# Patient Record
Sex: Female | Born: 1950
Health system: Southern US, Community
[De-identification: ages and names within clinical notes are randomized; demographics above are authoritative.]

## PROBLEM LIST (undated history)

## (undated) ENCOUNTER — Emergency Department (HOSPITAL_COMMUNITY): Admission: EM | Payer: Medicare Other | Source: Home / Self Care

## (undated) DIAGNOSIS — I739 Peripheral vascular disease, unspecified: Secondary | ICD-10-CM

## (undated) DIAGNOSIS — T7840XA Allergy, unspecified, initial encounter: Secondary | ICD-10-CM

## (undated) DIAGNOSIS — N189 Chronic kidney disease, unspecified: Secondary | ICD-10-CM

## (undated) DIAGNOSIS — H913 Deaf nonspeaking, not elsewhere classified: Secondary | ICD-10-CM

## (undated) DIAGNOSIS — E785 Hyperlipidemia, unspecified: Secondary | ICD-10-CM

## (undated) DIAGNOSIS — I639 Cerebral infarction, unspecified: Principal | ICD-10-CM

## (undated) DIAGNOSIS — I10 Essential (primary) hypertension: Secondary | ICD-10-CM

## (undated) HISTORY — PX: ABDOMINAL HYSTERECTOMY: SHX81

## (undated) HISTORY — DX: Hyperlipidemia, unspecified: E78.5

## (undated) HISTORY — DX: Chronic kidney disease, unspecified: N18.9

## (undated) HISTORY — DX: Allergy, unspecified, initial encounter: T78.40XA

## (undated) HISTORY — DX: Cerebral infarction, unspecified: I63.9

## (undated) HISTORY — DX: Peripheral vascular disease, unspecified: I73.9

## (undated) HISTORY — PX: CHOLECYSTECTOMY: SHX55

## (undated) HISTORY — DX: Essential (primary) hypertension: I10

## (undated) HISTORY — DX: Deaf nonspeaking, not elsewhere classified: H91.3

---

## 1998-01-17 ENCOUNTER — Encounter: Admission: RE | Admit: 1998-01-17 | Discharge: 1998-04-17 | Payer: Self-pay | Admitting: Internal Medicine

## 1998-06-01 ENCOUNTER — Emergency Department (HOSPITAL_COMMUNITY): Admission: EM | Admit: 1998-06-01 | Discharge: 1998-06-01 | Payer: Self-pay

## 1998-07-05 ENCOUNTER — Emergency Department (HOSPITAL_COMMUNITY): Admission: EM | Admit: 1998-07-05 | Discharge: 1998-07-05 | Payer: Self-pay | Admitting: Internal Medicine

## 1998-08-11 ENCOUNTER — Encounter: Admission: RE | Admit: 1998-08-11 | Discharge: 1998-11-09 | Payer: Self-pay | Admitting: Internal Medicine

## 1998-08-25 ENCOUNTER — Emergency Department (HOSPITAL_COMMUNITY): Admission: EM | Admit: 1998-08-25 | Discharge: 1998-08-25 | Payer: Self-pay | Admitting: Emergency Medicine

## 1998-09-21 ENCOUNTER — Emergency Department (HOSPITAL_COMMUNITY): Admission: EM | Admit: 1998-09-21 | Discharge: 1998-09-21 | Payer: Self-pay | Admitting: Emergency Medicine

## 1998-12-22 ENCOUNTER — Emergency Department (HOSPITAL_COMMUNITY): Admission: EM | Admit: 1998-12-22 | Discharge: 1998-12-22 | Payer: Self-pay | Admitting: Emergency Medicine

## 1999-01-03 ENCOUNTER — Emergency Department (HOSPITAL_COMMUNITY): Admission: EM | Admit: 1999-01-03 | Discharge: 1999-01-03 | Payer: Self-pay | Admitting: Emergency Medicine

## 2000-07-23 ENCOUNTER — Encounter: Admission: RE | Admit: 2000-07-23 | Discharge: 2000-10-21 | Payer: Self-pay

## 2002-02-20 ENCOUNTER — Emergency Department (HOSPITAL_COMMUNITY): Admission: EM | Admit: 2002-02-20 | Discharge: 2002-02-20 | Payer: Self-pay | Admitting: Emergency Medicine

## 2002-02-25 ENCOUNTER — Encounter: Admission: RE | Admit: 2002-02-25 | Discharge: 2002-02-25 | Payer: Self-pay | Admitting: Internal Medicine

## 2002-02-27 ENCOUNTER — Encounter: Admission: RE | Admit: 2002-02-27 | Discharge: 2002-05-28 | Payer: Self-pay | Admitting: *Deleted

## 2002-04-03 ENCOUNTER — Encounter: Admission: RE | Admit: 2002-04-03 | Discharge: 2002-04-03 | Payer: Self-pay | Admitting: Internal Medicine

## 2002-04-06 ENCOUNTER — Ambulatory Visit (HOSPITAL_COMMUNITY): Admission: RE | Admit: 2002-04-06 | Discharge: 2002-04-06 | Payer: Self-pay | Admitting: Internal Medicine

## 2002-04-06 ENCOUNTER — Encounter: Admission: RE | Admit: 2002-04-06 | Discharge: 2002-04-06 | Payer: Self-pay | Admitting: Internal Medicine

## 2002-04-06 ENCOUNTER — Encounter: Payer: Self-pay | Admitting: Internal Medicine

## 2002-04-20 ENCOUNTER — Encounter: Admission: RE | Admit: 2002-04-20 | Discharge: 2002-04-20 | Payer: Self-pay | Admitting: Internal Medicine

## 2002-05-04 ENCOUNTER — Ambulatory Visit (HOSPITAL_COMMUNITY): Admission: RE | Admit: 2002-05-04 | Discharge: 2002-05-04 | Payer: Self-pay | Admitting: Internal Medicine

## 2002-07-07 ENCOUNTER — Encounter: Admission: RE | Admit: 2002-07-07 | Discharge: 2002-07-07 | Payer: Self-pay | Admitting: Internal Medicine

## 2002-07-08 ENCOUNTER — Encounter: Admission: RE | Admit: 2002-07-08 | Discharge: 2002-07-08 | Payer: Self-pay | Admitting: Internal Medicine

## 2002-07-15 ENCOUNTER — Encounter: Payer: Self-pay | Admitting: Internal Medicine

## 2002-07-15 ENCOUNTER — Ambulatory Visit (HOSPITAL_COMMUNITY): Admission: RE | Admit: 2002-07-15 | Discharge: 2002-07-15 | Payer: Self-pay | Admitting: Internal Medicine

## 2002-08-21 ENCOUNTER — Encounter: Admission: RE | Admit: 2002-08-21 | Discharge: 2002-08-21 | Payer: Self-pay | Admitting: Internal Medicine

## 2002-10-15 HISTORY — PX: ROTATOR CUFF REPAIR: SHX139

## 2002-12-07 ENCOUNTER — Encounter: Admission: RE | Admit: 2002-12-07 | Discharge: 2002-12-07 | Payer: Self-pay | Admitting: Internal Medicine

## 2003-02-27 ENCOUNTER — Encounter: Payer: Self-pay | Admitting: Emergency Medicine

## 2003-02-27 ENCOUNTER — Inpatient Hospital Stay (HOSPITAL_COMMUNITY): Admission: AD | Admit: 2003-02-27 | Discharge: 2003-03-02 | Payer: Self-pay | Admitting: Orthopedic Surgery

## 2003-02-27 ENCOUNTER — Encounter: Payer: Self-pay | Admitting: Orthopedic Surgery

## 2003-03-04 ENCOUNTER — Encounter: Payer: Self-pay | Admitting: Emergency Medicine

## 2003-03-04 ENCOUNTER — Emergency Department (HOSPITAL_COMMUNITY): Admission: EM | Admit: 2003-03-04 | Discharge: 2003-03-04 | Payer: Self-pay | Admitting: Emergency Medicine

## 2003-07-22 ENCOUNTER — Ambulatory Visit (HOSPITAL_COMMUNITY): Admission: RE | Admit: 2003-07-22 | Discharge: 2003-07-22 | Payer: Self-pay | Admitting: Internal Medicine

## 2003-07-22 ENCOUNTER — Encounter: Payer: Self-pay | Admitting: Internal Medicine

## 2003-08-03 ENCOUNTER — Ambulatory Visit (HOSPITAL_BASED_OUTPATIENT_CLINIC_OR_DEPARTMENT_OTHER): Admission: RE | Admit: 2003-08-03 | Discharge: 2003-08-03 | Payer: Self-pay | Admitting: Orthopedic Surgery

## 2003-08-03 ENCOUNTER — Ambulatory Visit (HOSPITAL_COMMUNITY): Admission: RE | Admit: 2003-08-03 | Discharge: 2003-08-03 | Payer: Self-pay | Admitting: Orthopedic Surgery

## 2004-07-24 ENCOUNTER — Ambulatory Visit (HOSPITAL_COMMUNITY): Admission: RE | Admit: 2004-07-24 | Discharge: 2004-07-24 | Payer: Self-pay | Admitting: Internal Medicine

## 2004-10-02 ENCOUNTER — Ambulatory Visit: Payer: Self-pay | Admitting: Family Medicine

## 2004-10-12 ENCOUNTER — Ambulatory Visit: Payer: Self-pay | Admitting: Family Medicine

## 2005-01-10 ENCOUNTER — Ambulatory Visit: Payer: Self-pay | Admitting: Family Medicine

## 2005-05-25 ENCOUNTER — Ambulatory Visit: Payer: Self-pay | Admitting: Family Medicine

## 2005-09-19 ENCOUNTER — Ambulatory Visit: Payer: Self-pay | Admitting: Family Medicine

## 2005-09-20 ENCOUNTER — Ambulatory Visit (HOSPITAL_COMMUNITY): Admission: RE | Admit: 2005-09-20 | Discharge: 2005-09-20 | Payer: Self-pay | Admitting: Family Medicine

## 2005-11-21 ENCOUNTER — Ambulatory Visit: Payer: Self-pay | Admitting: Family Medicine

## 2005-11-28 ENCOUNTER — Ambulatory Visit: Payer: Self-pay | Admitting: Family Medicine

## 2006-01-17 ENCOUNTER — Ambulatory Visit: Payer: Self-pay | Admitting: Family Medicine

## 2006-07-10 ENCOUNTER — Ambulatory Visit: Payer: Self-pay | Admitting: Family Medicine

## 2006-07-23 ENCOUNTER — Ambulatory Visit: Payer: Self-pay | Admitting: Family Medicine

## 2006-09-03 ENCOUNTER — Ambulatory Visit: Payer: Self-pay | Admitting: Family Medicine

## 2006-09-24 ENCOUNTER — Ambulatory Visit (HOSPITAL_COMMUNITY): Admission: RE | Admit: 2006-09-24 | Discharge: 2006-09-24 | Payer: Self-pay | Admitting: Family Medicine

## 2007-04-10 ENCOUNTER — Emergency Department (HOSPITAL_COMMUNITY): Admission: EM | Admit: 2007-04-10 | Discharge: 2007-04-10 | Payer: Self-pay | Admitting: Emergency Medicine

## 2007-05-08 ENCOUNTER — Ambulatory Visit: Payer: Self-pay

## 2007-05-08 ENCOUNTER — Ambulatory Visit: Payer: Self-pay | Admitting: Family Medicine

## 2007-05-08 DIAGNOSIS — E785 Hyperlipidemia, unspecified: Secondary | ICD-10-CM

## 2007-05-08 DIAGNOSIS — G43009 Migraine without aura, not intractable, without status migrainosus: Secondary | ICD-10-CM | POA: Insufficient documentation

## 2007-05-08 DIAGNOSIS — M79609 Pain in unspecified limb: Secondary | ICD-10-CM

## 2007-05-08 DIAGNOSIS — E119 Type 2 diabetes mellitus without complications: Secondary | ICD-10-CM

## 2007-05-08 DIAGNOSIS — Z8679 Personal history of other diseases of the circulatory system: Secondary | ICD-10-CM | POA: Insufficient documentation

## 2007-05-08 DIAGNOSIS — M1990 Unspecified osteoarthritis, unspecified site: Secondary | ICD-10-CM | POA: Insufficient documentation

## 2007-05-08 DIAGNOSIS — I10 Essential (primary) hypertension: Secondary | ICD-10-CM

## 2007-07-02 ENCOUNTER — Emergency Department (HOSPITAL_COMMUNITY): Admission: EM | Admit: 2007-07-02 | Discharge: 2007-07-03 | Payer: Self-pay | Admitting: Emergency Medicine

## 2007-10-02 ENCOUNTER — Ambulatory Visit (HOSPITAL_COMMUNITY): Admission: RE | Admit: 2007-10-02 | Discharge: 2007-10-02 | Payer: Self-pay | Admitting: Family Medicine

## 2007-10-02 ENCOUNTER — Encounter: Payer: Self-pay | Admitting: Family Medicine

## 2007-10-27 ENCOUNTER — Ambulatory Visit: Payer: Self-pay | Admitting: Family Medicine

## 2007-10-31 ENCOUNTER — Ambulatory Visit: Payer: Self-pay | Admitting: Family Medicine

## 2007-11-05 LAB — CONVERTED CEMR LAB
Bilirubin, Direct: 0.1 mg/dL (ref 0.0–0.3)
Creatinine,U: 104.4 mg/dL
Eosinophils Absolute: 0.1 10*3/uL (ref 0.0–0.6)
Eosinophils Relative: 1.8 % (ref 0.0–5.0)
GFR calc Af Amer: 74 mL/min
GFR calc non Af Amer: 61 mL/min
Glucose, Bld: 373 mg/dL — ABNORMAL HIGH (ref 70–99)
HCT: 38.3 % (ref 36.0–46.0)
HDL: 39.2 mg/dL (ref >39.0)
Hgb A1c MFr Bld: 13.7 % — ABNORMAL HIGH (ref 4.6–6.0)
Lymphocytes Relative: 18.4 % (ref 12.0–46.0)
MCHC: 34 g/dL (ref 30.0–36.0)
MCV: 76.5 fL — ABNORMAL LOW (ref 78.0–100.0)
Microalb Creat Ratio: 131.2 mg/g — ABNORMAL HIGH (ref 0.0–30.0)
Microalb, Ur: 13.7 mg/dL — ABNORMAL HIGH (ref 0.0–1.9)
Neutro Abs: 5.7 10*3/uL (ref 1.4–7.7)
Neutrophils Relative %: 74.2 % (ref 43.0–77.0)
Potassium: 4.8 meq/L (ref 3.5–5.1)
Sodium: 138 meq/L (ref 135–145)
WBC: 7.6 10*3/uL (ref 4.5–10.5)

## 2008-02-12 ENCOUNTER — Ambulatory Visit: Payer: Self-pay | Admitting: Family Medicine

## 2008-02-12 DIAGNOSIS — J209 Acute bronchitis, unspecified: Secondary | ICD-10-CM | POA: Insufficient documentation

## 2008-02-16 ENCOUNTER — Encounter: Payer: Self-pay | Admitting: Family Medicine

## 2008-02-16 ENCOUNTER — Telehealth (INDEPENDENT_AMBULATORY_CARE_PROVIDER_SITE_OTHER): Payer: Self-pay | Admitting: *Deleted

## 2008-03-22 ENCOUNTER — Emergency Department (HOSPITAL_COMMUNITY): Admission: EM | Admit: 2008-03-22 | Discharge: 2008-03-22 | Payer: Self-pay | Admitting: Emergency Medicine

## 2008-03-26 ENCOUNTER — Ambulatory Visit: Payer: Self-pay | Admitting: Family Medicine

## 2008-03-26 DIAGNOSIS — N3000 Acute cystitis without hematuria: Secondary | ICD-10-CM | POA: Insufficient documentation

## 2008-03-26 DIAGNOSIS — K219 Gastro-esophageal reflux disease without esophagitis: Secondary | ICD-10-CM | POA: Insufficient documentation

## 2008-04-19 ENCOUNTER — Telehealth: Payer: Self-pay | Admitting: Family Medicine

## 2008-05-24 ENCOUNTER — Telehealth: Payer: Self-pay | Admitting: Family Medicine

## 2008-06-18 ENCOUNTER — Encounter: Payer: Self-pay | Admitting: Family Medicine

## 2008-07-29 ENCOUNTER — Emergency Department (HOSPITAL_COMMUNITY): Admission: EM | Admit: 2008-07-29 | Discharge: 2008-07-29 | Payer: Self-pay | Admitting: Family Medicine

## 2008-07-31 ENCOUNTER — Inpatient Hospital Stay (HOSPITAL_COMMUNITY): Admission: EM | Admit: 2008-07-31 | Discharge: 2008-08-03 | Payer: Self-pay | Admitting: Emergency Medicine

## 2008-07-31 ENCOUNTER — Ambulatory Visit: Payer: Self-pay | Admitting: Internal Medicine

## 2008-08-05 ENCOUNTER — Telehealth: Payer: Self-pay | Admitting: Family Medicine

## 2008-08-06 ENCOUNTER — Encounter: Payer: Self-pay | Admitting: Family Medicine

## 2008-08-10 ENCOUNTER — Ambulatory Visit: Payer: Self-pay | Admitting: Family Medicine

## 2008-08-27 ENCOUNTER — Ambulatory Visit: Payer: Self-pay | Admitting: Family Medicine

## 2008-09-22 ENCOUNTER — Encounter: Payer: Self-pay | Admitting: Family Medicine

## 2008-09-24 ENCOUNTER — Ambulatory Visit: Payer: Self-pay | Admitting: Family Medicine

## 2008-10-04 ENCOUNTER — Ambulatory Visit (HOSPITAL_COMMUNITY): Admission: RE | Admit: 2008-10-04 | Discharge: 2008-10-04 | Payer: Self-pay | Admitting: Family Medicine

## 2008-10-29 ENCOUNTER — Ambulatory Visit: Payer: Self-pay | Admitting: Family Medicine

## 2008-11-10 LAB — CONVERTED CEMR LAB
ALT: 14 units/L (ref 0–35)
AST: 24 units/L (ref 0–37)
Albumin: 3.2 g/dL — ABNORMAL LOW (ref 3.5–5.2)
Alkaline Phosphatase: 65 units/L (ref 39–117)
BUN: 8 mg/dL (ref 6–23)
Eosinophils Relative: 1.7 % (ref 0.0–5.0)
GFR calc Af Amer: 95 mL/min
Glucose, Bld: 204 mg/dL — ABNORMAL HIGH (ref 70–99)
HCT: 36.7 % (ref 36.0–46.0)
Hemoglobin: 12.2 g/dL (ref 12.0–15.0)
Monocytes Absolute: 0.6 10*3/uL (ref 0.1–1.0)
Monocytes Relative: 6.9 % (ref 3.0–12.0)
Platelets: 307 10*3/uL (ref 150–400)
Potassium: 4.3 meq/L (ref 3.5–5.1)
Total CHOL/HDL Ratio: 4.2
Total Protein: 7.1 g/dL (ref 6.0–8.3)
Triglycerides: 103 mg/dL (ref 0–149)
WBC: 8.2 10*3/uL (ref 4.5–10.5)

## 2008-11-22 ENCOUNTER — Encounter: Payer: Self-pay | Admitting: Family Medicine

## 2008-12-15 ENCOUNTER — Telehealth: Payer: Self-pay | Admitting: Family Medicine

## 2008-12-17 ENCOUNTER — Encounter: Payer: Self-pay | Admitting: Family Medicine

## 2008-12-21 ENCOUNTER — Encounter: Admission: RE | Admit: 2008-12-21 | Discharge: 2008-12-21 | Payer: Self-pay | Admitting: Endocrinology

## 2009-01-07 ENCOUNTER — Telehealth: Payer: Self-pay | Admitting: Family Medicine

## 2009-02-08 ENCOUNTER — Emergency Department (HOSPITAL_COMMUNITY): Admission: EM | Admit: 2009-02-08 | Discharge: 2009-02-08 | Payer: Self-pay | Admitting: Emergency Medicine

## 2009-02-09 ENCOUNTER — Ambulatory Visit: Payer: Self-pay | Admitting: Family Medicine

## 2009-02-09 LAB — CONVERTED CEMR LAB
Blood in Urine, dipstick: NEGATIVE
Nitrite: NEGATIVE
Urobilinogen, UA: 0.2

## 2009-02-11 LAB — CONVERTED CEMR LAB
ALT: 14 units/L (ref 0–35)
AST: 26 units/L (ref 0–37)
Alkaline Phosphatase: 84 units/L (ref 39–117)
Calcium: 9 mg/dL (ref 8.4–10.5)
Creatinine,U: 160.8 mg/dL
Eosinophils Relative: 1 % (ref 0.0–5.0)
GFR calc non Af Amer: 78.51 mL/min (ref >60)
HCT: 38.4 % (ref 36.0–46.0)
HDL: 48.4 mg/dL (ref >39.00)
Hemoglobin: 13 g/dL (ref 12.0–15.0)
Lymphocytes Relative: 19.6 % (ref 12.0–46.0)
Lymphs Abs: 2 10*3/uL (ref 0.7–4.0)
Microalb Creat Ratio: 257.5 mg/g — ABNORMAL HIGH (ref 0.0–30.0)
Monocytes Relative: 5.8 % (ref 3.0–12.0)
Neutro Abs: 7.5 10*3/uL (ref 1.4–7.7)
Platelets: 342 10*3/uL (ref 150.0–400.0)
Potassium: 3.9 meq/L (ref 3.5–5.1)
Sodium: 145 meq/L (ref 135–145)
TSH: 1.53 microintl units/mL (ref 0.35–5.50)
Total Bilirubin: 1 mg/dL (ref 0.3–1.2)
VLDL: 17.4 mg/dL (ref 0.0–40.0)
WBC: 10.3 10*3/uL (ref 4.5–10.5)

## 2009-02-18 ENCOUNTER — Ambulatory Visit: Payer: Self-pay | Admitting: Family Medicine

## 2009-02-18 DIAGNOSIS — H9190 Unspecified hearing loss, unspecified ear: Secondary | ICD-10-CM | POA: Insufficient documentation

## 2009-02-18 DIAGNOSIS — J45909 Unspecified asthma, uncomplicated: Secondary | ICD-10-CM | POA: Insufficient documentation

## 2009-03-15 ENCOUNTER — Ambulatory Visit: Payer: Self-pay | Admitting: Internal Medicine

## 2009-03-30 ENCOUNTER — Ambulatory Visit: Payer: Self-pay | Admitting: Internal Medicine

## 2009-03-30 HISTORY — PX: COLONOSCOPY: SHX174

## 2009-03-30 LAB — HM COLONOSCOPY

## 2009-04-29 ENCOUNTER — Ambulatory Visit: Payer: Self-pay | Admitting: Family Medicine

## 2009-04-29 DIAGNOSIS — R110 Nausea: Secondary | ICD-10-CM

## 2009-04-29 DIAGNOSIS — A088 Other specified intestinal infections: Secondary | ICD-10-CM

## 2009-04-29 LAB — CONVERTED CEMR LAB
Bilirubin Urine: NEGATIVE
Glucose, Urine, Semiquant: NEGATIVE
Ketones, urine, test strip: NEGATIVE
Specific Gravity, Urine: 1.01
Urobilinogen, UA: 0.2
pH: 6.5

## 2009-06-27 ENCOUNTER — Encounter: Payer: Self-pay | Admitting: Family Medicine

## 2009-08-10 ENCOUNTER — Emergency Department (HOSPITAL_COMMUNITY): Admission: EM | Admit: 2009-08-10 | Discharge: 2009-08-10 | Payer: Self-pay | Admitting: Emergency Medicine

## 2009-09-27 ENCOUNTER — Ambulatory Visit: Payer: Self-pay | Admitting: Family Medicine

## 2009-09-28 ENCOUNTER — Ambulatory Visit: Payer: Self-pay

## 2009-09-28 ENCOUNTER — Encounter: Payer: Self-pay | Admitting: Family Medicine

## 2009-09-30 LAB — CONVERTED CEMR LAB
Basophils Absolute: 0 10*3/uL (ref 0.0–0.1)
Calcium: 9.2 mg/dL (ref 8.4–10.5)
Eosinophils Relative: 1.5 % (ref 0.0–5.0)
GFR calc non Af Amer: 60.55 mL/min (ref >60)
Glucose, Bld: 50 mg/dL — ABNORMAL LOW (ref 70–99)
HCT: 37.2 % (ref 36.0–46.0)
Hemoglobin: 12.3 g/dL (ref 12.0–15.0)
Lymphocytes Relative: 16.6 % (ref 12.0–46.0)
Lymphs Abs: 2.3 10*3/uL (ref 0.7–4.0)
Monocytes Relative: 4.1 % (ref 3.0–12.0)
Neutro Abs: 10.7 10*3/uL — ABNORMAL HIGH (ref 1.4–7.7)
Potassium: 4.1 meq/L (ref 3.5–5.1)
RBC: 4.99 M/uL (ref 3.87–5.11)
RDW: 15.5 % — ABNORMAL HIGH (ref 11.5–14.6)
Sodium: 144 meq/L (ref 135–145)
WBC: 13.8 10*3/uL — ABNORMAL HIGH (ref 4.5–10.5)

## 2009-10-05 ENCOUNTER — Ambulatory Visit (HOSPITAL_COMMUNITY): Admission: RE | Admit: 2009-10-05 | Discharge: 2009-10-05 | Payer: Self-pay | Admitting: Family Medicine

## 2009-12-13 ENCOUNTER — Encounter: Payer: Self-pay | Admitting: Family Medicine

## 2009-12-28 ENCOUNTER — Telehealth: Payer: Self-pay | Admitting: Family Medicine

## 2010-01-04 ENCOUNTER — Ambulatory Visit: Payer: Self-pay | Admitting: Family Medicine

## 2010-01-26 ENCOUNTER — Encounter: Payer: Self-pay | Admitting: Speech Pathology

## 2010-02-07 ENCOUNTER — Ambulatory Visit: Payer: Self-pay | Admitting: Family Medicine

## 2010-02-07 DIAGNOSIS — M25569 Pain in unspecified knee: Secondary | ICD-10-CM | POA: Insufficient documentation

## 2010-02-09 ENCOUNTER — Telehealth: Payer: Self-pay | Admitting: Family Medicine

## 2010-02-09 ENCOUNTER — Encounter: Admission: RE | Admit: 2010-02-09 | Discharge: 2010-02-09 | Payer: Self-pay | Admitting: Family Medicine

## 2010-03-09 ENCOUNTER — Ambulatory Visit: Payer: Self-pay | Admitting: Sports Medicine

## 2010-03-09 DIAGNOSIS — R269 Unspecified abnormalities of gait and mobility: Secondary | ICD-10-CM

## 2010-03-10 ENCOUNTER — Encounter: Payer: Self-pay | Admitting: Family Medicine

## 2010-03-29 ENCOUNTER — Encounter: Admission: RE | Admit: 2010-03-29 | Discharge: 2010-04-24 | Payer: Self-pay | Admitting: Family Medicine

## 2010-04-14 ENCOUNTER — Ambulatory Visit: Payer: Self-pay | Admitting: Family Medicine

## 2010-04-26 ENCOUNTER — Encounter: Payer: Self-pay | Admitting: Sports Medicine

## 2010-04-28 ENCOUNTER — Ambulatory Visit (HOSPITAL_COMMUNITY): Admission: RE | Admit: 2010-04-28 | Discharge: 2010-04-28 | Payer: Self-pay | Admitting: Family Medicine

## 2010-05-03 ENCOUNTER — Encounter: Payer: Self-pay | Admitting: Sports Medicine

## 2010-05-08 ENCOUNTER — Ambulatory Visit: Payer: Self-pay | Admitting: Family Medicine

## 2010-06-06 ENCOUNTER — Encounter: Payer: Self-pay | Admitting: Family Medicine

## 2010-06-26 ENCOUNTER — Inpatient Hospital Stay (HOSPITAL_COMMUNITY)
Admission: EM | Admit: 2010-06-26 | Discharge: 2010-06-29 | Payer: Self-pay | Source: Home / Self Care | Admitting: Emergency Medicine

## 2010-07-12 ENCOUNTER — Encounter: Payer: Self-pay | Admitting: Family Medicine

## 2010-07-12 ENCOUNTER — Ambulatory Visit: Payer: Self-pay | Admitting: Family Medicine

## 2010-07-13 ENCOUNTER — Telehealth: Payer: Self-pay | Admitting: Family Medicine

## 2010-07-19 ENCOUNTER — Telehealth: Payer: Self-pay | Admitting: Family Medicine

## 2010-07-24 ENCOUNTER — Telehealth: Payer: Self-pay | Admitting: Family Medicine

## 2010-07-24 ENCOUNTER — Ambulatory Visit: Payer: Self-pay | Admitting: Family Medicine

## 2010-07-24 DIAGNOSIS — J069 Acute upper respiratory infection, unspecified: Secondary | ICD-10-CM | POA: Insufficient documentation

## 2010-07-25 ENCOUNTER — Encounter: Payer: Self-pay | Admitting: Family Medicine

## 2010-07-25 ENCOUNTER — Encounter
Admission: RE | Admit: 2010-07-25 | Discharge: 2010-09-19 | Payer: Self-pay | Source: Home / Self Care | Attending: Endocrinology | Admitting: Endocrinology

## 2010-08-04 ENCOUNTER — Ambulatory Visit: Payer: Self-pay | Admitting: Family Medicine

## 2010-08-14 ENCOUNTER — Encounter: Payer: Self-pay | Admitting: Family Medicine

## 2010-08-16 ENCOUNTER — Encounter: Payer: Self-pay | Admitting: Family Medicine

## 2010-08-17 ENCOUNTER — Telehealth: Payer: Self-pay | Admitting: *Deleted

## 2010-08-22 ENCOUNTER — Telehealth: Payer: Self-pay | Admitting: Family Medicine

## 2010-09-25 ENCOUNTER — Encounter: Payer: Self-pay | Admitting: Family Medicine

## 2010-10-10 ENCOUNTER — Ambulatory Visit (HOSPITAL_COMMUNITY)
Admission: RE | Admit: 2010-10-10 | Discharge: 2010-10-10 | Payer: Self-pay | Source: Home / Self Care | Attending: Family Medicine | Admitting: Family Medicine

## 2010-10-10 LAB — HM MAMMOGRAPHY

## 2010-11-05 ENCOUNTER — Inpatient Hospital Stay (HOSPITAL_COMMUNITY)
Admission: EM | Admit: 2010-11-05 | Discharge: 2010-11-13 | Payer: Self-pay | Source: Home / Self Care | Attending: Internal Medicine | Admitting: Internal Medicine

## 2010-11-05 DIAGNOSIS — I639 Cerebral infarction, unspecified: Secondary | ICD-10-CM

## 2010-11-05 HISTORY — DX: Cerebral infarction, unspecified: I63.9

## 2010-11-07 LAB — POCT CARDIAC MARKERS
CKMB, poc: <1 ng/mL — ABNORMAL LOW (ref 1.0–8.0)
Myoglobin, poc: 154 ng/mL (ref 12–200)
Troponin i, poc: <0.05 ng/mL (ref 0.00–0.09)

## 2010-11-07 LAB — BASIC METABOLIC PANEL
BUN: 9 mg/dL (ref 6–23)
Calcium: 9.2 mg/dL (ref 8.4–10.5)
Chloride: 103 mEq/L (ref 96–112)
Creatinine, Ser: 1.01 mg/dL (ref 0.4–1.2)
GFR calc non Af Amer: 56 mL/min — ABNORMAL LOW (ref >60)

## 2010-11-07 LAB — GLUCOSE, CAPILLARY
Glucose-Capillary: 148 mg/dL — ABNORMAL HIGH (ref 70–99)
Glucose-Capillary: 169 mg/dL — ABNORMAL HIGH (ref 70–99)
Glucose-Capillary: 148 mg/dL — ABNORMAL HIGH (ref 70–99)

## 2010-11-07 LAB — URINALYSIS, ROUTINE W REFLEX MICROSCOPIC
Bilirubin Urine: NEGATIVE
Hgb urine dipstick: NEGATIVE
Specific Gravity, Urine: 1.014 (ref 1.005–1.030)
Urine Glucose, Fasting: NEGATIVE mg/dL
pH: 6 (ref 5.0–8.0)

## 2010-11-07 LAB — CBC
HCT: 38.6 % (ref 36.0–46.0)
MCH: 23.9 pg — ABNORMAL LOW (ref 26.0–34.0)
MCV: 73.2 fL — ABNORMAL LOW (ref 78.0–100.0)
RBC: 5.27 MIL/uL — ABNORMAL HIGH (ref 3.87–5.11)
WBC: 9.1 10*3/uL (ref 4.0–10.5)

## 2010-11-07 LAB — LIPID PANEL
HDL: 37 mg/dL — ABNORMAL LOW (ref >39)
Total CHOL/HDL Ratio: 5.5 RATIO

## 2010-11-07 LAB — DIFFERENTIAL
Lymphocytes Relative: 16 % (ref 12–46)
Lymphs Abs: 1.4 10*3/uL (ref 0.7–4.0)
Monocytes Relative: 6 % (ref 3–12)
Neutrophils Relative %: 77 % (ref 43–77)

## 2010-11-07 LAB — URINE MICROSCOPIC-ADD ON

## 2010-11-07 LAB — TSH: TSH: 1.355 u[IU]/mL (ref 0.350–4.500)

## 2010-11-07 LAB — APTT: aPTT: 25 seconds (ref 24–37)

## 2010-11-07 LAB — TROPONIN I: Troponin I: 0.01 ng/mL (ref 0.00–0.06)

## 2010-11-07 LAB — PROTIME-INR: Prothrombin Time: 12.7 seconds (ref 11.6–15.2)

## 2010-11-08 LAB — FOLATE RBC: RBC Folate: 535 ng/mL (ref 180–600)

## 2010-11-08 LAB — URINE CULTURE

## 2010-11-08 LAB — CBC
HCT: 34.9 % — ABNORMAL LOW (ref 36.0–46.0)
Hemoglobin: 11.3 g/dL — ABNORMAL LOW (ref 12.0–15.0)
MCH: 23.8 pg — ABNORMAL LOW (ref 26.0–34.0)
MCHC: 32.4 g/dL (ref 30.0–36.0)

## 2010-11-08 LAB — GLUCOSE, CAPILLARY
Glucose-Capillary: 160 mg/dL — ABNORMAL HIGH (ref 70–99)
Glucose-Capillary: 168 mg/dL — ABNORMAL HIGH (ref 70–99)
Glucose-Capillary: 211 mg/dL — ABNORMAL HIGH (ref 70–99)

## 2010-11-08 LAB — URINALYSIS, ROUTINE W REFLEX MICROSCOPIC
Ketones, ur: NEGATIVE mg/dL
Protein, ur: NEGATIVE mg/dL
Urine Glucose, Fasting: NEGATIVE mg/dL
Urobilinogen, UA: 0.2 mg/dL (ref 0.0–1.0)

## 2010-11-08 LAB — BASIC METABOLIC PANEL
CO2: 25 mEq/L (ref 19–32)
Calcium: 8.6 mg/dL (ref 8.4–10.5)
Creatinine, Ser: 0.94 mg/dL (ref 0.4–1.2)
Glucose, Bld: 194 mg/dL — ABNORMAL HIGH (ref 70–99)

## 2010-11-09 LAB — GLUCOSE, CAPILLARY: Glucose-Capillary: 174 mg/dL — ABNORMAL HIGH (ref 70–99)

## 2010-11-10 LAB — BASIC METABOLIC PANEL
CO2: 26 mEq/L (ref 19–32)
Chloride: 101 mEq/L (ref 96–112)
Creatinine, Ser: 0.9 mg/dL (ref 0.4–1.2)
GFR calc Af Amer: >60 mL/min (ref >60)
Glucose, Bld: 152 mg/dL — ABNORMAL HIGH (ref 70–99)

## 2010-11-10 LAB — CBC
HCT: 37.6 % (ref 36.0–46.0)
Hemoglobin: 11.9 g/dL — ABNORMAL LOW (ref 12.0–15.0)
MCH: 23.3 pg — ABNORMAL LOW (ref 26.0–34.0)
MCV: 73.7 fL — ABNORMAL LOW (ref 78.0–100.0)
RBC: 5.1 MIL/uL (ref 3.87–5.11)

## 2010-11-10 LAB — GLUCOSE, CAPILLARY: Glucose-Capillary: 175 mg/dL — ABNORMAL HIGH (ref 70–99)

## 2010-11-11 LAB — GLUCOSE, CAPILLARY
Glucose-Capillary: 188 mg/dL — ABNORMAL HIGH (ref 70–99)
Glucose-Capillary: 206 mg/dL — ABNORMAL HIGH (ref 70–99)

## 2010-11-12 LAB — GLUCOSE, CAPILLARY
Glucose-Capillary: 167 mg/dL — ABNORMAL HIGH (ref 70–99)
Glucose-Capillary: 219 mg/dL — ABNORMAL HIGH (ref 70–99)
Glucose-Capillary: 115 mg/dL — ABNORMAL HIGH (ref 70–99)

## 2010-11-13 LAB — GLUCOSE, CAPILLARY: Glucose-Capillary: 141 mg/dL — ABNORMAL HIGH (ref 70–99)

## 2010-11-14 NOTE — Letter (Signed)
Summary: [place] [facility] [facility] form  [place] [facility] form   Imported By: Georgian Co 08/14/2010 14:32:15  _____________________________________________________________________  External Attachment:    Type:   Image     Comment:   External Document

## 2010-11-14 NOTE — Assessment & Plan Note (Signed)
Summary: NP POSSIBLE TORN KNEE TENDON/MJD   Vital Signs:  Patient profile:   60 year old female Height:      63 inches Weight:      186 pounds BMI:     33.07 BP sitting:   161 / 86  Vitals Entered By: Lillia Pauls CMA (February 07, 2010 10:21 AM)  History of Present Illness: Pt presents as a consultation from Dr. Clent Ridges for right knee pain that she has had since a fall in February of 2011 in which she twisted her knee. She cannot remember the date that she fell. She has felt as if her right knee could give out on her but denies locking or significant swelling. She has been using a walker per her PCP's recommendation which has been mildly helpful. She actually has not noticed a difference in her pain symptoms between using the walker vs. her normal cane. She has not used any form of brace or knee sleeve. Her pain radiates down into her right leg and often wakes her up from sleep. She has tried ibuprofen but it has not been very helpful.   Allergies: 1)  ! Penicillin  Physical Exam  General:  alert and well-developed.   Head:  normocephalic and atraumatic.   Ears:  Deaf bilateral ears Neck:  supple.   Lungs:  normal respiratory effort.   Msk:  Right Knee: No bony abnormalities, edema or bruising Full extension and flexion  + flexion pinch + TTP over the medial anterior joint line, medial posterior joint line and lateral anterior joint line Normal ACL, PCL, LCL and MCL with special testing Mildly positive McMurray's + Apply's + Bounce test with leg extended 4/5 strength with resisted knee flexion and extension Pain with weight bearing  Left Knee: Normal inspection, palpation, ROM and strength Normal ACL, PCL, MCL and LCL with special testing  Neg McMurray's and bounce test Bears weight easily   Impression & Recommendations:  Problem # 1:  KNEE PAIN, RIGHT (ICD-719.46) Concerning for osteoarthritis and a degenerative mensicus 1. Consented the patient for a steroid injection to  try to decrease inflammation in the joint and to decrease her pain. Consent obtained and verified. Sterile betadine prep. Furthur cleansed with alcohol. Topical analgesic spray: Ethyl chloride. Joint: Right Knee Approached in typical fashion with: Completed without difficulty Meds: 40mg  of Kenalog and 6 cc of 1% lidocaine Needle:25 gauge Aftercare instructions and Red flags advised.  2. Will obtain an x-ray to assess for OA 3. Given a knee home exercise program to do daily 4. Can ice for 20 minutes daily as needed 5. She can buy a hinged brace at [facility] to wear for more stability 6. Can use brace with cane if she has better stability 7. Return in one month   Her updated medication list for this problem includes:    Vicodin 5-500 Mg Tabs (Hydrocodone-acetaminophen) .Marland Kitchen... 1 every 6 hours as needed pain    Ibu 800 Mg Tabs (Ibuprofen) .Marland Kitchen... 1 tab by mouth three times a day as needed pain    Hydrocodone-acetaminophen 7.5-500 Mg Tabs (Hydrocodone-acetaminophen) .Marland Kitchen... 1 by mouth every 6 hours as needed for pain  Orders: Radiology other (Radiology Other) Joint Aspirate / Injection, Large (20610) Kenalog 10mg  (4units) (J3301)  Complete Medication List: 1)  Fexofenadine Hcl 180 Mg Tabs (Fexofenadine hcl) .... Once daily 2)  Atenolol 50 Mg Tabs (Atenolol) .... Once daily 3)  Vicodin 5-500 Mg Tabs (Hydrocodone-acetaminophen) .Marland Kitchen.. 1 every 6 hours as needed pain 4)  Proair  Hfa 108 (90 Base) Mcg/act Aers (Albuterol sulfate) .... 2 inh q4h as needed shortness of breath 5)  Protonix 40 Mg Pack (Pantoprazole sodium) .... One daily 6)  Ascensia Contour Test Strp (Glucose blood) .Marland Kitchen.. 1 by mouth two times a day 7)  Crestor 40 Mg Tabs (Rosuvastatin calcium) .Marland Kitchen.. 1 tablet by mouth daily 8)  Metformin Hcl 1000 Mg Tabs (Metformin hcl) .... Two times a day 9)  Avandia 4 Mg Tabs (Rosiglitazone maleate) .... Two times a day 10)  Singulair 10 Mg Tabs (Montelukast sodium) .Marland Kitchen.. 1 by mouth daily 11)   Omnaris 50 Mcg/act Susp (Ciclesonide) .... 2 sprays each nostril once daily 12)  Ibu 800 Mg Tabs (Ibuprofen) .Marland Kitchen.. 1 tab by mouth three times a day as needed pain 13)  Hydrocodone-acetaminophen 7.5-500 Mg Tabs (Hydrocodone-acetaminophen) .Marland Kitchen.. 1 by mouth every 6 hours as needed for pain 14)  Pravastatin Sodium 80 Mg Tabs (Pravastatin sodium) .Marland Kitchen.. 1 by mouth once daily 15)  Promethazine Hcl 25 Mg Tabs (Promethazine hcl) .Marland Kitchen.. 1 q 4 hours as needed nausea

## 2010-11-14 NOTE — Letter (Signed)
Summary: Generic Letter  [facility]  [address] [place], [place] [address]   Phone: [phone]  Fax: [phone]    01/26/2010  Valerie Rodriguez [address], [place]  [address]  Dear Ms. Bryner,   Dr. Clent Ridges has asked me to set up a Referral for you to see Dr. Thurston Hole at [facility]. Delbert Harness is requesting you to call them directly to set up this appt at: [phone] and speak with Rosalita Chessman.  Please call me if you have any questions.   Sincerely,   Corky Mull Patient Care Coordinator [phone] ext. [phone]

## 2010-11-14 NOTE — Assessment & Plan Note (Signed)
Summary: F/U,MC   Vital Signs:  Patient profile:   60 year old female BP sitting:   155 / 80  Vitals Entered By: Lillia Pauls CMA (April 14, 2010 8:50 AM)  History of Present Illness: 60 yo F here for f/u right knee pain  Patient had lateral fall with possible twisting injury prior to first visit Not a whole lot of swelling with that injury Has been limping since that time though with anterior medial > lateral knee pain No true locking but feels catching and instability Tried PT, steroid injection, oral nsaids, walking with walker - pain feels the same as before. X-rays done were reviewed - showed no evidence of fracture or DJD.  Allergies (verified): 1)  ! Penicillin  Physical Exam  Msk:  Knee: Normal to inspection with no erythema or effusion or obvious bony abnormalities. 1+ synovitis Medial > lateral joint line TTP.  Mild pes TTP.  No post patellar TTP. ROM normal in flexion and extension and lower leg rotation. Ligaments with solid consistent endpoints including ACL, PCL, LCL, MCL. Negative Mcmurray's. Non painful patellar compression. Patellar and quadriceps tendons unremarkable. Hamstring and quadriceps strength is normal.    Impression & Recommendations:  Problem # 1:  KNEE PAIN, RIGHT (ICD-719.46) Assessment Deteriorated Given not improving with injection, PT, nsaids, rest, will further assess for meniscal injury with MRI and contact patient with the result.  If noted, refer to ortho for consideration of arthroscopic debridement.  Her updated medication list for this problem includes:    Vicodin 5-500 Mg Tabs (Hydrocodone-acetaminophen) .Marland Kitchen... 1 every 6 hours as needed pain    Ibu 800 Mg Tabs (Ibuprofen) .Marland Kitchen... 1 tab by mouth three times a day as needed pain    Hydrocodone-acetaminophen 7.5-500 Mg Tabs (Hydrocodone-acetaminophen) .Marland Kitchen... 1 by mouth every 6 hours as needed for pain    Diclofenac Sodium 75 Mg Tbec (Diclofenac sodium) ..... One tab by mouth two times a  day as needed pain  Orders: MRI without Contrast (MRI w/o Contrast)  Complete Medication List: 1)  Fexofenadine Hcl 180 Mg Tabs (Fexofenadine hcl) .... Once daily 2)  Atenolol 50 Mg Tabs (Atenolol) .... Once daily 3)  Vicodin 5-500 Mg Tabs (Hydrocodone-acetaminophen) .Marland Kitchen.. 1 every 6 hours as needed pain 4)  Proair Hfa 108 (90 Base) Mcg/act Aers (Albuterol sulfate) .... 2 inh q4h as needed shortness of breath 5)  Protonix 40 Mg Pack (Pantoprazole sodium) .... One daily 6)  Ascensia Contour Test Strp (Glucose blood) .Marland Kitchen.. 1 by mouth two times a day 7)  Crestor 40 Mg Tabs (Rosuvastatin calcium) .Marland Kitchen.. 1 tablet by mouth daily 8)  Metformin Hcl 1000 Mg Tabs (Metformin hcl) .... Two times a day 9)  Avandia 4 Mg Tabs (Rosiglitazone maleate) .... Two times a day 10)  Singulair 10 Mg Tabs (Montelukast sodium) .Marland Kitchen.. 1 by mouth daily 11)  Omnaris 50 Mcg/act Susp (Ciclesonide) .... 2 sprays each nostril once daily 12)  Ibu 800 Mg Tabs (Ibuprofen) .Marland Kitchen.. 1 tab by mouth three times a day as needed pain 13)  Hydrocodone-acetaminophen 7.5-500 Mg Tabs (Hydrocodone-acetaminophen) .Marland Kitchen.. 1 by mouth every 6 hours as needed for pain 14)  Pravastatin Sodium 80 Mg Tabs (Pravastatin sodium) .Marland Kitchen.. 1 by mouth once daily 15)  Promethazine Hcl 25 Mg Tabs (Promethazine hcl) .Marland Kitchen.. 1 q 4 hours as needed nausea 16)  Diclofenac Sodium 75 Mg Tbec (Diclofenac sodium) .... One tab by mouth two times a day as needed pain  Patient Instructions: 1)  Given that you  are not improving with physical therapy, medicines, shots, and conservative therapy, we should investigate this further with an MRI to see if you tore cartilage in your knee - your x-rays looked good and do not show much arthritis at all in your knee which makes me more concerned about a cartilage tear. 2)  We will contact you with the results when they come back. 3)  Take vicodin as needed for severe pain - can split in half and take as well.  No driving on this medicine. 4)   MRI AT [facility] IS ON FRI, JULY 15TH AT 11AM Prescriptions: HYDROCODONE-ACETAMINOPHEN 7.5-500 MG TABS (HYDROCODONE-ACETAMINOPHEN) 1 by mouth every 6 hours as needed for pain  #40 x 0   Entered and Authorized by:   Norton Blizzard MD   Signed by:   Norton Blizzard MD on 04/14/2010   Method used:   Print then Give to Patient   RxID:   [id number]

## 2010-11-14 NOTE — Assessment & Plan Note (Signed)
Summary: 1 wk rov/njr ns/njr   Vital Signs:  Patient profile:   60 year old female O2 Sat:      94 % Temp:     97.9 degrees F Pulse rate:   88 / minute BP sitting:   130 / 82 Cuff size:   large  Vitals Entered By: Pura Spice, RN (August 04, 2010 10:15 AM) CC: 1 wk follow up doing better   FBS 91 Is Patient Diabetic? Yes Did you bring your meter with you today? No   History of Present Illness: Here with her daughter and a translator to follow up on DM. At our last visit she was supposed to switch from Lantus to Novolog Mix, but she decided to use up the rest of her Lantus first. She actually increased her dose of Lantus to 40 mg two times a day , and in fact her glucoses for the past 2 weeks are much better. These range from 90 to 180. She says she feels better. Her daughter says she has had several more falls at home, however, and she is worried about her. No apparent injuries.   Allergies: 1)  ! Penicillin  Past History:  Past Medical History: Reviewed history from 07/12/2010 and no changes required. 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 Antony Contras Asthma, sees Dr. Risingsun Callas  Review of Systems  The patient denies anorexia, fever, weight loss, weight gain, vision loss, decreased hearing, hoarseness, chest pain, syncope, dyspnea on exertion, peripheral edema, prolonged cough, headaches, hemoptysis, abdominal pain, melena, hematochezia, severe indigestion/heartburn, hematuria, incontinence, genital sores, muscle weakness, suspicious skin lesions, transient blindness, difficulty walking, depression, unusual weight change, abnormal bleeding, enlarged lymph nodes, angioedema, breast masses, and testicular masses.    Physical Exam  General:  alert, walking well with her walker Lungs:  Normal respiratory effort, chest expands symmetrically. Lungs are clear to auscultation, no crackles or wheezes. Heart:   Normal rate and regular rhythm. S1 and S2 normal without gallop, murmur, click, rub or other extra sounds.   Impression & Recommendations:  Problem # 1:  DIABETES MELLITUS, TYPE II (ICD-250.00)  Her updated medication list for this problem includes:    Metformin Hcl 1000 Mg Tabs (Metformin hcl) .Marland Kitchen..Marland Kitchen Two times a day    Novolog Mix 70/30 Flexpen 70-30 % Susp (Insulin aspart prot & aspart) ..... Use 20 units with breakfast, 10 units with lunch, and 20 units with dinner  Problem # 2:  HYPERTENSION (ICD-401.9)  Her updated medication list for this problem includes:    Atenolol 50 Mg Tabs (Atenolol) ..... Once daily  Problem # 3:  GAIT DISTURBANCE (ICD-781.2)  Orders: Home Health Referral (Home Health)  Complete Medication List: 1)  Fexofenadine Hcl 180 Mg Tabs (Fexofenadine hcl) .... Once daily 2)  Atenolol 50 Mg Tabs (Atenolol) .... Once daily 3)  Proair Hfa 108 (90 Base) Mcg/act Aers (Albuterol sulfate) .... 2 inh q4h as needed shortness of breath 4)  Protonix 40 Mg Pack (Pantoprazole sodium) .... One daily 5)  Ascensia Contour Test Strp (Glucose blood) .Marland Kitchen.. 1 by mouth two times a day 6)  Crestor 40 Mg Tabs (Rosuvastatin calcium) .Marland Kitchen.. 1 tablet by mouth daily 7)  Metformin Hcl 1000 Mg Tabs (Metformin hcl) .... Two times a day 8)  Singulair 10 Mg Tabs (Montelukast sodium) .Marland Kitchen.. 1 by mouth daily 9)  Omnaris 50 Mcg/act Susp (Ciclesonide) .... 2 sprays each nostril once daily 10)  Ibu 800 Mg Tabs (Ibuprofen) .Marland KitchenMarland KitchenMarland Kitchen  1 tab by mouth three times a day as needed pain 11)  Pravastatin Sodium 80 Mg Tabs (Pravastatin sodium) .Marland Kitchen.. 1 by mouth once daily 12)  Promethazine Hcl 25 Mg Tabs (Promethazine hcl) .Marland Kitchen.. 1 q 4 hours as needed nausea 13)  Diclofenac Sodium 75 Mg Tbec (Diclofenac sodium) .... One tab by mouth two times a day as needed pain 14)  Novolog Mix 70/30 Flexpen 70-30 % Susp (Insulin aspart prot & aspart) .... Use 20 units with breakfast, 10 units with lunch, and 20 units with  dinner  Patient Instructions: 1)  Starting today she will dispose of all her remaining Lantus and begin using Novolog Mix as we had discussed. She will follow up in one week. We will have a home health PT assess her ambulation skills at home soon, and see if getting therapy would help.    Orders Added: 1)  Est. Patient Level IV [[address]] 2)  Home Health Referral [facility]]

## 2010-11-14 NOTE — Progress Notes (Signed)
Summary: INFO ONLY  Phone Note Call from Patient   Caller: Patient   [phone] Summary of Call: Pt called through sign language interpreter services...Marland KitchenMarland Kitchen Pt adv that Dr Clent Ridges needs to fill out paperwork for her wheelchair (from [facility])?..... Unknown if paperwork has been received, Mrs Tensley will contact [employer] to have them re-fax same.... Fax # given, call ended.  Initial call taken by: Debbra Riding,  August 17, 2010 4:30 PM  Follow-up for Phone Call        I do not prescribe motorized wheelchairs. If she feels she needs one, this needs to be done by either an Orthopedist or a Rehab doctor  Follow-up by: Nelwyn Salisbury MD,  August 18, 2010 11:00 AM  Additional Follow-up for Phone Call Additional follow up Details #1::        Pt aware of this. Additional Follow-up by: Romualdo Bolk, CMA (AAMA),  August 18, 2010 11:36 AM

## 2010-11-14 NOTE — Progress Notes (Signed)
Summary: knee pain   Phone Note From Other Clinic   Caller: Tomi Bamberger [facility] [phone] Summary of Call: Tomi Bamberger from [facility] called to report pt having pt right knee  she think she is compensating from a fall at a [employer] that happened few months ago that she never told anyone about and never received any medical attention for and thinks may need to be seen or referred .  Initial call taken by: Pura Spice, RN,  August 22, 2010 10:49 AM  Follow-up for Phone Call        pt had appt on Nov  1 --no show  Follow-up by: Pura Spice, RN,  August 22, 2010 10:50 AM  Additional Follow-up for Phone Call Additional follow up Details #1::        notifeid Tomi Bamberger and informed her per dr fry request she needs to  be seen by Dr Eulah Pont and [place] Ar for ongoing knee pain .   attempted to notify pt no answer.  called relay operator [phone] mess left and requested pt call to confirm she received this mess  Additional Follow-up by: Pura Spice, RN,  August 22, 2010 10:57 AM    Additional Follow-up for Phone Call Additional follow up Details #2::    called Miguel Rota again and gave informed her I had tried numerous times in reaching pt and left mess but pt not returned phone call and requested for Olegario Messier to follow up with pt since she will be seening pt on Thurday .  Follow-up by: Pura Spice, RN,  August 22, 2010 4:42 PM

## 2010-11-14 NOTE — Progress Notes (Signed)
Summary: BS ELEVATED  Phone Note From Other Clinic   Caller: interim home care nurse-annie [phone] Call For: dr fry Summary of Call: pt bs today was 248 about 1 hr ago. please advise Initial call taken by: Heron Sabins,  July 24, 2010 1:05 PM  Follow-up for Phone Call        this is not an emergency. I just changed her insulin this morning so we need to give it time. Call if her glucose exceeds 300.  Follow-up by: Nelwyn Salisbury MD,  July 24, 2010 5:18 PM  Additional Follow-up for Phone Call Additional follow up Details #1::        LMOM advising. Additional Follow-up by: Trixie Dredge,  July 24, 2010 5:26 PM

## 2010-11-14 NOTE — Assessment & Plan Note (Signed)
Summary: UNCONTROLLED DM // RS   Vital Signs:  Patient profile:   60 year old female Weight:      183 pounds BMI:     32.53 O2 Sat:      95 % Temp:     98.7 degrees F Pulse rate:   110 / minute BP sitting:   120 / 80  (left arm) Cuff size:   large  Vitals Entered By: Pura Spice, RN (July 24, 2010 9:08 AM) CC: uncontrollable diabetes doing better saw dr Lurene Shadow 2 months ago.  FBS 123    History of Present Illness: Here to follow up on DM. She has been taking 40 units of Lantus every morning, and her glucoses have been all over the place. She gets lows in the 60s and 70s, but also gets peaks in the 500s. She says  she is watching her diet. She admits to skipping meals sometimes. Also 2 days ago she started having some PND and a dry cough. No fever.   Allergies: 1)  ! Penicillin  Past History:  Past Medical History: Reviewed history from 07/12/2010 and no changes required. 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 Antony Contras Asthma, sees Dr. Arjay Callas  Review of Systems  The patient denies anorexia, fever, weight loss, weight gain, vision loss, decreased hearing, hoarseness, chest pain, syncope, dyspnea on exertion, peripheral edema, headaches, hemoptysis, abdominal pain, melena, hematochezia, severe indigestion/heartburn, hematuria, incontinence, genital sores, muscle weakness, suspicious skin lesions, transient blindness, difficulty walking, depression, unusual weight change, abnormal bleeding, enlarged lymph nodes, angioedema, breast masses, and testicular masses.    Physical Exam  General:  overweight-appearing.  using a walker Head:  Normocephalic and atraumatic without obvious abnormalities. No apparent alopecia or balding. Eyes:  No corneal or conjunctival inflammation noted. EOMI. Perrla. Funduscopic exam benign, without hemorrhages, exudates or papilledema. Vision grossly normal. Ears:  External ear  exam shows no significant lesions or deformities.  Otoscopic examination reveals clear canals, tympanic membranes are intact bilaterally without bulging, retraction, inflammation or discharge. Hearing is grossly normal bilaterally. Nose:  External nasal examination shows no deformity or inflammation. Nasal mucosa are pink and moist without lesions or exudates. Mouth:  Oral mucosa and oropharynx without lesions or exudates.  Teeth in good repair. Neck:  No deformities, masses, or tenderness noted. Lungs:  Normal respiratory effort, chest expands symmetrically. Lungs are clear to auscultation, no crackles or wheezes. Heart:  Normal rate and regular rhythm. S1 and S2 normal without gallop, murmur, click, rub or other extra sounds.   Impression & Recommendations:  Problem # 1:  VIRAL URI (ICD-465.9)  Her updated medication list for this problem includes:    Fexofenadine Hcl 180 Mg Tabs (Fexofenadine hcl) ..... Once daily    Ibu 800 Mg Tabs (Ibuprofen) .Marland Kitchen... 1 tab by mouth three times a day as needed pain    Promethazine Hcl 25 Mg Tabs (Promethazine hcl) .Marland Kitchen... 1 q 4 hours as needed nausea    Diclofenac Sodium 75 Mg Tbec (Diclofenac sodium) ..... One tab by mouth two times a day as needed pain    Hydromet 5-1.5 Mg/31ml Syrp (Hydrocodone-homatropine) .Marland Kitchen... 1 tsp q 4 hours as needed cough  Complete Medication List: 1)  Fexofenadine Hcl 180 Mg Tabs (Fexofenadine hcl) .... Once daily 2)  Atenolol 50 Mg Tabs (Atenolol) .... Once daily 3)  Proair Hfa 108 (90 Base) Mcg/act Aers (Albuterol sulfate) .... 2 inh q4h as needed shortness of breath 4)  Protonix 40 Mg Pack (Pantoprazole sodium) .... One daily 5)  Ascensia Contour Test Strp (Glucose blood) .Marland Kitchen.. 1 by mouth two times a day 6)  Crestor 40 Mg Tabs (Rosuvastatin calcium) .Marland Kitchen.. 1 tablet by mouth daily 7)  Metformin Hcl 1000 Mg Tabs (Metformin hcl) .... Two times a day 8)  Singulair 10 Mg Tabs (Montelukast sodium) .Marland Kitchen.. 1 by mouth daily 9)  Omnaris 50  Mcg/act Susp (Ciclesonide) .... 2 sprays each nostril once daily 10)  Ibu 800 Mg Tabs (Ibuprofen) .Marland Kitchen.. 1 tab by mouth three times a day as needed pain 11)  Pravastatin Sodium 80 Mg Tabs (Pravastatin sodium) .Marland Kitchen.. 1 by mouth once daily 12)  Promethazine Hcl 25 Mg Tabs (Promethazine hcl) .Marland Kitchen.. 1 q 4 hours as needed nausea 13)  Diclofenac Sodium 75 Mg Tbec (Diclofenac sodium) .... One tab by mouth two times a day as needed pain 14)  Novolog Mix 70/30 Flexpen 70-30 % Susp (Insulin aspart prot & aspart) .... Use 20 units with breakfast, 10 units with lunch, and 20 units with dinner 15)  Hydromet 5-1.5 Mg/68ml Syrp (Hydrocodone-homatropine) .Marland Kitchen.. 1 tsp q 4 hours as needed cough  Patient Instructions: 1)  Switch from Lantus to Novolog  Mix three times a day before meals. Continue Metformin. Check glucose 4 times a day. Follow up in one week.  Prescriptions: HYDROMET 5-1.5 MG/5ML SYRP (HYDROCODONE-HOMATROPINE) 1 tsp q 4 hours as needed cough  #240 x 0   Entered and Authorized by:   Nelwyn Salisbury MD   Signed by:   Nelwyn Salisbury MD on 07/24/2010   Method used:   Print then Give to Patient   RxID:   [id number] NOVOLOG MIX 70/30 FLEXPEN 70-30 % SUSP (INSULIN ASPART PROT & ASPART) use 20 units with breakfast, 10 units with lunch, and 20 units with dinner  #1 x 11   Entered and Authorized by:   Nelwyn Salisbury MD   Signed by:   Nelwyn Salisbury MD on 07/24/2010   Method used:   Print then Give to Patient   RxID:   [id number]

## 2010-11-14 NOTE — Miscellaneous (Signed)
Summary: Revision to Plan of Care/[facility]  Revision to Plan of Care/[facility]   Imported By: Maryln Gottron 07/27/2010 11:23:54  _____________________________________________________________________  External Attachment:    Type:   Image     Comment:   External Document

## 2010-11-14 NOTE — Progress Notes (Signed)
Summary: confirmed appt   Phone Note Outgoing Call   Call placed by: gina hudy rn Summary of Call: per dr Beatriz Quintela request pt notified and noticed she had appt today and for Tues Oct 11. Pt was not aware of appt today and said her  was 97 and she feels fine. stated a family member must have made appt and didn't tell her. Instructed to her since feeling fine would see her on Tuesday and not today. Verbalized understanding.  Initial call taken by: Pura Spice, RN,  July 19, 2010 9:31 AM  Follow-up for Phone Call        agreed Follow-up by: Nelwyn Salisbury MD,  July 19, 2010 9:48 AM

## 2010-11-14 NOTE — Progress Notes (Signed)
Summary: pt req eval for wheelchair  Phone Note Call from Patient Call back at Home Phone [phone]   Caller: Patient Summary of Call: Pt is interested in getting eval for wheelchair. Pt needs to know what she has to do to get appt scheduled? Pt uses [facility].   Initial call taken by: Lucy Antigua,  December 28, 2009 3:28 PM  Follow-up for Phone Call        If she sets up an OV with me, I  would be happy to evaluate her for a manual wheelchair. But if she is interested in a motorized wheelchair, tell her that I do not order these. She would need to ask an Orthopedist, Rehab. doctor, etc. for this Follow-up by: Nelwyn Salisbury MD,  December 29, 2009 8:58 AM  Additional Follow-up for Phone Call Additional follow up Details #1::        I called pt and she said that she is interested in a motorized wheelchair and is wondering if you can give her a referral to an Orthopedist or Rehab doctor?   Additional Follow-up by: Lucy Antigua,  December 29, 2009 10:43 AM    Additional Follow-up for Phone Call Additional follow up Details #2::    What would this be for? I have never treated her for anything that would cause trouble walking Follow-up by: Nelwyn Salisbury MD,  December 29, 2009 11:40 AM  Additional Follow-up for Phone Call Additional follow up Details #3:: Details for Additional Follow-up Action Taken: Pt says she keeps falling a lot, ever since last year.  Pt didnt mention having any symptoms.    In that case, refer her to a Rehab specialist to evaluate for difficulty walking and falls  SF. I sch pt an ov for 01/04/10 at 8:45am to discuss pt falling down.  Pt aware that it is not an eval for wheelchair.  Additional Follow-up by: Lucy Antigua,  January 02, 2010 11:08 AM

## 2010-11-14 NOTE — Letter (Signed)
Summary: [facility]  [facility]   Imported By: Maryln Gottron 01/03/2010 09:36:41  _____________________________________________________________________  External Attachment:    Type:   Image     Comment:   External Document

## 2010-11-14 NOTE — Assessment & Plan Note (Signed)
Summary: CHANGE IN TREATMENT PER FIELDS,[facility]   Vital Signs:  Patient profile:   60 year old female Height:      63 inches BP sitting:   140 / 74  (left arm) Cuff size:   regular  Vitals Entered By: Tessie Fass CMA (May 08, 2010 4:11 PM) CC: F/U right knee   CC:  F/U right knee.  History of Present Illness: 60 yo F with pain in right knee, s/p injection 1 month ago.  She had some signs suggestive of meniscal pathology and an MRI was ordered to confirm.  MRI was negative with the exception of DJD.  Today pt says pain is completely gone with with injection.  She is quite happy.  She has no other complaints.    Interview conducted with sign language interpreter.  Current Medications (verified): 1)  Fexofenadine Hcl 180 Mg Tabs (Fexofenadine Hcl) .... Once Daily 2)  Atenolol 50 Mg  Tabs (Atenolol) .... Once Daily 3)  Vicodin 5-500 Mg  Tabs (Hydrocodone-Acetaminophen) .Marland Kitchen.. 1 Every 6 Hours As Needed Pain 4)  Proair Hfa 108 (90 Base) Mcg/act  Aers (Albuterol Sulfate) .... 2 Inh Q4h As Needed Shortness of Breath 5)  Protonix 40 Mg  Pack (Pantoprazole Sodium) .... One Daily 6)  Ascensia Contour Test   Strp (Glucose Blood) .Marland Kitchen.. 1 By Mouth Two Times A Day 7)  Crestor 40 Mg Tabs (Rosuvastatin Calcium) .Marland Kitchen.. 1 Tablet By Mouth Daily 8)  Metformin Hcl 1000 Mg Tabs (Metformin Hcl) .... Two Times A Day 9)  Avandia 4 Mg Tabs (Rosiglitazone Maleate) .... Two Times A Day 10)  Singulair 10 Mg Tabs (Montelukast Sodium) .Marland Kitchen.. 1 By Mouth Daily 11)  Omnaris 50 Mcg/act Susp (Ciclesonide) .... 2 Sprays Each Nostril Once Daily 12)  Ibu 800 Mg Tabs (Ibuprofen) .Marland Kitchen.. 1 Tab By Mouth Three Times A Day As Needed Pain 13)  Hydrocodone-Acetaminophen 7.5-500 Mg Tabs (Hydrocodone-Acetaminophen) .Marland Kitchen.. 1 By Mouth Every 6 Hours As Needed For Pain 14)  Pravastatin Sodium 80 Mg Tabs (Pravastatin Sodium) .Marland Kitchen.. 1 By Mouth Once Daily 15)  Promethazine Hcl 25 Mg Tabs (Promethazine Hcl) .Marland Kitchen.. 1 Q 4 Hours As Needed Nausea 16)   Diclofenac Sodium 75 Mg Tbec (Diclofenac Sodium) .... One Tab By Mouth Two Times A Day As Needed Pain  Allergies (verified): 1)  ! Penicillin  Review of Systems       See HPI  Physical Exam  General:  Well-developed,well-nourished,in no acute distress; alert,appropriate and cooperative throughout examination Msk:  Knee: R Normal to inspection with no erythema or effusion or obvious bony abnormalities. Palpation normal with no warmth or joint line tenderness or patellar tenderness or condyle tenderness. ROM normal in flexion and extension and lower leg rotation. Ligaments with solid consistent endpoints including ACL, PCL, LCL, MCL. Negative Mcmurray's and provocative meniscal tests. Non painful patellar compression. Patellar and quadriceps tendons unremarkable. Hamstring and quadriceps strength is normal.     Impression & Recommendations:  Problem # 1:  KNEE PAIN, RIGHT (ICD-719.46) Assessment Improved Knee pain completely resolved s/p injection.  Dx, mild DJD.  No further treatment.  RTC as needed.  Complete Medication List: 1)  Fexofenadine Hcl 180 Mg Tabs (Fexofenadine hcl) .... Once daily 2)  Atenolol 50 Mg Tabs (Atenolol) .... Once daily 3)  Proair Hfa 108 (90 Base) Mcg/act Aers (Albuterol sulfate) .... 2 inh q4h as needed shortness of breath 4)  Protonix 40 Mg Pack (Pantoprazole sodium) .... One daily 5)  Ascensia Contour Test Strp (Glucose blood) .Marland KitchenMarland KitchenMarland Kitchen  1 by mouth two times a day 6)  Crestor 40 Mg Tabs (Rosuvastatin calcium) .Marland Kitchen.. 1 tablet by mouth daily 7)  Metformin Hcl 1000 Mg Tabs (Metformin hcl) .... Two times a day 8)  Avandia 4 Mg Tabs (Rosiglitazone maleate) .... Two times a day 9)  Singulair 10 Mg Tabs (Montelukast sodium) .Marland Kitchen.. 1 by mouth daily 10)  Omnaris 50 Mcg/act Susp (Ciclesonide) .... 2 sprays each nostril once daily 11)  Ibu 800 Mg Tabs (Ibuprofen) .Marland Kitchen.. 1 tab by mouth three times a day as needed pain 12)  Pravastatin Sodium 80 Mg Tabs (Pravastatin sodium)  .Marland Kitchen.. 1 by mouth once daily 13)  Promethazine Hcl 25 Mg Tabs (Promethazine hcl) .Marland Kitchen.. 1 q 4 hours as needed nausea 14)  Diclofenac Sodium 75 Mg Tbec (Diclofenac sodium) .... One tab by mouth two times a day as needed pain

## 2010-11-14 NOTE — Discharge Summary (Signed)
NAMELASHINA, MILLES              ACCOUNT NO.:  [id number]  MEDICAL RECORD NO.:  [id number]          PATIENT TYPE:  INP  LOCATION:  3037                         FACILITY:  [facility]  PHYSICIAN:  [facility]      DATE OF BIRTH:  [birth year]  DATE OF ADMISSION:  11/05/2010 DATE OF DISCHARGE:                              DISCHARGE SUMMARY   PRIMARY CARE PHYSICIAN:  Tera Mater. Clent Ridges, MD  DISCHARGE DIAGNOSES: 1. Multiple falls. 2. Acute cerebrovascular accident on right posterior limb internal     capsule and right thalamus. 3. Distal left vertebral artery closure. 4. Nausea/vomiting, likely gastroparesis. 5. Hypertension. 6. Diabetes mellitus. 7. Hyperlipidemia. 8. Urinary tract infections, finished course of antibiotics. 9. Dehydration, improved. 10.History of asthma. 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  CONSULTATIONS:  Neurology, Pramod P. Pearlean Brownie, MD  BRIEF HISTORY OF PRESENT ILLNESS:  Ms. 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  The patient is also born deaf and communicates via sign language.  The patient had reported on the day of admission that she fell approximately 6 times a day prior to the admission with some episodes of dizziness or loss of balance.  She also endorsed some right leg weakness that led to these falls.  Otherwise, she denies any trippings or mechanical fall.  RADIOLOGICAL DATA:  Knee x-ray on November 05, 2010, no acute bony abnormality.  CT head without contrast on November 05, 2010, atrophy and chronic small-vessel disease, old lacunar infarcts in the basal ganglia and thalami, no acute or reversal findings.  MRI of the brain without contrast on November 07, 2010, showed small areas of acute infarction and posterior limb internal capsule on the right and right thalamus to moderate-to-advanced chronic ischemic changes.  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 Abdominal x-ray on November 08, 2010, nonspecific bowel gas pattern or no obstruction.  Chest x-ray on November 09, 2010, one chronically low lung volumes.  No definitive acute cardiopulmonary abnormality or lateral viewed would helpful if  suspicion for lower lobe pneumonia.  A 2-D echocardiogram November 07, 2010, showed EF of 60% to 65%, normal wall motion.  No regional wall motion abnormalities.  Carotid Dopplers on November 06, 2010, no ICA stenosis bilaterally.  Gastric emptying study pending at the time of dictation.  BRIEF HOSPITALIZATION COURSE PROBLEM LIST:  Ms. Cochrane is a 60 year old female who is congenital deaf and history of diabetes and hypertension, prior CVAs, is presented with multiple falls. 1. Multiple falls with dizziness/acute cerebrovascular accident.  The     patient was then admitted to the neurology floor.  Stroke workup     was initiated.  The patient's initial CT head was negative.  Given     her extreme claustrophobia, she was not able to tolerate MRI until     done with conscious sedation.  MRI was positive for small areas of     acute infarction and posterior limb internal capsule on the right     cans right thalamus as well as  occlusion of the distal left     vertebral artery on MRA.  Neurology was consulted and the patient     was already placed on aspirin and statins.  She has had physical     therapy evaluations done during the hospitalization and evaluated     by CIR/inpatient rehab.  The patient was declined by the inpatient     rehab and will be placed at skilled nursing facility. 2. Hypertension, somewhat uncontrolled.  During hospitalization, the     patient did require frequent adjustments in her blood pressure     medications and her BP readings have been improving. 3. Nausea and vomiting, unclear in etiology.  It was felt possibly     viral gastroenteritis.   However, given her history of diabetes     mellitus, gastric emptying study is being pursued today.  The     patient was placed on Reglan which has improved her symptoms. 4. Diabetes mellitus, also somewhat uncontrolled CBG readings.  The     patient was placed on Lantus insulin along with oral hypoglycemics     inpatient. 5. Urinary tract infection.  Urine culture obtained on November 05, 2010, showed more than 100,000 colonies of multiple bacterial     morphotypes.  Repeat urine culture on the antibiotics showed 85,000     colonies.  The patient has completed the course of antibiotics.  DISPOSITION:  The patient will be discharged to skilled nursing facility pending the gastric emptying study results as well as the bed available.  DISCHARGE MEDICATIONS:  It will be dictated at the time of actual discharge.     Thad Ranger, MD   ______________________________ [facility]    RR/MEDQ  D:  11/10/2010  T:  11/10/2010  Job:  [id number]  cc:   Jeannett Senior A. Clent Ridges, MD Pramod P. Pearlean Brownie, MD  Electronically Signed by Andres Labrum RAI  on 11/14/2010 04:52:36 PM

## 2010-11-14 NOTE — Letter (Signed)
Summary: [facility] Referral Form  [facility] Referral Form   Imported By: Marily Memos 03/10/2010 08:45:44  _____________________________________________________________________  External Attachment:    Type:   Image     Comment:   External Document

## 2010-11-14 NOTE — Discharge Summary (Signed)
Valerie Rodriguez, Valerie Rodriguez              ACCOUNT NO.:  [id number]  MEDICAL RECORD NO.:  [id number]          PATIENT TYPE:  INP  LOCATION:  3037                         FACILITY:  [facility]  PHYSICIAN:  Thad Ranger, MD       DATE OF BIRTH:  10-05-1951  DATE OF ADMISSION:  11/05/2010 DATE OF DISCHARGE:  11/13/2010                              DISCHARGE SUMMARY   ADDENDUM  The discharge was pending as the patient was awaiting skilled nursing facility placement and the results of gastric emptying study.  There has been no change in the management so far.  The previously dictated discharge diagnosis will remain same with addition to diabetic gastroparesis.  Additional radiological data, nuclear medicine, gastric emptying study on November 10, 2010, markedly delayed gastric emptying with only 2% transit of radiotracer from the stomach after 120 minutes.  PHYSICAL EXAMINATION:  VITAL SIGNS:  At the time of discharge, temperature 97.7, pulse 69, respirations 18, blood pressure 172/76, O2 sats 92% on room air. GENERAL:  The patient is alert, awake, and oriented x3, not in any acute distress. HEENT:  Anicteric sclerae and conjunctivae.  Pupils are reactive to light and accommodation, EOMI. NECK:  Supple.  No lymphadenopathy.  No JVD. CVS:  S1 and S2 clear.  Regular rate and rhythm. CHEST:  Clear to auscultation bilaterally. ABDOMEN:  Soft, nontender, nondistended.  Normal bowel sounds. EXTREMITIES:  No cyanosis, clubbing, or edema noted in upper or lower extremities bilaterally.  Strength 5/5 in upper bilateral extremities, 4/5 and symmetrical in both lower extremities.  No dysarthria or facial drooping noted.  DISCHARGE MEDICATIONS: 1. Aspirin 325 mg p.o. daily. 2. Tessalon Perles 100 mg p.o. t.i.d. 3. Dulcolax suppository 10 mg rectally daily as needed for     constipation. 4. Symbicort 1 puff inhaled twice daily. 5. Flonase 50 mcg 2 sprays nasally daily. 6. Hydralazine 50 mg p.o.  b.i.d. 7. Hycodan syrup 5 mL p.o. every 12 hours for cough. 8. NovoLog insulin 1-9 units subcu t.i.d. with meals sliding scale. 9. Lantus 5 units subcu daily at bedtime. 10.Combivent inhaler 2 puffs inhaled q.i.d. 11.Atenolol 100 mg p.o. daily. 12.Hydrocodone/APAP 7.5/500 mg 1 tablet every 8 hours as needed for     pain. 13.Lisinopril 40 mg p.o. daily. 14.Metformin 1000 mg p.o. b.i.d. with meals. 15.Phenergan 25 mg 1 tablet p.o. every 6 hours as needed for nausea. 16.Crestor 40 mg p.o. q.a.m. 17.Diclofenac 75 mg p.o. q.a.m. 18.Fexofenadine 180 mg p.o. twice daily as needed for allergies. 19.Omnaris 50 mcg 1 spray nasally q.a.m. as needed. 20.Reglan 5 mg p.o. before meals and at bedtime. 21.Singulair 10 mg p.o. daily at bedtime. 22.Xalatan eye drops 0.005% in both eyes daily at bedtime.  DISPOSITION:  The patient will be discharged to skilled nursing facility today.  DISCHARGE FOLLOWUP:  With Dr. Gershon Crane within the next 1-2 weeks and Dr. Pearlean Brownie, [facility] in 4 weeks.  DISCHARGE TIME:  Thirty five minutes.     Thad Ranger, MD     RR/MEDQ  D:  11/13/2010  T:  11/13/2010  Job:  [id number]  cc:   Jeannett Senior A. Clent Ridges, MD Pramod P.  Pearlean Brownie, MD  Electronically Signed by Andres Labrum RAI  on 11/14/2010 04:52:42 PM

## 2010-11-14 NOTE — Progress Notes (Signed)
Summary: Rx  Phone Note Call from Patient   Caller: Daughter, [name] Call For: Nelwyn Salisbury MD Summary of Call: mother needs Rx for glucometer- wants Breeze2 meter. ph- [phone] Initial call taken by: Raechel Ache, RN,  February 09, 2010 10:03 AM  Follow-up for Phone Call        done, in your box Follow-up by: Nelwyn Salisbury MD,  February 09, 2010 11:54 AM  Additional Follow-up for Phone Call Additional follow up Details #1::        Phone Call Completed Additional Follow-up by: Raechel Ache, RN,  February 09, 2010 12:07 PM

## 2010-11-14 NOTE — Assessment & Plan Note (Signed)
Summary: F/U,[facility]   Vital Signs:  Patient profile:   60 year old female BP sitting:   158 / 93  Vitals Entered By: Lillia Pauls CMA (Mar 09, 2010 3:17 PM)  History of Present Illness: Pt presents for follow-up of right knee pain. She was started on a home exercise program and received a steroid injection into her knee at her last visit. Unfortunately, she is still having some knee pain in addtion to lateral right hip pain. No recent injuries. She continues to walk with a walker. She still feels that the pain wakes her up from sleep at night. This pain though radiates down from her right hip. She does have point tenderness over the lateral aspect of her right hip. She has used Advil intermittently which has been helpful. She got x-rays done on her right knee at her last visit but has not gotten those results.   Allergies: 1)  ! Penicillin  Physical Exam  General:  alert and well-developed.  Accompanied by [name] who interprets. Head:  normocephalic and atraumatic.   Eyes:  vision grossly intact.   Ears:  Hearing impaired and signs.  Mouth:  MMM Neck:  supple and full ROM.   Lungs:  normal respiratory effort.   Msk:  Right Knee: No bony abnormalities, edema or bruising Full flexion and extension without pain + TTP over the medial joint line No TTP over the pes anserine bursa Neg flexion pinch, Neg McMurray's, Neg extended leg bounce test Normal MCL, LCL, ACL and PCL with special testing Walks with an antalgic gait using a walker 5/5 strength with resisted knee flexion and extension  Left Knee: No bony abnormalities, edema or bruising Full flexion and extension without pain No TTP along the joint line Neg McMurray's, extended leg bounce test Normal MCL, LCL, ACL and PCL with special testing 5/5 strength with resisted knee flexion and extension Favors walking on her left leg  Right Hip: Normal inspection + TTP over the trochanteric bursa IR of 25 degrees ER of 30  degrees  Left Hip: Normal inspection No TTP throughout IR of 25 degrees ER of 30 degrees  Equal leg lengths. Genus valgum when walking with the right knee   Impression & Recommendations:  Problem # 1:  KNEE PAIN, RIGHT (ICD-719.46) 1. Will stop her ibuprofen (she is not taking any prescription NSAIDS right now) and start on Voltaren 75mg  by mouth two times a day as needed pain. Counseled to take this with food and on days when her pain is worse than usual. Made sure that she is getting regular follow-up for her diabetes and that her renal function is normal at this time. (last creatinine 1.0 in 09/2009)  2. Referred for physical therapy for her right knee and hip. Will also have them check her balance 3. Would consider a heel wedge to make her suppinate her right foot more to take pressure off of the medial right knee 4. Will follow-up on her x-ray results. The office where she had the x-ray done was unable to give [place] those results because their computers were down. Will get in touch with [name] when those results are availabl.e 5. Return in one month  Her updated medication list for this problem includes:    Vicodin 5-500 Mg Tabs (Hydrocodone-acetaminophen) .Marland Kitchen... 1 every 6 hours as needed pain    Ibu 800 Mg Tabs (Ibuprofen) .Marland Kitchen... 1 tab by mouth three times a day as needed pain    Hydrocodone-acetaminophen 7.5-500 Mg Tabs (Hydrocodone-acetaminophen) .Marland KitchenMarland KitchenMarland KitchenMarland Kitchen  1 by mouth every 6 hours as needed for pain    Diclofenac Sodium 75 Mg Tbec (Diclofenac sodium) ..... One tab by mouth two times a day as needed pain  Problem # 2:  GAIT DISTURBANCE (ICD-781.2) Will refer for physical therapy  Complete Medication List: 1)  Fexofenadine Hcl 180 Mg Tabs (Fexofenadine hcl) .... Once daily 2)  Atenolol 50 Mg Tabs (Atenolol) .... Once daily 3)  Vicodin 5-500 Mg Tabs (Hydrocodone-acetaminophen) .Marland Kitchen.. 1 every 6 hours as needed pain 4)  Proair Hfa 108 (90 Base) Mcg/act Aers (Albuterol sulfate) .... 2 inh q4h  as needed shortness of breath 5)  Protonix 40 Mg Pack (Pantoprazole sodium) .... One daily 6)  Ascensia Contour Test Strp (Glucose blood) .Marland Kitchen.. 1 by mouth two times a day 7)  Crestor 40 Mg Tabs (Rosuvastatin calcium) .Marland Kitchen.. 1 tablet by mouth daily 8)  Metformin Hcl 1000 Mg Tabs (Metformin hcl) .... Two times a day 9)  Avandia 4 Mg Tabs (Rosiglitazone maleate) .... Two times a day 10)  Singulair 10 Mg Tabs (Montelukast sodium) .Marland Kitchen.. 1 by mouth daily 11)  Omnaris 50 Mcg/act Susp (Ciclesonide) .... 2 sprays each nostril once daily 12)  Ibu 800 Mg Tabs (Ibuprofen) .Marland Kitchen.. 1 tab by mouth three times a day as needed pain 13)  Hydrocodone-acetaminophen 7.5-500 Mg Tabs (Hydrocodone-acetaminophen) .Marland Kitchen.. 1 by mouth every 6 hours as needed for pain 14)  Pravastatin Sodium 80 Mg Tabs (Pravastatin sodium) .Marland Kitchen.. 1 by mouth once daily 15)  Promethazine Hcl 25 Mg Tabs (Promethazine hcl) .Marland Kitchen.. 1 q 4 hours as needed nausea 16)  Diclofenac Sodium 75 Mg Tbec (Diclofenac sodium) .... One tab by mouth two times a day as needed pain Prescriptions: DICLOFENAC SODIUM 75 MG TBEC (DICLOFENAC SODIUM) One tab by mouth two times a day as needed pain  #60 x 0   Entered and Authorized by:   Jannifer Rodney MD   Signed by:   Jannifer Rodney MD on 03/09/2010   Method used:   Electronically to        [facility] [phone]* (retail)       [address]       [place], [place]  [address]       Ph: [phone] or [phone]       Fax: [phone]   RxID:   [id number]

## 2010-11-14 NOTE — Assessment & Plan Note (Signed)
Summary: BS 400???/dm  Home health called but could not make out their...   Vital Signs:  Patient profile:   60 year old female Weight:      175 pounds O2 Sat:      96 % Temp:     98.1 degrees F Pulse rate:   100 / minute BP sitting:   142 / 92  (left arm) Cuff size:   regular  Vitals Entered By: Pura Spice, RN (July 12, 2010 4:11 PM) CC: BS 300 today  wants insulin refilled   History of Present Illness: Here to follow up after a hospital stay from 06-26-10 to 06-29-10 for uncontrolled DM, dehydration, and a UTI. She had been noncompliant with her insulin due to financial reasons, so she was sent home on Lantus. Her A1c at the hospital was 11.8. Now says she feels fine. Her glucoses are still high but not quite so extreme, averaging in the 200s this week. She has been taking all her oral meds as well.   Allergies: 1)  ! 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 Antony Contras Asthma, sees Dr. [place] Callas  Review of Systems  The patient denies anorexia, fever, weight loss, weight gain, vision loss, decreased hearing, hoarseness, chest pain, syncope, dyspnea on exertion, peripheral edema, prolonged cough, headaches, hemoptysis, abdominal pain, melena, hematochezia, severe indigestion/heartburn, hematuria, incontinence, genital sores, muscle weakness, suspicious skin lesions, transient blindness, difficulty walking, depression, unusual weight change, abnormal bleeding, enlarged lymph nodes, angioedema, breast masses, and testicular masses.    Physical Exam  General:  overweight-appearing.   Lungs:  Normal respiratory effort, chest expands symmetrically. Lungs are clear to auscultation, no crackles or wheezes. Heart:  Normal rate and regular rhythm. S1 and S2 normal without gallop, murmur, click, rub or other extra sounds.   Impression & Recommendations:  Problem  # 1:  DIABETES MELLITUS, TYPE II (ICD-250.00)  The following medications were removed from the medication list:    Avandia 4 Mg Tabs (Rosiglitazone maleate) .Marland Kitchen..Marland Kitchen Two times a day Her updated medication list for this problem includes:    Metformin Hcl 1000 Mg Tabs (Metformin hcl) .Marland Kitchen..Marland Kitchen Two times a day    Lantus Solostar 100 Unit/ml Soln (Insulin glargine) .Marland KitchenMarland KitchenMarland KitchenMarland Kitchen 50 units at bedtime  Complete Medication List: 1)  Fexofenadine Hcl 180 Mg Tabs (Fexofenadine hcl) .... Once daily 2)  Atenolol 50 Mg Tabs (Atenolol) .... Once daily 3)  Proair Hfa 108 (90 Base) Mcg/act Aers (Albuterol sulfate) .... 2 inh q4h as needed shortness of breath 4)  Protonix 40 Mg Pack (Pantoprazole sodium) .... One daily 5)  Ascensia Contour Test Strp (Glucose blood) .Marland Kitchen.. 1 by mouth two times a day 6)  Crestor 40 Mg Tabs (Rosuvastatin calcium) .Marland Kitchen.. 1 tablet by mouth daily 7)  Metformin Hcl 1000 Mg Tabs (Metformin hcl) .... Two times a day 8)  Singulair 10 Mg Tabs (Montelukast sodium) .Marland Kitchen.. 1 by mouth daily 9)  Omnaris 50 Mcg/act Susp (Ciclesonide) .... 2 sprays each nostril once daily 10)  Ibu 800 Mg Tabs (Ibuprofen) .Marland Kitchen.. 1 tab by mouth three times a day as needed pain 11)  Pravastatin Sodium 80 Mg Tabs (Pravastatin sodium) .Marland Kitchen.. 1 by mouth once daily 12)  Promethazine Hcl 25 Mg Tabs (Promethazine hcl) .Marland Kitchen.. 1 q 4 hours as needed nausea 13)  Diclofenac Sodium 75 Mg Tbec (Diclofenac sodium) .... One tab by mouth two times a day as needed pain  14)  Lantus Solostar 100 Unit/ml Soln (Insulin glargine) .... 50 units at bedtime  Patient Instructions: 1)  Stop Avandia. Increase Lantus as above.  2)  Please schedule a follow-up appointment in 2 weeks.  Prescriptions: LANTUS SOLOSTAR 100 UNIT/ML SOLN (INSULIN GLARGINE) 50 units at bedtime  #1 x 11   Entered and Authorized by:   Nelwyn Salisbury MD   Signed by:   Nelwyn Salisbury MD on 07/12/2010   Method used:   Electronically to        [facility] [address] [phone]* (retail)        [address]       [place], [place]  [address]       Ph: [phone] or [phone]       Fax: [phone]   RxID:   [id number]

## 2010-11-14 NOTE — Assessment & Plan Note (Signed)
Summary: CONSULT RE: FALLING A LOT/CJR   Vital Signs:  Patient profile:   60 year old female Weight:      184 pounds Temp:     98.2 degrees F oral BP sitting:   130 / 72  (right arm) Cuff size:   regular  Vitals Entered By: Raechel Ache, RN (January 04, 2010 9:01 AM) CC: Here for power chair eval.  FBS 81 @home . Is Patient Diabetic? Yes   History of Present Illness: Here with her daughter to evaluate her tendency to fall over the past several months. The daughter has not observed this, but according to the pt. she has fallen at home 3 or 4 times in the past few months. She cannot say if this is from dizziness or poor balance or weakness. She has a form to get a motorized scooter. She has been using a single point cane to get around.   Allergies: 1)  ! Penicillin  Past History:  Past Medical History: Reviewed history from 02/18/2009 and no changes required. Hypertension Diabetes mellitus, type II, sees Dr. 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 Antony Contras Asthma, sees Dr. [place] Callas  Past Surgical History: Cholecystectomy Hysterectomy 1976 Oophorectomy 1976 Repair torn rotator cuff 2004  colonoscopy 03-30-09 per Dr. Marina Goodell, clear but with a poor prep, repeat in one year  Review of Systems  The patient denies anorexia, fever, weight loss, weight gain, vision loss, decreased hearing, hoarseness, chest pain, syncope, dyspnea on exertion, peripheral edema, prolonged cough, headaches, hemoptysis, abdominal pain, melena, hematochezia, severe indigestion/heartburn, hematuria, incontinence, genital sores, muscle weakness, suspicious skin lesions, transient blindness, difficulty walking, depression, unusual weight change, abnormal bleeding, enlarged lymph nodes, angioedema, breast masses, and testicular masses.    Physical Exam  General:  alert and overweight-appearing.  She walks with a limp and favors the right leg. She can get up and down from the  exam table unassisted.  Lungs:  Normal respiratory effort, chest expands symmetrically. Lungs are clear to auscultation, no crackles or wheezes. Heart:  Normal rate and regular rhythm. S1 and S2 normal without gallop, murmur, click, rub or other extra sounds. Msk:  the right knee is very tender along the medial joint space and her ROM is limited by pain. No swelling is seen. There is crepitus. Anterior drawer is negative but McMurrays is very positive.  Neurologic:  strength normal in all extremities and sensation intact to light touch.     Impression & Recommendations:  Problem # 1:  LEG PAIN, RIGHT (ICD-729.5)  Orders: Orthopedic Surgeon Referral (Ortho Surgeon)  Problem # 2:  DEGENERATIVE JOINT DISEASE (ICD-715.90)  Her updated medication list for this problem includes:    Vicodin 5-500 Mg Tabs (Hydrocodone-acetaminophen) .Marland Kitchen... 1 every 6 hours as needed pain    Ibu 800 Mg Tabs (Ibuprofen) .Marland Kitchen... 1 tab by mouth three times a day as needed pain    Hydrocodone-acetaminophen 7.5-500 Mg Tabs (Hydrocodone-acetaminophen) .Marland Kitchen... 1 by mouth every 6 hours as needed for pain  Problem # 3:  HYPERTENSION (ICD-401.9)  Her updated medication list for this problem includes:    Atenolol 50 Mg Tabs (Atenolol) ..... Once daily  Complete Medication List: 1)  Fexofenadine Hcl 180 Mg Tabs (Fexofenadine hcl) .... Once daily 2)  Atenolol 50 Mg Tabs (Atenolol) .... Once daily 3)  Vicodin 5-500 Mg Tabs (Hydrocodone-acetaminophen) .Marland Kitchen.. 1 every 6 hours as needed pain 4)  Proair Hfa 108 (90 Base) Mcg/act Aers (Albuterol sulfate) .... 2 inh q4h as needed  shortness of breath 5)  Protonix 40 Mg Pack (Pantoprazole sodium) .... One daily 6)  Ascensia Contour Test Strp (Glucose blood) .Marland Kitchen.. 1 by mouth two times a day 7)  Crestor 40 Mg Tabs (Rosuvastatin calcium) .Marland Kitchen.. 1 tablet by mouth daily 8)  Metformin Hcl 1000 Mg Tabs (Metformin hcl) .... Two times a day 9)  Avandia 4 Mg Tabs (Rosiglitazone maleate) .... Two  times a day 10)  Singulair 10 Mg Tabs (Montelukast sodium) .Marland Kitchen.. 1 by mouth daily 11)  Omnaris 50 Mcg/act Susp (Ciclesonide) .... 2 sprays each nostril once daily 12)  Ibu 800 Mg Tabs (Ibuprofen) .Marland Kitchen.. 1 tab by mouth three times a day as needed pain 13)  Hydrocodone-acetaminophen 7.5-500 Mg Tabs (Hydrocodone-acetaminophen) .Marland Kitchen.. 1 by mouth every 6 hours as needed for pain 14)  Pravastatin Sodium 80 Mg Tabs (Pravastatin sodium) .Marland Kitchen.. 1 by mouth once daily 15)  Promethazine Hcl 25 Mg Tabs (Promethazine hcl) .Marland Kitchen.. 1 q 4 hours as needed nausea  Patient Instructions: 1)  It is clear that the major contributor to her difficulty walking is the right knee. She probably has advanced degenerative changes and possibly a cartilage tear. We will refer her to Orthopedics. She will wear a Neoprene sleeve. I wrote a rx for her to get a walker, and I encouraged her to use the walker at all times instead of the cane. I do not feel she is an appropriate candidate for a motorized scooter at this point.

## 2010-11-14 NOTE — Letter (Signed)
Summary: Diabetic Eye Exam/[facility]  Diabetic Eye Exam/[facility]   Imported By: Maryln Gottron 06/12/2010 13:27:52  _____________________________________________________________________  External Attachment:    Type:   Image     Comment:   External Document

## 2010-11-14 NOTE — Miscellaneous (Signed)
Summary: [facility]  [facility]   Imported By: Marily Memos 04/26/2010 16:21:48  _____________________________________________________________________  External Attachment:    Type:   Image     Comment:   External Document

## 2010-11-14 NOTE — Miscellaneous (Signed)
Summary: Certification and Plan of Care/[facility]  Certification and Plan of Care/[facility]   Imported By: Maryln Gottron 07/19/2010 14:19:39  _____________________________________________________________________  External Attachment:    Type:   Image     Comment:   External Document

## 2010-11-14 NOTE — Progress Notes (Signed)
Summary: HYPERGLYCEMIA CONCERNS (CALLER: ANNIE W/ [facility])  Phone Note From Other Clinic   Caller: Crosby Oyster ([facility])  [phone] Summary of Call: Pattricia Boss w/ [facility] called to speak with Dr Clent Ridges or RN about pts blood sugar.... Adv last reading was 497 on Tuesday / 327 yesterday  (pt was in for OV w/ Dr Clent Ridges yesterday)..... Would like to know what she can do for pt to help with Hyperglycemia?  Crosby Oyster w/ [facility] can be reached at [phone].  Initial call taken by: Debbra Riding,  July 13, 2010 4:33 PM  Follow-up for Phone Call        Dr. Kirtland Bouchard ....Dr. Clent Ridges out office today please advise.....  Follow-up by: Kathrynn Speed CMA,  July 13, 2010 4:59 PM  Additional Follow-up for Phone Call Additional follow up Details #1::        have patient come by office tomarrow to start short acting insulin Additional Follow-up by: Gordy Savers  MD,  July 13, 2010 5:11 PM    Additional Follow-up for Phone Call Additional follow up Details #2::    called phone number for Crosby Oyster, no answer. Called home number left msg, no answer, lft msg on reply service to call office in am to be seen  Follow-up by: Kathrynn Speed CMA,  July 13, 2010 5:15 PM  Additional Follow-up for Phone Call Additional follow up Details #3:: Details for Additional Follow-up Action Taken: agreed  Additional Follow-up by: Nelwyn Salisbury MD,  July 14, 2010 8:53 AM

## 2010-11-14 NOTE — Miscellaneous (Signed)
Summary: Revision to Plan of Care/[facility]  Revision to Plan of Care/[facility]   Imported By: Maryln Gottron 08/18/2010 15:48:41  _____________________________________________________________________  External Attachment:    Type:   Image     Comment:   External Document

## 2010-11-14 NOTE — Letter (Signed)
[facility]   Imported By: Marily Memos 05/04/2010 13:46:30  _____________________________________________________________________  External Attachment:    Type:   Image     Comment:   External Document

## 2010-11-16 NOTE — Miscellaneous (Signed)
Summary: Certification and Plan of Care/[facility]  Certification and Plan of Care/[facility]   Imported By: Maryln Gottron 09/27/2010 15:58:59  _____________________________________________________________________  External Attachment:    Type:   Image     Comment:   External Document

## 2010-11-18 NOTE — Consult Note (Signed)
Valerie Rodriguez, Valerie Rodriguez              ACCOUNT NO.:  [id number]  MEDICAL RECORD NO.:  [id number]           PATIENT TYPE:  LOCATION:                                 FACILITY:  PHYSICIAN:  Dorathea Faerber P. Pearlean Brownie, MD    DATE OF BIRTH:  February 02, 1951  DATE OF CONSULTATION: DATE OF DISCHARGE:                                CONSULTATION   REFERRING PHYSICIAN:  [facility].  REASON FOR REFERRAL:  Stroke.  HISTORY OF PRESENT ILLNESS:  Valerie Rodriguez is a 60 year old lady who has severe deafness who was admitted with recent fall and workup to shown a small right internal capsule, posterior limb, and lateral thalamus infarct.  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  She is deaf and can communicate only using her sign language interpreter who is currently not available at the bedside.  The patient's daughter works in the hospital, but when I called her at the time of consult she was busy and could not come.  History is hence obtained through review of the patient's chart and by my assistant, Felicie Morn, PAC.  PAST MEDICAL HISTORY:  Significant for previous strokes, congenital deafness, diabetes, hypertension, TIAs, and hyperlipidemia.  PAST SURGICAL HISTORY:  Right ankle surgery, gallbladder surgery, and hysterectomy.  HOME MEDICATIONS:  Aspirin, Crestor, metformin, Singulair, latanoprost, diclofenac, Vicodin, Reglan, lisinopril, atenolol, and Symbicort.  SOCIAL HISTORY:  The patient smokes 1 pack per day.  Does not do drugs. Lives alone.  PHYSICAL EXAMINATION:  GENERAL:  A middle-aged lady who is sitting comfortably in a chair. VITAL SIGNS:  She is currently afebrile.  Pulse rate 78 per minute and regular.  Blood pressure 195/82.  Respiratory rate 17 per minute. Temperature 98.7. HEAD:  Nontraumatic. NECK:  Supple.  There is no bruit.  She is severely deaf and can follow only signs.   She can speak, but her speech is very slow and can barely be heard. EYES:  Eye movements are full range.  There is minimum left lower facial asymmetry.  Tongue is midline.  She has no upper or lower extremity drift, however, left grip is weak.  Fine finger movements are diminished on the left.  She orbits right over left upper extremity.  Lower extremity strength is symmetric.  Plantars are both withdrawal and response.  DATA REVIEWED:  MRI scan of the brain showed small tiny infarcts in the right posterior limb, internal capsule, and lateral thalamus.  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  Two-D echo shows normal ejection fraction without cardiac source of embolism.  Carotid Doppler shows no significant extracranial stenosis.  Total cholesterol is 202, LDL is elevated at 139.  Hemoglobin A1c is elevated at 7.1.  IMPRESSION:  This is a 60 year old lady with previous history of strokes, transient ischemic attacks, and multiple falls who had a new right subcortical infarct secondary to small vessel disease.  Multiple vascular risk factors of diabetes, hyperlipidemia, hypertension, and previous strokes.  PLAN:  I would continue aspirin as the  patient was not on aspirin at the time of the stroke.  Maintain aggressive risk factor modification and strict control of hypertension with systolic blood pressure goal below 120/80 and diabetes with hemoglobin A1c goal below 6.5 and hyperlipidemia with LDL goal below 80.  Physical and occupational therapy consults.  I will be happy to follow the patient in consult. Kindly call for questions.     Alanny Rivers P. Pearlean Brownie, MD     PPS/MEDQ  D:  11/09/2010  T:  11/10/2010  Job:  [id number]  Electronically Signed by Delia Heady MD on 11/18/2010 01:13:35 PM

## 2010-11-23 ENCOUNTER — Encounter: Payer: Self-pay | Admitting: Family Medicine

## 2010-11-27 ENCOUNTER — Ambulatory Visit: Payer: Self-pay | Admitting: Family Medicine

## 2010-11-28 ENCOUNTER — Ambulatory Visit (INDEPENDENT_AMBULATORY_CARE_PROVIDER_SITE_OTHER): Payer: MEDICARE | Admitting: Family Medicine

## 2010-11-28 ENCOUNTER — Encounter: Payer: Self-pay | Admitting: Family Medicine

## 2010-11-28 VITALS — BP 112/76 | HR 68

## 2010-11-28 DIAGNOSIS — I639 Cerebral infarction, unspecified: Secondary | ICD-10-CM

## 2010-11-28 DIAGNOSIS — E119 Type 2 diabetes mellitus without complications: Secondary | ICD-10-CM

## 2010-11-28 DIAGNOSIS — I10 Essential (primary) hypertension: Secondary | ICD-10-CM

## 2010-11-28 DIAGNOSIS — I6350 Cerebral infarction due to unspecified occlusion or stenosis of unspecified cerebral artery: Secondary | ICD-10-CM

## 2010-11-28 MED ORDER — CLOPIDOGREL BISULFATE 75 MG PO TABS: 75.0000 mg | ORAL_TABLET | Freq: Every day | ORAL | Status: DC

## 2010-11-28 NOTE — Progress Notes (Signed)
  Subjective:    Patient ID: Valerie Rodriguez, female    DOB: [birth year], 60 y.o.   MRN: [id number]  HPI Here to follow up after a hospital stay from 11-05-10 to 11-13-10 for a stroke in the right internal capsule and right thalamus. Since her discharge she has been staying at [facility]. She has done well since then with some residual left leg weakness. She is getting some PT but they have not been training her on a walker yet. No new neurologic problems. Eating well. Her glucoses are running from 150-200. She will meet with a nutritionist in Dr. Janus Molder office in 2 weeks . She hopes to return home in about one month.    Review of Systems  Respiratory: Negative.   Cardiovascular: Negative.   Neurological: Positive for speech difficulty and weakness. Negative for dizziness, tremors, seizures, syncope, facial asymmetry, light-headedness, numbness and headaches.       Objective:   Physical Exam  Constitutional: She is oriented to person, place, and time. She appears well-developed and well-nourished.       Sitting in her wheelchair  Cardiovascular: Normal rate, regular rhythm, normal heart sounds and intact distal pulses.  Exam reveals no gallop and no friction rub.   No murmur heard. Pulmonary/Chest: Effort normal and breath sounds normal. No respiratory distress. She has no wheezes. She has no rales. She exhibits no tenderness.  Neurological: She is alert and oriented to person, place, and time.          Assessment & Plan:  She seems to be recovering well. Follow up with Dr. Horald Pollen for the DM. We will add Plavix to her aspirin to prevent further ischemic events. She should begin PT to teach her to use a walker soon.

## 2010-12-20 ENCOUNTER — Encounter: Admit: 2010-12-20 | Payer: Self-pay | Admitting: Endocrinology

## 2010-12-20 ENCOUNTER — Ambulatory Visit: Payer: Self-pay | Admitting: Dietician

## 2010-12-20 ENCOUNTER — Encounter: Payer: MEDICARE | Admitting: Dietician

## 2010-12-21 ENCOUNTER — Encounter: Payer: MEDICARE | Attending: Endocrinology | Admitting: Dietician

## 2010-12-21 DIAGNOSIS — Z713 Dietary counseling and surveillance: Secondary | ICD-10-CM | POA: Insufficient documentation

## 2010-12-21 DIAGNOSIS — E119 Type 2 diabetes mellitus without complications: Secondary | ICD-10-CM | POA: Insufficient documentation

## 2010-12-27 ENCOUNTER — Telehealth: Payer: Self-pay

## 2010-12-27 DIAGNOSIS — I639 Cerebral infarction, unspecified: Secondary | ICD-10-CM

## 2010-12-27 NOTE — Telephone Encounter (Signed)
Pt will call dr Horald Pollen but wants [facility] to do PT teaching for use of walker.

## 2010-12-27 NOTE — Telephone Encounter (Signed)
Needs nursing care called to [facility] [phone]

## 2010-12-27 NOTE — Telephone Encounter (Signed)
Daughter called wants rx for novolog pen and lancet States never had rx for the novolog pen  Call to [facility] [phone]

## 2010-12-28 ENCOUNTER — Telehealth: Payer: Self-pay | Admitting: *Deleted

## 2010-12-28 LAB — GLUCOSE, CAPILLARY
Glucose-Capillary: 124 mg/dL — ABNORMAL HIGH (ref 70–99)
Glucose-Capillary: 140 mg/dL — ABNORMAL HIGH (ref 70–99)
Glucose-Capillary: 170 mg/dL — ABNORMAL HIGH (ref 70–99)
Glucose-Capillary: 174 mg/dL — ABNORMAL HIGH (ref 70–99)
Glucose-Capillary: 238 mg/dL — ABNORMAL HIGH (ref 70–99)
Glucose-Capillary: 200 mg/dL — ABNORMAL HIGH (ref 70–99)
Glucose-Capillary: 210 mg/dL — ABNORMAL HIGH (ref 70–99)
Glucose-Capillary: 227 mg/dL — ABNORMAL HIGH (ref 70–99)
Glucose-Capillary: 239 mg/dL — ABNORMAL HIGH (ref 70–99)
Glucose-Capillary: 241 mg/dL — ABNORMAL HIGH (ref 70–99)
Glucose-Capillary: 241 mg/dL — ABNORMAL HIGH (ref 70–99)
Glucose-Capillary: 252 mg/dL — ABNORMAL HIGH (ref 70–99)
Glucose-Capillary: 255 mg/dL — ABNORMAL HIGH (ref 70–99)
Glucose-Capillary: 259 mg/dL — ABNORMAL HIGH (ref 70–99)
Glucose-Capillary: 267 mg/dL — ABNORMAL HIGH (ref 70–99)
Glucose-Capillary: 290 mg/dL — ABNORMAL HIGH (ref 70–99)
Glucose-Capillary: 332 mg/dL — ABNORMAL HIGH (ref 70–99)
Glucose-Capillary: 354 mg/dL — ABNORMAL HIGH (ref 70–99)
Glucose-Capillary: 355 mg/dL — ABNORMAL HIGH (ref 70–99)
Glucose-Capillary: 476 mg/dL — ABNORMAL HIGH (ref 70–99)
Glucose-Capillary: 581 mg/dL (ref 70–99)
Glucose-Capillary: >600 mg/dL (ref 70–99)
Glucose-Capillary: >600 mg/dL (ref 70–99)

## 2010-12-28 LAB — POCT I-STAT 3, VENOUS BLOOD GAS (G3P V)
Acid-base deficit: 2 mmol/L (ref 0.0–2.0)
Bicarbonate: 24.8 meq/L — ABNORMAL HIGH (ref 20.0–24.0)
O2 Saturation: 61 %
TCO2: 26 mmol/L (ref 0–100)
pCO2, Ven: 48.8 mmHg (ref 45.0–50.0)
pH, Ven: 7.314 — ABNORMAL HIGH (ref 7.250–7.300)
pO2, Ven: 35 mmHg (ref 30.0–45.0)

## 2010-12-28 LAB — COMPREHENSIVE METABOLIC PANEL
ALT: 15 U/L (ref 0–35)
ALT: 17 U/L (ref 0–35)
AST: 19 U/L (ref 0–37)
AST: 29 U/L (ref 0–37)
Albumin: 3 g/dL — ABNORMAL LOW (ref 3.5–5.2)
CO2: 23 mEq/L (ref 19–32)
CO2: 24 mEq/L (ref 19–32)
Calcium: 8.8 mg/dL (ref 8.4–10.5)
Calcium: 9.2 mg/dL (ref 8.4–10.5)
Creatinine, Ser: 0.91 mg/dL (ref 0.4–1.2)
GFR calc Af Amer: 45 mL/min — ABNORMAL LOW (ref >60)
GFR calc Af Amer: >60 mL/min (ref >60)
Sodium: 134 mEq/L — ABNORMAL LOW (ref 135–145)
Sodium: 139 mEq/L (ref 135–145)
Total Protein: 6.6 g/dL (ref 6.0–8.3)
Total Protein: 7 g/dL (ref 6.0–8.3)

## 2010-12-28 LAB — POCT I-STAT, CHEM 8
BUN: 6 mg/dL (ref 6–23)
Calcium, Ion: 1.07 mmol/L — ABNORMAL LOW (ref 1.12–1.32)
Chloride: 101 meq/L (ref 96–112)
Creatinine, Ser: 1.1 mg/dL (ref 0.4–1.2)
Glucose, Bld: >700 mg/dL (ref 70–99)
HCT: 44 % (ref 36.0–46.0)
Hemoglobin: 15 g/dL (ref 12.0–15.0)
Potassium: 3.8 meq/L (ref 3.5–5.1)
Sodium: 135 meq/L (ref 135–145)
TCO2: 24 mmol/L (ref 0–100)

## 2010-12-28 LAB — CBC
HCT: 38.1 % (ref 36.0–46.0)
HCT: 38.5 % (ref 36.0–46.0)
HCT: 41.2 % (ref 36.0–46.0)
Hemoglobin: 12.7 g/dL (ref 12.0–15.0)
Hemoglobin: 12.3 g/dL (ref 12.0–15.0)
Hemoglobin: 13.5 g/dL (ref 12.0–15.0)
MCH: 23.6 pg — ABNORMAL LOW (ref 26.0–34.0)
MCH: 23.4 pg — ABNORMAL LOW (ref 26.0–34.0)
MCH: 23.7 pg — ABNORMAL LOW (ref 26.0–34.0)
MCH: 23.9 pg — ABNORMAL LOW (ref 26.0–34.0)
MCHC: 33.3 g/dL (ref 30.0–36.0)
MCHC: 32.8 g/dL (ref 30.0–36.0)
MCV: 70.3 fL — ABNORMAL LOW (ref 78.0–100.0)
MCV: 72.4 fL — ABNORMAL LOW (ref 78.0–100.0)
Platelets: 238 10*3/uL (ref 150–400)
Platelets: 241 10*3/uL (ref 150–400)
Platelets: 282 K/uL (ref 150–400)
RBC: 5.39 MIL/uL — ABNORMAL HIGH (ref 3.87–5.11)
RBC: 5.25 MIL/uL — ABNORMAL HIGH (ref 3.87–5.11)
RBC: 5.69 MIL/uL — ABNORMAL HIGH (ref 3.87–5.11)
RDW: 14.7 % (ref 11.5–15.5)
RDW: 14.8 % (ref 11.5–15.5)
WBC: 9.4 10*3/uL (ref 4.0–10.5)
WBC: 10.4 K/uL (ref 4.0–10.5)
WBC: 10.6 10*3/uL — ABNORMAL HIGH (ref 4.0–10.5)
WBC: 8.9 10*3/uL (ref 4.0–10.5)

## 2010-12-28 LAB — DIFFERENTIAL
Basophils Absolute: 0 K/uL (ref 0.0–0.1)
Basophils Relative: 0 % (ref 0–1)
Eosinophils Absolute: 0.1 K/uL (ref 0.0–0.7)
Eosinophils Relative: 1 % (ref 0–5)
Lymphocytes Relative: 17 % (ref 12–46)
Lymphs Abs: 1.8 10*3/uL (ref 0.7–4.0)
Monocytes Absolute: 0.7 K/uL (ref 0.1–1.0)
Monocytes Relative: 7 % (ref 3–12)
Neutro Abs: 7.8 10*3/uL — ABNORMAL HIGH (ref 1.7–7.7)
Neutrophils Relative %: 75 % (ref 43–77)

## 2010-12-28 LAB — BASIC METABOLIC PANEL
BUN: 3 mg/dL — ABNORMAL LOW (ref 6–23)
CO2: 24 mEq/L (ref 19–32)
Chloride: 105 mEq/L (ref 96–112)
Chloride: 102 mEq/L (ref 96–112)
Creatinine, Ser: 0.78 mg/dL (ref 0.4–1.2)
Creatinine, Ser: 1.04 mg/dL (ref 0.4–1.2)
GFR calc Af Amer: >60 mL/min (ref >60)
Potassium: 3.1 mEq/L — ABNORMAL LOW (ref 3.5–5.1)

## 2010-12-28 LAB — BASIC METABOLIC PANEL WITH GFR
BUN: 6 mg/dL (ref 6–23)
Calcium: 9 mg/dL (ref 8.4–10.5)
GFR calc non Af Amer: 54 mL/min — ABNORMAL LOW (ref >60)
Glucose, Bld: 266 mg/dL — ABNORMAL HIGH (ref 70–99)
Sodium: 134 meq/L — ABNORMAL LOW (ref 135–145)

## 2010-12-28 LAB — COMPREHENSIVE METABOLIC PANEL WITH GFR
Albumin: 3.3 g/dL — ABNORMAL LOW (ref 3.5–5.2)
Alkaline Phosphatase: 89 U/L (ref 39–117)
BUN: 6 mg/dL (ref 6–23)
Chloride: 99 meq/L (ref 96–112)
Creatinine, Ser: 1.44 mg/dL — ABNORMAL HIGH (ref 0.4–1.2)
GFR calc non Af Amer: 37 mL/min — ABNORMAL LOW (ref >60)
Glucose, Bld: 702 mg/dL (ref 70–99)
Potassium: 4.2 meq/L (ref 3.5–5.1)
Total Bilirubin: 0.7 mg/dL (ref 0.3–1.2)

## 2010-12-28 LAB — CK TOTAL AND CKMB (NOT AT ARMC)
CK, MB: 0.8 ng/mL (ref 0.3–4.0)
CK, MB: 0.9 ng/mL (ref 0.3–4.0)
Relative Index: INVALID (ref 0.0–2.5)
Relative Index: INVALID (ref 0.0–2.5)
Total CK: 42 U/L (ref 7–177)
Total CK: 49 U/L (ref 7–177)
Total CK: 57 U/L (ref 7–177)

## 2010-12-28 LAB — KETONES, QUALITATIVE: Acetone, Bld: NEGATIVE

## 2010-12-28 LAB — URINALYSIS, ROUTINE W REFLEX MICROSCOPIC
Bilirubin Urine: NEGATIVE
Glucose, UA: >1000 mg/dL — AB
Hgb urine dipstick: NEGATIVE
Ketones, ur: NEGATIVE mg/dL
Nitrite: NEGATIVE
Protein, ur: NEGATIVE mg/dL
Specific Gravity, Urine: 1.026 (ref 1.005–1.030)
Urobilinogen, UA: 0.2 mg/dL (ref 0.0–1.0)
pH: 5.5 (ref 5.0–8.0)

## 2010-12-28 LAB — URINE CULTURE
Colony Count: 30000
Culture  Setup Time: 201109130817

## 2010-12-28 LAB — POCT CARDIAC MARKERS
CKMB, poc: <1 ng/mL — ABNORMAL LOW (ref 1.0–8.0)
Myoglobin, poc: 94 ng/mL (ref 12–200)
Troponin i, poc: <0.05 ng/mL (ref 0.00–0.09)

## 2010-12-28 LAB — URINE MICROSCOPIC-ADD ON

## 2010-12-28 LAB — TROPONIN I: Troponin I: 0.03 ng/mL (ref 0.00–0.06)

## 2010-12-28 LAB — HEMOGLOBIN A1C
Hgb A1c MFr Bld: 11.8 % — ABNORMAL HIGH (ref <5.7)
Mean Plasma Glucose: 292 mg/dL — ABNORMAL HIGH (ref <117)

## 2010-12-28 LAB — LIPID PANEL
Cholesterol: 128 mg/dL (ref 0–200)
HDL: 38 mg/dL — ABNORMAL LOW (ref >39)
LDL Cholesterol: 43 mg/dL (ref 0–99)
Total CHOL/HDL Ratio: 3.4 RATIO
Triglycerides: 237 mg/dL — ABNORMAL HIGH (ref <150)
VLDL: 47 mg/dL — ABNORMAL HIGH (ref 0–40)

## 2010-12-28 LAB — TSH: TSH: 2.346 u[IU]/mL (ref 0.350–4.500)

## 2010-12-28 NOTE — Telephone Encounter (Signed)
Agreed. Please order PT to instruct in the use of a walker (dx is stroke)

## 2010-12-28 NOTE — Telephone Encounter (Signed)
Reaction between Phenergan and Reglan.........Jan is the nurse.  Also needs nursing referral for pt, please.

## 2010-12-28 NOTE — Telephone Encounter (Signed)
Referral sent 

## 2011-01-04 NOTE — Telephone Encounter (Signed)
It is okay to use Phenergan as needed along with Reglan, they have my permission. Please ask why they need a "nursing referral"

## 2011-01-05 NOTE — Telephone Encounter (Signed)
Called Nurse, mental health and no one at home Called Jan RN wit [facility] who reports going to pt home now and said "daughter confused with pt med and wants "nursing referral"  . Informed Jan will let Dr Clent Ridges know but unsure if can get nursing referral for this since the daughter is confused with meds. Advised Jan to let daughter go to the drug store and have the pharmacist  Help her with the meds using a pill box.

## 2011-01-05 NOTE — Telephone Encounter (Signed)
Agreed -

## 2011-01-18 LAB — GLUCOSE, CAPILLARY
Glucose-Capillary: 263 mg/dL — ABNORMAL HIGH (ref 70–99)
Glucose-Capillary: 311 mg/dL — ABNORMAL HIGH (ref 70–99)

## 2011-01-18 LAB — POCT I-STAT 3, ART BLOOD GAS (G3+)
Acid-base deficit: 1 mmol/L (ref 0.0–2.0)
O2 Saturation: 93 %
Patient temperature: 97.4

## 2011-01-18 LAB — URINALYSIS, ROUTINE W REFLEX MICROSCOPIC
Bilirubin Urine: NEGATIVE
Glucose, UA: 500 mg/dL — AB
Protein, ur: >300 mg/dL — AB
Urobilinogen, UA: 0.2 mg/dL (ref 0.0–1.0)

## 2011-01-18 LAB — CBC
HCT: 42.8 % (ref 36.0–46.0)
MCV: 74.6 fL — ABNORMAL LOW (ref 78.0–100.0)
Platelets: 327 10*3/uL (ref 150–400)
RDW: 16.5 % — ABNORMAL HIGH (ref 11.5–15.5)

## 2011-01-18 LAB — URINE MICROSCOPIC-ADD ON

## 2011-01-18 LAB — COMPREHENSIVE METABOLIC PANEL
AST: 23 U/L (ref 0–37)
Albumin: 3.8 g/dL (ref 3.5–5.2)
BUN: 14 mg/dL (ref 6–23)
Calcium: 9.6 mg/dL (ref 8.4–10.5)
Creatinine, Ser: 1.01 mg/dL (ref 0.4–1.2)
GFR calc Af Amer: >60 mL/min (ref >60)
Total Protein: 8.3 g/dL (ref 6.0–8.3)

## 2011-01-18 LAB — DIFFERENTIAL
Basophils Absolute: 0 10*3/uL (ref 0.0–0.1)
Lymphocytes Relative: 7 % — ABNORMAL LOW (ref 12–46)
Lymphs Abs: 0.9 10*3/uL (ref 0.7–4.0)
Monocytes Absolute: 0.4 10*3/uL (ref 0.1–1.0)
Monocytes Relative: 3 % (ref 3–12)
Neutro Abs: 12.6 10*3/uL — ABNORMAL HIGH (ref 1.7–7.7)

## 2011-01-22 ENCOUNTER — Telehealth: Payer: Self-pay

## 2011-01-22 DIAGNOSIS — I639 Cerebral infarction, unspecified: Secondary | ICD-10-CM

## 2011-01-22 LAB — GLUCOSE, CAPILLARY: Glucose-Capillary: 257 mg/dL — ABNORMAL HIGH (ref 70–99)

## 2011-01-22 MED ORDER — CLOPIDOGREL BISULFATE 75 MG PO TABS: 75.0000 mg | ORAL_TABLET | Freq: Every day | ORAL | Status: DC

## 2011-01-22 MED ORDER — FLUTICASONE PROPIONATE 50 MCG/ACT NA SUSP: 2.0000 | Freq: Every day | NASAL | Status: DC

## 2011-01-22 MED ORDER — HYDRALAZINE HCL 50 MG PO TABS: 50.0000 mg | ORAL_TABLET | Freq: Two times a day (BID) | ORAL | Status: DC

## 2011-01-22 NOTE — Telephone Encounter (Signed)
Refills sent to [facility]

## 2011-01-31 ENCOUNTER — Encounter: Payer: MEDICARE | Admitting: Dietician

## 2011-02-27 NOTE — Discharge Summary (Signed)
Valerie, Rodriguez              ACCOUNT NO.:  [id number]   MEDICAL RECORD NO.:  [id number]          PATIENT TYPE:  INP   LOCATION:  6708                         FACILITY:  [facility]   PHYSICIAN:  Valerie A. Felicity Coyer, MDDATE OF BIRTH:  04-15-1951   DATE OF ADMISSION:  07/31/2008  DATE OF DISCHARGE:  08/03/2008                               DISCHARGE SUMMARY   DISCHARGE DIAGNOSES:  1. Uncontrolled diabetes type 2 with mild decay versus hyperosmolar      nonketotic.  2. Hypokalemia.  3. Dyslipidemia.  4. Hypertension.  5. Asthma.  6. Abdominal pain, resolved.   HISTORY OF PRESENT ILLNESS:  [name] is a 60 year old white female  who was admitted on July 31, 2008, with chief complaint of vomiting.  She noted gradual onset with worsening throughout the day of vomiting.  Illness is accompanied by lower abdominal pain.  She denied hematemesis.  She did have chills, however, had no fever.  She noted cough, which is  productive of green sputum.  She also noted being more short of breath  than usual and more weak than usual.  She is unable to keep down her  medications on day of admission.  She was admitted for further  evaluation and treatment.  1. Diabetes type 2.  2. Hypertension.  3. Asthma.  4. Hyperlipidemia.   COURSE OF HOSPITALIZATION:  1. Diabetes type 2, uncontrolled.  Her bicarb was normal on admission,      however, she did have an anion gap of 18.  Upon further discussion      with the patient's daughter, she said that the patient is on fixed      income and has difficulty affording her medications.  The patient      stated that she had not been taking Avandamet at all at home prior      to admission, although the patient denied that cost is an issue,      her daughter affirmed that this would likely be the reason why she      was not taking her medication.  She was started on 5 units of      Lantus during this admission and at that time she did have episodes      of  hypoglycemia.  Her A1c was noted to be very elevated at 12.1.      At this time, Lantus has been discontinued and the patient has been      placed on metformin and glyburide both of which are available on      the 4-dollar [employer] plan.  Her blood sugars since initiating these      medications have ranged 179 to 216.  She will continue close      outpatient monitoring.  At this time, we will try to avoid      initiation of home insulin as the Lantus is very expensive and      suspect the compliance will be more difficult for the patient,      however, should her sugars not become well controlled on the oral  regimen may need to consider adding insulin down the road.  We will      defer to the patient's primary MD.  We have asked the diabetes      coordinator to assist in arranging outpatient diabetes education      classes.  She will need to have a sign language interpreter, which      makes this little bit more difficult.  She has been provided with      instructions on diabetic diet by nursing nutrition staff this      admission and been given instructions in writing on parameters for      calling Dr. Clent Ridges and need for ongoing CBG monitoring and to record      these values and bring them to her appointment.   In addition as cost seems to be barrier for this patient, we have also  changed her statin from simvastatin to pravastatin, which is on the  [employer] plan.  Her enalapril and atenolol are already available on the  [employer] plan, however, her enalapril has been increased from 20 to 40 mg  during this admission as her blood pressure was uncontrolled.   DISCHARGE MEDICATIONS:  1. Lasix 20 mg p.o. daily.  2. Protonix 40 mg p.o. daily.  3. Atenolol 50 mg p.o. daily.  4. Detrol LA 4 mg p.o. daily.  5. Ventolin 2 puffs every 6 hours as needed.  6. Vicodin 5/500 one tablet p.o. q.6 h. as needed.  7. Enalapril 40 mg p.o. daily.  8. Pravastatin 40 mg p.o. daily.  9. Metformin 1000 mg  p.o. b.i.d.  10.Glyburide 5 mg p.o. daily.   PERTINENT LABORATORIES AT THE TIME OF DISCHARGE:  BUN 8, creatinine  0.84, hemoglobin 14.2, and hematocrit 43.8.   DISPOSITION:  The patient will be discharged to home.   FOLLOWUP:  She is instructed to follow up with Dr. Gershon Crane on  August 10, 2008, at 1 o'clock p.m.   DIET:  She is to be maintained on a low-sodium heart-healthy diabetic  diet.   SPECIAL INSTRUCTIONS:  She is instructed to call Dr. Clent Ridges should her  sugar be greater than 300 or less than 80.  Should she have a sugar less  than 80, she is to drink juice and have a snack and contact Dr. Clent Ridges for  further instructions.  She is also instructed to check her sugars twice  daily and record in a notebook with time.  She is to bring this  information with her to her appointments with Dr. Clent Ridges.  She is also  instructed to avoid concentrated sweets such as juice, soda, and candies  and to substitute whole grains whenever possible.  Prescriptions had  been provided for enalapril, atenolol, glyburide, metformin, and  Prevacid now, which are available on the [employer] for dollar  month plan.   Greater than 30 minutes was spent on discharge planning.      Sandford Craze, NP      Raenette Rover. Felicity Coyer, MD  Electronically Signed    MO/MEDQ  D:  08/03/2008  T:  08/03/2008  Job:  [id number]   cc:   Jeannett Senior A. Clent Ridges, MD

## 2011-02-27 NOTE — H&P (Signed)
NAMEJENAVEVE, FENSTERMAKER              ACCOUNT NO.:  [id number]   MEDICAL RECORD NO.:  [id number]          PATIENT TYPE:  INP   LOCATION:  6708                         FACILITY:  [facility]   PHYSICIAN:  Corinna L. Lendell Caprice, MDDATE OF BIRTH:  10/03/1951   DATE OF ADMISSION:  07/31/2008  DATE OF DISCHARGE:                              HISTORY & PHYSICAL   CHIEF COMPLAINT:  Vomiting.   HPI:  Ms. 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 Clent Ridges  who presents with gradual onset worsening vomiting today.  She has some  lower abdominal pain.  She has had no hematemesis.  She has had chills.  No fevers.  She has had a cough productive of green sputum.  She has no  sore throat.  She has rhinorrhea.  She reports that her sugars usually  run high, above 200.  There is a sign language interpreter here who is  assisting.  She also has been more short of breath than usual.  She  feels weak.  She has been unable to keep down her medications today.  No  rash.  No recent travel.   PAST MEDICAL HISTORY:  1. Type 2 diabetes.  2. Hypertension.  3. Asthma.  4. Hyperlipidemia.  5. Deafness.   MEDICATIONS:  1. Furosemide 20 mg a day.  2. Avandamet 01/999 b.i.d.  3. Xyzal nightly.  4. Singulair 10 mg a day.  5. Simvastatin 40 mg a day.  6. Enalapril 20 mg a day.  7. Atenolol 50 mg a day.  8. Advair daily.  9. Protonix 40 mg a day.  10.Allegra 180 mg a day.  11.Ultram 50 mg every 6 hours as needed.  12.Aspirin 325 mg a day.   NO KNOWN DRUG ALLERGIES.   PAST SURGICAL HISTORY:  1. Cholecystectomy.  2. Rotator cuff repair.  3. Repair of a broken ankle.  4. Hysterectomy.   SOCIAL HISTORY:  Patient does not smoke, drink, or use drugs.  She  currently lives alone.  Her daughter is named Sallyanne Havers; she is here  and provides some of the history.  Her phone number is [phone].  Her  alternate number is [phone].  Her daughter works at [employer] at night  on the weekdays.  She has another  daughter named [employer].  Her  telephone number is [phone].  She works at [employer] during the  weekdays, day shift.   REVIEW OF SYSTEMS:  As above, otherwise negative.   PHYSICAL EXAMINATION:  Temperature is 98.2.  Blood pressure 167/92.  Pulse 99.  Respiratory rate 14.  Oxygen saturation 96% on room air.  GENERAL:  Patient is an overweight black female in no acute distress.  HEENT:  Normocephalic, atraumatic.  Pupils equal, round, reactive to  light.  Sclerae nonicteric.  Moist mucous membranes.  Oropharynx is  without erythema or exudate.  NECK:  Supple.  No lymphadenopathy.  No thyromegaly.  LUNGS:  Clear to auscultation bilaterally without wheezes, rhonchi, or  rales.  CARDIOVASCULAR:  Regular rate and rhythm without murmurs, gallops, or  rubs.  ABDOMEN:  Obese, soft, nontender,  nondistended.  GU:  Deferred.  RECTAL:  Deferred.  EXTREMITIES:  No clubbing, cyanosis, or edema.  Pulses are intact.  No  focal ulcers.  NEUROLOGIC:  Patient is alert and appears oriented.  Cranial nerves  other than deafness are intact.  Sensory and motor exam are intact.  PSYCHIATRIC:  Patient is calm and cooperative.  SKIN:  No rash.   LABS:  White blood cell count is 14,000 with 95% neutrophils, hemoglobin  is 14, hematocrit 45, platelet count 335, initial glucose was 467,  currently 323 after 10 units of Humalog insulin.  Sodium is 138,  potassium 3.8, chloride 100, bicarbonate 20, glucose 466, BUN 15,  creatinine 0.99, anion gap is 18, total bilirubin 1.3, otherwise normal  liver function tests.  Urinalysis shows a specific gravity of 1.022,  urine glucose greater than 1000, urine bilirubin negative, greater than  80 ketones, negative blood, 100 protein, negative nitrite, negative  leukocyte esterase.   ASSESSMENT AND PLAN:  1. Vomiting:  I will check an amylase, lipase.  She has no tenderness,      however.  I suspect that she may be in diabetic ketoacidosis given      her elevated  anion gap.  I will check serum acetone and an ABG.      She will get intravenous fluids, antibiotics, and supportive care.      If her vomiting continues, consider CAT scan of the abdomen and      pelvis.  Also, with her history of cough, I will check a PA and      lateral chest x-ray to rule out pneumonia.  For now, she will get      clear liquids and advance to diabetic diet as tolerated.  2. Uncontrolled diabetes with increased anion gap, see above.  I will      start her on an insulin drip and hold her Avandamet for now as she      is vomiting.  I will also check a hemoglobin A1c.  She reports that      she thinks she is supposed to be on insulin but she does not know      how to administer it.  3. Cough, see above.  4. Hypertension.  I will continue atenolol and enalapril and if she is      unable to tolerate this she will get intravenous medications.  5. History of transient ischemic attack.  Start aspirin when she is      able to tolerate a diet.  6. History of cholecystectomy.  7. Hyperlipidemia.  Hold simvastatin for now.  8. Asthma.  She will get Advair and I will also continue her      Singulair, Claritin, and give scheduled albuterol as it may be      difficult for her to communicate whether she needs an inhaler.  She      will also be on droplet precautions      for now.  9. History of deafness.  10.Deep venous thrombosis prophylaxis.      Corinna L. Lendell Caprice, MD  Electronically Signed     CLS/MEDQ  D:  07/31/2008  T:  08/01/2008  Job:  [id number]   cc:   Dr. Clent Ridges

## 2011-02-28 ENCOUNTER — Encounter: Payer: MEDICARE | Attending: Endocrinology | Admitting: Dietician

## 2011-02-28 DIAGNOSIS — Z713 Dietary counseling and surveillance: Secondary | ICD-10-CM | POA: Insufficient documentation

## 2011-02-28 DIAGNOSIS — E119 Type 2 diabetes mellitus without complications: Secondary | ICD-10-CM | POA: Insufficient documentation

## 2011-03-02 NOTE — Op Note (Signed)
Valerie Rodriguez, Valerie Rodriguez                        ACCOUNT NO.:  [id number]   MEDICAL RECORD NO.:  [id number]                   PATIENT TYPE:  AMB   LOCATION:  DSC                                  FACILITY:  [facility]   PHYSICIAN:  Robert A. Thurston Hole, M.D.              DATE OF BIRTH:  01-16-1951   DATE OF PROCEDURE:  08/03/2003  DATE OF DISCHARGE:                                 OPERATIVE REPORT   PREOPERATIVE DIAGNOSIS:  Right shoulder partial rotator cuff tear with  partial glenoid labrum tear, partial biceps tendon tear with impingement,  and AC joint spurring and degenerative joint disease.   POSTOPERATIVE DIAGNOSIS:  Right shoulder partial rotator cuff tear with  partial glenoid labrum tear, partial biceps tendon tear with impingement,  and AC joint spurring and degenerative joint disease.   PROCEDURE:  Right shoulder examination under anesthesia followed by  arthroscopic partial labrum tear debridement, partial biceps tendon tear  debridement, partial rotator cuff tear debridement with subacromial  decompression and distal clavicle excision.   SURGEON:  Elana Alm. Thurston Hole, M.D.   ASSISTANT:  Heidi Dach, P.A.   ANESTHESIA:  General anesthesia.   OPERATIVE TIME:  40 minutes.   COMPLICATIONS:  None.   INDICATIONS FOR PROCEDURE:  Valerie Rodriguez is a 60 year old woman who has had  significant right shoulder pain for the past six to eight months increasing  in nature with signs and symptoms consistent with a partial rotator cuff  tear and labrum and biceps tendon partial tears with impingement and AC  joint spurring.  This had been confirmed by MRI and she has failed  conservative care and is now to undergo arthroscopy.   DESCRIPTION OF PROCEDURE:  Valerie Rodriguez was brought to the operating room on  August 03, 2003, placed on the operating table in the supine position.  After an adequate level of general anesthesia was obtained, her right  shoulder was examined under anesthesia.   She had full range of motion in her  shoulder with a stable ligamentous examination.  She was then placed in the  beach chair position and her shoulder and arm were prepped using sterile  DuraPrep and draped using sterile technique.  Originally, through a  posterior arthroscopic portal, the arthroscope with a pump attachment was  placed and through an anterior portal, an arthroscopic probe was placed.  On  initial inspection, the articular cartilage in the glenohumeral joint was  intact.  Anterior labrum partial tearing 25 to 30% as well as the superior  labrum and biceps tendon anchor 25 to 30% which was debrided.  Partial tear  of the biceps tendon 30 to 40% in the intra-articular portion and this was  debrided.  The rest of the biceps tendon anchor was intact as well as the  biceps tendon.  The anterior inferior labrum and anterior inferior  glenohumeral ligament complex was intact.  Posterior labrum partial tearing  25% which  was debrided.  The inferior capsular recess was free of pathology.  The rotator cuff was inspected from the articular surface and there was  found to be a partial tear 25% of the supraspinatus.  This was debrided and  the rest of the rotator cuff was found to be intact.  Subacromial space was  entered and a lateral arthroscopic portal was made.  A large amount of  bursitis was resected.  The rotator cuff was inflamed and thickened  underneath this but no evidence of a tear.  Impingement was noted and the  subacromial decompression was carried out,  removing 6 to 8 mm of the  undersurface of the anterior, anterolateral and anterior medial acromion and  CA ligament release carried out as well.  The [facility] joint showed significant  spurring and degenerative changes and the distal 5 to 6 mm of the clavicle  was resected with a 6 mm bur.  After this was done, the shoulder could be  brought through a full range of motion with no impingement on the rotator  cuff.  At this  point, it was felt that all pathology had been satisfactorily  addressed.  The instruments were removed.  Portals were closed with 3-0  nylon suture.  Sterile dressings and a sling applied and the patient  awakened and taken to the recovery room in stable condition.   FOLLOW UP CARE:  Valerie Rodriguez will be followed as an outpatient on Vicodin and  Naprosyn with early physical therapy.  See her back in the office in a week  for sutures out and follow-up.                                               Robert A. Thurston Hole, M.D.    RAW/MEDQ  D:  08/03/2003  T:  08/03/2003  Job:  [id number]

## 2011-03-02 NOTE — Op Note (Signed)
Valerie Rodriguez, Valerie Rodriguez                        ACCOUNT NO.:  [id number]   MEDICAL RECORD NO.:  [id number]                   PATIENT TYPE:  INP   LOCATION:  5034                                 FACILITY:  [facility]   PHYSICIAN:  Elana Alm. Thurston Hole, M.D.              DATE OF BIRTH:  18-Jun-1951   DATE OF PROCEDURE:  02/28/2003  DATE OF DISCHARGE:                                 OPERATIVE REPORT   PREOPERATIVE DIAGNOSIS:  Right ankle bimalleolar fracture.   POSTOPERATIVE DIAGNOSIS:  Right ankle bimalleolar fracture.   PROCEDURE:  Open reduction and internal fixation of right ankle bimalleolar  fracture.   SURGEON:  Elana Alm. Thurston Hole, M.D.   ANESTHESIA:  General.   OPERATIVE TIME:  45 minutes.   COMPLICATIONS:  None.   DESCRIPTION OF PROCEDURE:  The patient was brought to the operating room on  02/28/03, placed on the operating table in the supine position.  After an  adequate level of general anesthesia was obtained, her right leg was prepped  using Betadine and then draped using sterile technique.  She received Ancef  1 g IV preoperatively for prophylaxis.  The foot and leg was exsanguinated,  and a calf tourniquet elevated to 300 mm.  Initially, the fibula was exposed  through a 5-cm longitudinal incision.  The underlying subcutaneous tissues  were incised in line with the skin incision.  The peroneal tendons and sural  nerve were carefully retracted posteriorly while the fracture was exposed.  Hematoma was removed from around the fracture site, and then the fracture  was reduced and held in place with a clamp while a 4.0-mm anterior-to-  posterior lag screw was placed using standard AO technique.  After this was  done, a five-hole one third tubular plate was placed on the lateral surface  and the two most-distal screw holes measured, tapped, and the appropriate  length, 4.0-mm screws placed and the two most-proximal screw holes drilled,  measured, tapped, and the appropriate length  3.5-mm cortical screws placed.  After this was done, AP and lateral fluoroscopic x-rays confirmed anatomical  reduction the fracture as well as anatomic reduction of the mortise.  The  posterior malleolar fragments were small and did not need to be separately  internally fixated.  At this point then, the wound was irrigated and closed  using 2-0 Vicryl and skin staples.  Sterile dressings and a short leg splint  were applied.  Tourniquet was released.  The patient was then awakened and  taken to the recovery room in stable condition.  Needle and sponge counts  were correct x2 at the end of the case.                                               Robert A. Thurston Hole, M.D.  RAW/MEDQ  D:  02/28/2003  T:  03/01/2003  Job:  [id number]

## 2011-03-02 NOTE — Discharge Summary (Signed)
   NAMEDYANA, MAGNER                        ACCOUNT NO.:  [id number]   MEDICAL RECORD NO.:  [id number]                   PATIENT TYPE:  INP   LOCATION:  5034                                 FACILITY:  [facility]   PHYSICIAN:  Elana Alm. Thurston Hole, M.D.              DATE OF BIRTH:  1951/05/18   DATE OF ADMISSION:  02/27/2003  DATE OF DISCHARGE:  03/02/2003                                 DISCHARGE SUMMARY   ADMISSION DIAGNOSES:  1. Bimalleolar ankle fracture dislocation.  2. Non-insulin dependent diabetes.  3. Hypertension.  4. History of a stroke.  5. Hearing loss.  6. Morbid obesity.   DISCHARGE DIAGNOSES:  1. Right ankle fracture dislocation.  2. Non-insulin dependent diabetes.  3. Hypertension.  4. History of a stroke.  5. Hearing loss.  6. Morbid obesity.   HISTORY OF PRESENT ILLNESS:  The patient is a 60 year old female who fell  down a grass knoll today. She was brought to [facility] ER and noted to  have a fracture dislocation.   HOSPITAL COURSE:  She was transferred to [facility] due to OR  availability and medical clearance as her regular medical doctors are the  internal medicine teaching service. She received medical clearance by  internal medicine on the 15th. On the 16th she underwent open reduction  internal fixation right ankle fracture by Dr. Thurston Hole. She tolerated the  procedure well.   By postoperative day one the patient was doing well. Her pain was under  control with a PCA. T-max was 99.5. Postoperative day two the patient  continued to do well. Surgical wound was well approximated. She was placed  on an aspirin a day for DVT prophylaxis. She was discharged to home in  stable condition with [facility] and the care of her daughter. She is  non-weightbearing on her right leg. On a regular diet. Been instructed to  keep her cast clean and dry. Instructed to elevate. She will follow up with  Dr. Thurston Hole in one week or sooner if she has  problems.     Kirstin Shepperson, P.A.                  Robert A. Thurston Hole, M.D.    KS/MEDQ  D:  04/14/2003  T:  04/14/2003  Job:  [id number]

## 2011-04-13 ENCOUNTER — Other Ambulatory Visit: Payer: Self-pay | Admitting: *Deleted

## 2011-04-13 MED ORDER — ROSUVASTATIN CALCIUM 40 MG PO TABS: 40.0000 mg | ORAL_TABLET | Freq: Every day | ORAL | Status: DC

## 2011-04-13 MED ORDER — HYDRALAZINE HCL 50 MG PO TABS: 50.0000 mg | ORAL_TABLET | Freq: Two times a day (BID) | ORAL | Status: DC

## 2011-06-26 ENCOUNTER — Ambulatory Visit: Payer: MEDICARE | Admitting: Dietician

## 2011-06-28 ENCOUNTER — Telehealth: Payer: Self-pay | Admitting: *Deleted

## 2011-06-28 NOTE — Telephone Encounter (Signed)
Called patient to schedule Colonoscopy.  She is hearing impaired and I asked her if she had her last Colon at [facility] or [facility]  She stated it was at the hospital.  I advised her due to the circumstance, I would ask Dr. Marina Goodell first to see what she needs to do.  I will call her back once I find out.

## 2011-07-12 LAB — DIFFERENTIAL
Basophils Absolute: 0.1
Basophils Relative: 1
Eosinophils Absolute: 0.1
Eosinophils Relative: 1
Lymphs Abs: 2.4
Neutrophils Relative %: 70

## 2011-07-12 LAB — COMPREHENSIVE METABOLIC PANEL
ALT: 21
AST: 21
Alkaline Phosphatase: 72
CO2: 22
Calcium: 9.1
Chloride: 103
GFR calc Af Amer: 56 — ABNORMAL LOW
GFR calc non Af Amer: 46 — ABNORMAL LOW
Glucose, Bld: 257 — ABNORMAL HIGH
Potassium: 3.8
Sodium: 136
Total Bilirubin: 0.7

## 2011-07-12 LAB — URINALYSIS, ROUTINE W REFLEX MICROSCOPIC
Glucose, UA: NEGATIVE
Ketones, ur: 15 — AB
Protein, ur: NEGATIVE

## 2011-07-12 LAB — CBC
Hemoglobin: 13.6
MCHC: 32.5
RBC: 5.35 — ABNORMAL HIGH
WBC: 10.5

## 2011-07-12 LAB — URINE CULTURE: Colony Count: >=100000

## 2011-07-12 LAB — LIPASE, BLOOD: Lipase: 29

## 2011-07-12 LAB — URINE MICROSCOPIC-ADD ON

## 2011-07-12 LAB — PREGNANCY, URINE: Preg Test, Ur: NEGATIVE

## 2011-07-16 LAB — BASIC METABOLIC PANEL
BUN: 10
BUN: 12
BUN: 14
BUN: 6
BUN: 8
BUN: 8
CO2: 23
Calcium: 8.1 — ABNORMAL LOW
Calcium: 8.4
Calcium: 8.7
Calcium: 9.2
Chloride: 105
Chloride: 107
Chloride: 110
Creatinine, Ser: 0.7
Creatinine, Ser: 0.77
GFR calc Af Amer: >60
GFR calc Af Amer: >60
GFR calc non Af Amer: >60
GFR calc non Af Amer: >60
GFR calc non Af Amer: >60
GFR calc non Af Amer: >60
GFR calc non Af Amer: >60
Glucose, Bld: 100 — ABNORMAL HIGH
Glucose, Bld: 182 — ABNORMAL HIGH
Glucose, Bld: 339 — ABNORMAL HIGH
Potassium: 3.1 — ABNORMAL LOW
Potassium: 3.8
Potassium: 3.9
Potassium: 4.1
Sodium: 143

## 2011-07-16 LAB — GLUCOSE, RANDOM: Glucose, Bld: 284 — ABNORMAL HIGH

## 2011-07-16 LAB — COMPREHENSIVE METABOLIC PANEL
ALT: 20
AST: 25
Alkaline Phosphatase: 85
Calcium: 9.2
GFR calc Af Amer: >60
Potassium: 3.8
Sodium: 138
Total Protein: 8

## 2011-07-16 LAB — BLOOD GAS, ARTERIAL
Acid-base deficit: 6.7 — ABNORMAL HIGH
Bicarbonate: 18.2 — ABNORMAL LOW
Drawn by: 23604
FIO2: 0.21
O2 Saturation: 92.9
Patient temperature: 98.6
TCO2: 19.2
pCO2 arterial: 35.4
pH, Arterial: 7.331 — ABNORMAL LOW
pO2, Arterial: 69.8 — ABNORMAL LOW

## 2011-07-16 LAB — GLUCOSE, CAPILLARY
Glucose-Capillary: 116 — ABNORMAL HIGH
Glucose-Capillary: 180 — ABNORMAL HIGH
Glucose-Capillary: 203 — ABNORMAL HIGH
Glucose-Capillary: 212 — ABNORMAL HIGH
Glucose-Capillary: 216 — ABNORMAL HIGH
Glucose-Capillary: 237 — ABNORMAL HIGH
Glucose-Capillary: 239 — ABNORMAL HIGH
Glucose-Capillary: 253 — ABNORMAL HIGH
Glucose-Capillary: 303 — ABNORMAL HIGH
Glucose-Capillary: 323 — ABNORMAL HIGH
Glucose-Capillary: 335 — ABNORMAL HIGH
Glucose-Capillary: 42 — ABNORMAL LOW
Glucose-Capillary: 65 — ABNORMAL LOW

## 2011-07-16 LAB — DIFFERENTIAL
Basophils Relative: 0
Eosinophils Absolute: 0
Eosinophils Absolute: 0
Eosinophils Relative: 0
Lymphs Abs: 0.7
Lymphs Abs: 2.3
Monocytes Absolute: 0 — ABNORMAL LOW
Monocytes Absolute: 0.8
Monocytes Relative: 0 — ABNORMAL LOW
Neutrophils Relative %: 77

## 2011-07-16 LAB — CBC
Hemoglobin: 14.5
MCHC: 31.9
MCHC: 32.3
MCV: 77.1 — ABNORMAL LOW
Platelets: 260
RBC: 5.87 — ABNORMAL HIGH
RDW: 14.6
RDW: 15.4
WBC: 13.8 — ABNORMAL HIGH

## 2011-07-16 LAB — HEMOGLOBIN A1C: Hgb A1c MFr Bld: 12.1 — ABNORMAL HIGH

## 2011-07-16 LAB — URINALYSIS, ROUTINE W REFLEX MICROSCOPIC
Glucose, UA: >1000 — AB
Ketones, ur: >80 — AB
Leukocytes, UA: NEGATIVE
Nitrite: NEGATIVE
pH: 5

## 2011-07-16 LAB — KETONES, QUALITATIVE

## 2011-07-16 LAB — URINE MICROSCOPIC-ADD ON

## 2011-07-20 ENCOUNTER — Other Ambulatory Visit: Payer: Self-pay | Admitting: Family Medicine

## 2011-07-30 ENCOUNTER — Telehealth: Payer: Self-pay | Admitting: Family Medicine

## 2011-07-30 NOTE — Telephone Encounter (Signed)
Wants her mom put in a nursing home. Please advise of recommendation. Thanks.

## 2011-07-30 NOTE — Telephone Encounter (Signed)
They need to make an OV to discuss this

## 2011-07-31 NOTE — Telephone Encounter (Signed)
Spoke with pt

## 2011-08-01 LAB — COMPREHENSIVE METABOLIC PANEL
ALT: 53 — ABNORMAL HIGH
Alkaline Phosphatase: 85
BUN: 14
CO2: 24
GFR calc non Af Amer: >60
Glucose, Bld: 192 — ABNORMAL HIGH
Potassium: 4.2
Sodium: 137

## 2011-08-01 LAB — CBC
HCT: 41.2
Hemoglobin: 13.4
MCHC: 32.4
RBC: 5.37 — ABNORMAL HIGH

## 2011-08-01 LAB — I-STAT 8, (EC8 V) (CONVERTED LAB)
Acid-Base Excess: 3 — ABNORMAL HIGH
Bicarbonate: 28.9 — ABNORMAL HIGH
HCT: 48 — ABNORMAL HIGH
Operator id: 247071
TCO2: 30
pCO2, Ven: 48.1

## 2011-08-01 LAB — POCT URINALYSIS DIP (DEVICE)
Ketones, ur: NEGATIVE
Specific Gravity, Urine: 1.025
pH: 5.5

## 2011-08-01 LAB — LIPASE, BLOOD: Lipase: 25

## 2011-08-01 LAB — DIFFERENTIAL
Basophils Absolute: 0.1
Basophils Relative: 1
Eosinophils Absolute: 0
Neutro Abs: 7.5
Neutrophils Relative %: 74

## 2011-08-01 LAB — POCT I-STAT CREATININE: Creatinine, Ser: 0.8

## 2011-09-03 ENCOUNTER — Other Ambulatory Visit: Payer: Self-pay | Admitting: Family Medicine

## 2011-09-03 NOTE — Telephone Encounter (Signed)
Pt requesting refill on    lisinopril (PRINIVIL,ZESTRIL) 40 MG tablet    [facility] [address]

## 2011-09-03 NOTE — Telephone Encounter (Signed)
Refill request for Novolog mix flexpen.

## 2011-09-04 ENCOUNTER — Other Ambulatory Visit: Payer: Self-pay | Admitting: Family Medicine

## 2011-09-04 DIAGNOSIS — Z1231 Encounter for screening mammogram for malignant neoplasm of breast: Secondary | ICD-10-CM

## 2011-10-12 ENCOUNTER — Telehealth: Payer: Self-pay | Admitting: Family Medicine

## 2011-10-12 MED ORDER — ATENOLOL 50 MG PO TABS: 50.0000 mg | ORAL_TABLET | Freq: Every day | ORAL | Status: DC

## 2011-10-12 NOTE — Telephone Encounter (Signed)
Script sent e-scribe 

## 2011-10-17 ENCOUNTER — Ambulatory Visit (HOSPITAL_COMMUNITY): Payer: Medicare Other

## 2011-10-25 ENCOUNTER — Ambulatory Visit (HOSPITAL_COMMUNITY): Payer: Medicare Other

## 2011-10-29 ENCOUNTER — Ambulatory Visit (HOSPITAL_COMMUNITY)
Admission: RE | Admit: 2011-10-29 | Discharge: 2011-10-29 | Disposition: A | Discharge status: Home / Self Care | Payer: Medicare Other | Source: Ambulatory Visit | Attending: Family Medicine | Admitting: Family Medicine

## 2011-10-29 DIAGNOSIS — Z1231 Encounter for screening mammogram for malignant neoplasm of breast: Secondary | ICD-10-CM | POA: Insufficient documentation

## 2011-11-12 ENCOUNTER — Other Ambulatory Visit: Payer: Self-pay

## 2011-11-12 MED ORDER — LISINOPRIL 40 MG PO TABS: 40.0000 mg | ORAL_TABLET | Freq: Every day | ORAL | Status: DC

## 2011-11-19 NOTE — Telephone Encounter (Signed)
Dr Marina Goodell I ran across this note today when I was trying to work on some of the recall that were left from [place]. Please advise, I am looking for the chart.

## 2011-11-19 NOTE — Telephone Encounter (Signed)
I found the chart and Dr Marina Goodell had answered Britta Mccreedy on paper and we will have Verlon Au address the chart soon.

## 2011-11-22 ENCOUNTER — Telehealth: Payer: Self-pay | Admitting: *Deleted

## 2011-11-22 NOTE — Telephone Encounter (Signed)
Per Dr. Marina Goodell, the patient was done in the [facility] in 2010. He wants the patient to have another colonosopy  (needs a more vigorous prep) with a 2 day prep, 1st day 2 days of clear liquids, one bottle of Magnesium citrate morning before the procedure and standard Movipep evening prior to colonoscopy. Called patient today 11-22-2011 and left a message with the hearing impared  Answering service for the patient to call me.

## 2011-11-23 ENCOUNTER — Telehealth: Payer: Self-pay | Admitting: *Deleted

## 2011-11-23 NOTE — Telephone Encounter (Signed)
See phone call message from 11-22-2011.

## 2011-11-23 NOTE — Telephone Encounter (Signed)
See telephone call

## 2011-11-30 NOTE — Telephone Encounter (Signed)
Called patient again on Friday 11-30-2011 and left a message for her to call our office at [phone] to schedule you next recall Colonoscopy.  Her last one was 03-30-2009 and it was a poor prep.  Dr Marina Goodell is suggesting she repeat the colonoscopy.

## 2011-12-14 ENCOUNTER — Ambulatory Visit (INDEPENDENT_AMBULATORY_CARE_PROVIDER_SITE_OTHER): Payer: Medicare Other | Admitting: Family Medicine

## 2011-12-14 ENCOUNTER — Encounter: Payer: Self-pay | Admitting: Family Medicine

## 2011-12-14 ENCOUNTER — Other Ambulatory Visit: Payer: Self-pay | Admitting: Family Medicine

## 2011-12-14 VITALS — BP 108/66 | HR 75 | Temp 97.7°F | Ht 59.5 in | Wt 128.0 lb

## 2011-12-14 DIAGNOSIS — Z0000 Encounter for general adult medical examination without abnormal findings: Secondary | ICD-10-CM

## 2011-12-14 DIAGNOSIS — I6350 Cerebral infarction due to unspecified occlusion or stenosis of unspecified cerebral artery: Secondary | ICD-10-CM

## 2011-12-14 DIAGNOSIS — E785 Hyperlipidemia, unspecified: Secondary | ICD-10-CM

## 2011-12-14 DIAGNOSIS — I639 Cerebral infarction, unspecified: Secondary | ICD-10-CM

## 2011-12-14 DIAGNOSIS — R269 Unspecified abnormalities of gait and mobility: Secondary | ICD-10-CM

## 2011-12-14 DIAGNOSIS — N289 Disorder of kidney and ureter, unspecified: Secondary | ICD-10-CM

## 2011-12-14 LAB — POCT URINALYSIS DIPSTICK
Glucose, UA: NEGATIVE
Spec Grav, UA: >=1.03
pH, UA: 5.5

## 2011-12-14 LAB — CBC WITH DIFFERENTIAL/PLATELET
Basophils Relative: 0.2 % (ref 0.0–3.0)
Eosinophils Relative: 0.5 % (ref 0.0–5.0)
HCT: 40.6 % (ref 36.0–46.0)
Lymphs Abs: 1.2 10*3/uL (ref 0.7–4.0)
MCHC: 32.6 g/dL (ref 30.0–36.0)
MCV: 76.2 fl — ABNORMAL LOW (ref 78.0–100.0)
Monocytes Absolute: 0.4 10*3/uL (ref 0.1–1.0)
Neutro Abs: 9.4 10*3/uL — ABNORMAL HIGH (ref 1.4–7.7)
RBC: 5.33 Mil/uL — ABNORMAL HIGH (ref 3.87–5.11)
WBC: 11 10*3/uL — ABNORMAL HIGH (ref 4.5–10.5)

## 2011-12-14 LAB — HEPATIC FUNCTION PANEL
ALT: 9 U/L (ref 0–35)
AST: 19 U/L (ref 0–37)
Albumin: 3.9 g/dL (ref 3.5–5.2)
Total Protein: 7.7 g/dL (ref 6.0–8.3)

## 2011-12-14 LAB — LIPID PANEL: HDL: 42.2 mg/dL (ref >39.00)

## 2011-12-14 LAB — BASIC METABOLIC PANEL
BUN: 28 mg/dL — ABNORMAL HIGH (ref 6–23)
CO2: 16 mEq/L — ABNORMAL LOW (ref 19–32)
Chloride: 107 mEq/L (ref 96–112)
Glucose, Bld: 164 mg/dL — ABNORMAL HIGH (ref 70–99)
Potassium: 4.4 mEq/L (ref 3.5–5.1)

## 2011-12-14 MED ORDER — PROMETHAZINE HCL 25 MG PO TABS: 25.0000 mg | ORAL_TABLET | ORAL | Status: DC | PRN

## 2011-12-14 MED ORDER — ROSUVASTATIN CALCIUM 40 MG PO TABS: 40.0000 mg | ORAL_TABLET | Freq: Every day | ORAL | Status: DC

## 2011-12-14 MED ORDER — LISINOPRIL 40 MG PO TABS: 40.0000 mg | ORAL_TABLET | Freq: Every day | ORAL | Status: DC

## 2011-12-14 MED ORDER — CITALOPRAM HYDROBROMIDE 20 MG PO TABS: 20.0000 mg | ORAL_TABLET | Freq: Every day | ORAL | Status: DC

## 2011-12-14 MED ORDER — PANTOPRAZOLE SODIUM 40 MG PO TBEC: 40.0000 mg | DELAYED_RELEASE_TABLET | Freq: Every day | ORAL | Status: DC

## 2011-12-14 MED ORDER — ATENOLOL 50 MG PO TABS: 50.0000 mg | ORAL_TABLET | Freq: Every day | ORAL | Status: DC

## 2011-12-14 MED ORDER — HYDRALAZINE HCL 50 MG PO TABS: 50.0000 mg | ORAL_TABLET | Freq: Two times a day (BID) | ORAL | Status: DC

## 2011-12-14 MED ORDER — CLOPIDOGREL BISULFATE 75 MG PO TABS: 75.0000 mg | ORAL_TABLET | Freq: Every day | ORAL | Status: AC

## 2011-12-14 NOTE — Progress Notes (Signed)
Subjective:    Patient ID: Valerie Rodriguez, female    DOB: 11-24-1950, 61 y.o.   MRN: [id number]  HPI 61 yr old female with her daughter and an interpreter for a cpx. Her daughter gives most of the hx and answers most of my questions. Ever since her stroke, Valerie Rodriguez has been slowly declining, and her daughter is quite concerned. She does not eat much at all, and her appetite is very poor. Her daughter thinks she is depressed, although Valerie Rodriguez disagrees. She seems very apathetic all the time, her hygiene is poor, and she is not interested in doing anything but sitting around all day. Her sleep is stable. She has seen Dr. Pearlean Brownie to follow up on her stroke, but the daughter asks if she could see another neurologist to do some cognitive testing on her. She is not sure if she is having some dementia or if she is simply depressed. Her diabetes is very poorly controlled, and when she saw Dr. Talmage Nap last month her A1c was over 14. She has become totally incontinent of urine, and she wears Depends daily.   Review of Systems  Constitutional: Positive for activity change, appetite change and unexpected weight change. Negative for fever, chills, diaphoresis and fatigue.  HENT: Negative.   Eyes: Negative.   Respiratory: Negative.   Cardiovascular: Negative.   Gastrointestinal: Negative.   Genitourinary: Negative for dysuria, urgency, frequency, hematuria, flank pain, decreased urine volume, enuresis, difficulty urinating, pelvic pain and dyspareunia.  Musculoskeletal: Negative.   Skin: Negative.   Neurological: Negative.   Hematological: Negative.   Psychiatric/Behavioral: Positive for confusion and dysphoric mood. Negative for suicidal ideas, hallucinations, behavioral problems, sleep disturbance, self-injury, decreased concentration and agitation. The patient is not nervous/anxious and is not hyperactive.        Objective:   Physical Exam  Constitutional: She is oriented to person, place, and time. She  appears well-developed and well-nourished. No distress.  HENT:  Head: Normocephalic and atraumatic.  Right Ear: External ear normal.  Left Ear: External ear normal.  Nose: Nose normal.  Mouth/Throat: Oropharynx is clear and moist. No oropharyngeal exudate.  Eyes: Conjunctivae and EOM are normal. Pupils are equal, round, and reactive to light. No scleral icterus.  Neck: Normal range of motion. Neck supple. No JVD present. No thyromegaly present.  Cardiovascular: Normal rate, regular rhythm, normal heart sounds and intact distal pulses.  Exam reveals no gallop and no friction rub.   No murmur heard.      EKG normal   Pulmonary/Chest: Effort normal and breath sounds normal. No respiratory distress. She has no wheezes. She has no rales. She exhibits no tenderness.  Abdominal: Soft. Bowel sounds are normal. She exhibits no distension and no mass. There is no tenderness. There is no rebound and no guarding.  Musculoskeletal: Normal range of motion. She exhibits no edema and no tenderness.  Lymphadenopathy:    She has no cervical adenopathy.  Neurological: She is alert and oriented to person, place, and time. She has normal reflexes. No cranial nerve deficit. She exhibits normal muscle tone. Coordination normal.  Skin: Skin is warm and dry. No rash noted. No erythema.  Psychiatric: Her behavior is normal.       Affect is very depressed, poor eye contact           Assessment & Plan:  Well exam. She is definitely depressed so we will start her on Celexa 20 mg a day. The daughter will call us in 2-3 weeks for an  update. Set up another colonoscopy. Refer to Dr. Modesto Charon for a Neurologic evaluation.

## 2011-12-17 ENCOUNTER — Telehealth: Payer: Self-pay | Admitting: Family Medicine

## 2011-12-17 NOTE — Telephone Encounter (Signed)
Returning your call. °

## 2011-12-17 NOTE — Telephone Encounter (Signed)
Spoke with Valerie Rodriguez.

## 2011-12-17 NOTE — Progress Notes (Signed)
Addended by: Gershon Crane A on: 12/17/2011 10:56 PM   Modules accepted: Orders

## 2011-12-19 ENCOUNTER — Encounter: Payer: Self-pay | Admitting: Family Medicine

## 2011-12-19 NOTE — Progress Notes (Signed)
Quick Note:  Spoke to pt's daughter and put a copy of results in mail. ______

## 2011-12-21 ENCOUNTER — Ambulatory Visit (INDEPENDENT_AMBULATORY_CARE_PROVIDER_SITE_OTHER): Payer: Medicare Other | Admitting: Neurology

## 2011-12-21 ENCOUNTER — Encounter: Payer: Self-pay | Admitting: Neurology

## 2011-12-21 VITALS — BP 132/70 | HR 76 | Wt 133.0 lb

## 2011-12-21 DIAGNOSIS — I639 Cerebral infarction, unspecified: Secondary | ICD-10-CM

## 2011-12-21 DIAGNOSIS — I6350 Cerebral infarction due to unspecified occlusion or stenosis of unspecified cerebral artery: Secondary | ICD-10-CM

## 2011-12-21 NOTE — Progress Notes (Signed)
Dear Dr. Clent Ridges,  Thank you for having me see Valerie Rodriguez in consultation today at [facility] for her problem with depression and abulia.  As you may recall, she is a 61 y.o. 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 The ischemic stroke left her with a mild left hemiparesis. She moved in with her daughter after the stroke and her daughter says that she has had a "decreased drive to do". She is not interested in eating or taking care of her self. She sits and watches television throughout the day. She does not want to leave the house. When asked about memory problems both patient and daughter deny any obvious abnormalities. You felt that she was depressed and recently put her on citalopram 20 mg. She's only been on this 5 days so it is difficult to tell if she is improved.  When asking about her previous level of function it doesn't sound like this changed much from before the stroke. However the differences she was living alone. When the complaints the daughter has about her eating. She does not seem to be interested in eating. However when she was living alone before the stroke the typical meal for her would be a can of tomatoes. Her daily activities didn't sound much different before the stroke either. She did have to stop driving because she was having periods of hypoglycemia. Her daughter forced her to stop driving several months before her stroke.  She denies hallucinations. She's been incontinent since the stroke.  Past Medical History  Diagnosis Date  . Hypertension   . Hyperlipemia   . Deaf-mutism   . Glaucoma     Dr Eulah Pont  . Diabetes mellitus     Type II, sees Dr. Candie Chroman  . Allergy   . Asthma    sees Dr [place] Callas  . Stroke 11-05-10    sees Dr. Pearlean Brownie   no history of seizures.  Past Surgical History  Procedure Date  . Cholecystectomy   . Abdominal hysterectomy     oophorectomy 1976  . Rotator cuff repair 2004  . Colonoscopy 03-30-09    per Dr. Marina Goodell, poor prep, repeat one yr     History   Social History  . Marital Status: Widowed    Spouse Name: N/A    Number of Children: N/A  . Years of Education: N/A   Social History Main Topics  . Smoking status: Former Smoker    Quit date: 12/21/1979  . Smokeless tobacco: Never Used  . Alcohol Use: Yes - daily until 1997  . Drug Use: None  . Sexually Active: None   Other Topics Concern  . None   Social History Narrative  . None  - worked as a Programmer, applications until 5 years ago.  Family History  Problem Relation Age of Onset  . Hypertension      family hx  . Coronary artery disease      female 1st degree relative  . Hyperlipidemia      family hx  . Diabetes      1st degree relative   no significant psychiatric disease in first-degree relatives. No history of dementia in first-degree relatives.  Current Outpatient Prescriptions on File Prior to Visit  Medication Sig Dispense Refill  . albuterol (  PROAIR HFA) 108 (90 BASE) MCG/ACT inhaler Inhale into the lungs every 4 (four) hours as needed.        [place] Kitchen atenolol (TENORMIN) 50 MG tablet Take 1 tablet (50 mg total) by mouth daily.  30 tablet  11  . azelastine (ASTELIN) 137 MCG/SPRAY nasal spray 1 spray by Nasal route 2 (two) times daily. Use in each nostril as directed       . budesonide-formoterol (SYMBICORT) 160-4.5 MCG/ACT inhaler Inhale 2 puffs into the lungs 2 (two) times daily.        . citalopram (CELEXA) 20 MG tablet Take 1 tablet (20 mg total) by mouth daily.  30 tablet  11  . clopidogrel (PLAVIX) 75 MG tablet Take 1 tablet (75 mg total) by mouth daily.  30 tablet  11  . fexofenadine (ALLEGRA) 180 MG tablet Take 180 mg by mouth daily.        . fluticasone (FLONASE) 50  MCG/ACT nasal spray 2 sprays by Nasal route daily.  16 g  3  . Glucose Blood (BAYER BREEZE 2 TEST) DISK 2 (two) times daily.        Mar[place] Kitchen glucose blood test strip 1 each by Other route 4 (four) times daily. Use as instructed      . hydrALAZINE (APRESOLINE) 50 MG tablet Take 1 tablet (50 mg total) by mouth 2 (two) times daily.  60 tablet  11  . ibuprofen (ADVIL,MOTRIN) 800 MG tablet Take 800 mg by mouth 3 (three) times daily as needed.        . Insulin Aspart Prot & Aspart (NOVOLOG MIX 70/30 FLEXPEN [place]) Inject 15 Units into the skin 3 (three) times daily.       . insulin glargine (LANTUS) 100 UNIT/ML injection Inject 8 Units into the skin at bedtime.       Marland Kitchen latanoprost (XALATAN) 0.005 % ophthalmic solution Place 1 drop into both eyes at bedtime.        Mar[place] Kitchen lisinopril (PRINIVIL,ZESTRIL) 40 MG tablet Take 1 tablet (40 mg total) by mouth daily.  30 tablet  11  . metFORMIN (GLUCOPHAGE) 1000 MG tablet Take 1,000 mg by mouth 2 (two) times daily with meals.        . montelukast (SINGULAIR) 10 MG tablet Take 10 mg by mouth at bedtime.        . pantoprazole (PROTONIX) 40 MG tablet Take 1 tablet (40 mg total) by mouth daily.  30 tablet  11  . promethazine (PHENERGAN) 25 MG tablet Take 1 tablet (25 mg total) by mouth every 4 (four) hours as needed for nausea.  60 tablet  11  . rosuvastatin (CRESTOR) 40 MG tablet Take 1 tablet (40 mg total) by mouth daily.  30 tablet  11  . albuterol-ipratropium (COMBIVENT) 18-103 MCG/ACT inhaler Inhale 2 puffs into the lungs 4 (four) times daily.          No Known Allergies    ROS:  13 systems were reviewed and are notable for weight loss due to anorexia.  She has also had vomiting recently.  She also has a history of falls. She gets leg pain when walking.  She has been incontinent of urine since Stroke.  She gets headaches and has weakness on her left side..  All other review of systems are unremarkable.   Examination:  Filed Vitals:   12/21/11 1332  BP: 132/70  Pulse:  76  Weight: 133 lb (60.328 kg)     In general, disheveled appearing older woman who  is in a roller chair.  Cardiovascular: The patient has a regular rate and rhythm.  Fundoscopy:  Disks are flat.  Mental status:   Oriented to 5/5 place; 5/5 time; 0/3 word recall, but full registration; WORLD 5/5 - did not check other elements  Cranial Nerves: Pupils are equally round and reactive to light.  Extraocular movements are intact without nystagmus. Facial sensation and muscles of mastication are intact. Muscles of facial expression reveal left lower facial droop. Absent hearing bilaterally. Tongue protrusion, uvula, palate midline.  Shoulder shrug intact  Motor:  The patient has normal bulk but mildly increased tone on left, but left pronator drift.  There are no adventitious movements.  4+/5 SA weakness on left, also 4+/5 left HF and left FDF.  Otherwise 5/5  Reflexes:  Brisker on left side.  Toes bilaterally extensor.  Coordination:  Mild dysmetria on left.  Sensation is symmetric to light touch.  Can only walk with a walker.  Unstable.  MRI brain was reviewed. Revealed acute infarcts right IC, right thalamus.  Chronic infarcts right thalamus and left thalamus.  Chronic ischemia in the pons.  Mesial temporal structures look no more atrophied then rest of brain.  Impression/Recs:   1.  Abulia - I believe that Ms. Mckamey's main abnormality is her depression.  While she could have some cognitive dysfunction, I suspect it is from her depression and not a primary neurodegenerative disorder.  It will be interesting to see how she does on the citalopram.  I don't think her depression is new, but rather was noticed when she moved in with her daughter.  I don't think there is a role for further cognitive testing at this point in time particularly given the difficulty of doing it with her hearing impairment. 2.  Ischemic stroke - I did not focus on this today, but given her intracranial  atherosclerosis it make sense for her to stay on the Plavix.   Thank you for having [place] see Valerie Rodriguez in consultation.  Feel free to contact me with any questions.  Lupita Raider Modesto Charon, MD [facility], [place] [address] [place], [place] [address] Phone: [phone] Fax: [phone].

## 2011-12-26 ENCOUNTER — Other Ambulatory Visit: Payer: Self-pay | Admitting: Nephrology

## 2011-12-26 DIAGNOSIS — E119 Type 2 diabetes mellitus without complications: Secondary | ICD-10-CM

## 2011-12-26 DIAGNOSIS — I10 Essential (primary) hypertension: Secondary | ICD-10-CM

## 2011-12-26 DIAGNOSIS — R809 Proteinuria, unspecified: Secondary | ICD-10-CM

## 2011-12-31 ENCOUNTER — Ambulatory Visit
Admission: RE | Admit: 2011-12-31 | Discharge: 2011-12-31 | Disposition: A | Discharge status: Home / Self Care | Payer: Medicare Other | Source: Ambulatory Visit | Attending: Nephrology | Admitting: Nephrology

## 2011-12-31 DIAGNOSIS — E119 Type 2 diabetes mellitus without complications: Secondary | ICD-10-CM

## 2011-12-31 DIAGNOSIS — R809 Proteinuria, unspecified: Secondary | ICD-10-CM

## 2011-12-31 DIAGNOSIS — I10 Essential (primary) hypertension: Secondary | ICD-10-CM

## 2012-02-04 ENCOUNTER — Telehealth: Payer: Self-pay | Admitting: Family Medicine

## 2012-02-04 NOTE — Telephone Encounter (Signed)
Daughter has questions about mom...please call her.

## 2012-02-04 NOTE — Telephone Encounter (Signed)
Daughter has questions about mom...please

## 2012-02-04 NOTE — Telephone Encounter (Signed)
I tried to call pt and left a voice message for pt to return.

## 2012-02-05 NOTE — Telephone Encounter (Signed)
Spoke to daughter.

## 2012-02-18 ENCOUNTER — Encounter: Payer: Self-pay | Admitting: Gastroenterology

## 2012-05-09 ENCOUNTER — Encounter: Payer: Self-pay | Admitting: Internal Medicine

## 2012-05-09 ENCOUNTER — Encounter: Payer: Self-pay | Admitting: Gastroenterology

## 2012-06-13 DIAGNOSIS — Z0279 Encounter for issue of other medical certificate: Secondary | ICD-10-CM

## 2012-09-03 ENCOUNTER — Other Ambulatory Visit: Payer: Self-pay | Admitting: *Deleted

## 2012-09-03 DIAGNOSIS — R0989 Other specified symptoms and signs involving the circulatory and respiratory systems: Secondary | ICD-10-CM

## 2012-09-15 ENCOUNTER — Encounter: Payer: Self-pay | Admitting: Vascular Surgery

## 2012-09-16 ENCOUNTER — Ambulatory Visit (INDEPENDENT_AMBULATORY_CARE_PROVIDER_SITE_OTHER): Payer: Medicare Other | Admitting: Vascular Surgery

## 2012-09-16 ENCOUNTER — Encounter: Payer: Self-pay | Admitting: Vascular Surgery

## 2012-09-16 ENCOUNTER — Encounter (INDEPENDENT_AMBULATORY_CARE_PROVIDER_SITE_OTHER): Payer: Medicare Other | Admitting: *Deleted

## 2012-09-16 VITALS — BP 164/83 | HR 71 | Resp 16 | Ht 60.0 in | Wt 147.0 lb

## 2012-09-16 DIAGNOSIS — M79604 Pain in right leg: Secondary | ICD-10-CM | POA: Insufficient documentation

## 2012-09-16 DIAGNOSIS — R600 Localized edema: Secondary | ICD-10-CM

## 2012-09-16 DIAGNOSIS — M79609 Pain in unspecified limb: Secondary | ICD-10-CM

## 2012-09-16 DIAGNOSIS — I70219 Atherosclerosis of native arteries of extremities with intermittent claudication, unspecified extremity: Secondary | ICD-10-CM

## 2012-09-16 DIAGNOSIS — M79605 Pain in left leg: Secondary | ICD-10-CM | POA: Insufficient documentation

## 2012-09-16 DIAGNOSIS — R0989 Other specified symptoms and signs involving the circulatory and respiratory systems: Secondary | ICD-10-CM

## 2012-09-16 DIAGNOSIS — I739 Peripheral vascular disease, unspecified: Secondary | ICD-10-CM

## 2012-09-16 DIAGNOSIS — R609 Edema, unspecified: Secondary | ICD-10-CM

## 2012-09-16 NOTE — Progress Notes (Signed)
Subjective:     Patient ID: Valerie Rodriguez, female   DOB: 1951/04/05, 61 y.o.   MRN: [id number]  HPI this 61 year old female who has been deaf since birth was referred by Dr. Eliott Nine for evaluation of lower extremity circulation. Patient is accompanied by her daughter and an interpreter. Patient denies a history of nonhealing ulcers, gangrene, infection, or other healing problems. She does complain of discomfort in both legs. She does have chronic edema in both legs. She is not ambulate more than a few feet and that has to be with assistance. She has had 2 previous strokes in the past. She has chronic renal insufficiency and diabetes mellitus.  Past Medical History  Diagnosis Date  . Hypertension   . Hyperlipemia   . Deaf-mutism   . Glaucoma(365)     Dr Eulah Pont  . Diabetes mellitus     Type II, sees Dr. Candie Chroman  . Allergy   . Asthma     sees Dr [place] Callas  . Stroke 11-05-10    sees Dr. Pearlean Brownie    History  Substance Use Topics  . Smoking status: Former Smoker    Types: Cigarettes    Quit date: 12/21/1979  . Smokeless tobacco: Never Used  . Alcohol Use: No    Family History  Problem Relation Age of Onset  . Hypertension      family hx  . Coronary artery disease      female 1st degree relative  . Hyperlipidemia      family hx  . Diabetes      1st degree relative  . Diabetes Mother   . Heart disease Mother   . Hypertension Mother   . Hyperlipidemia Mother     No Known Allergies  Current outpatient prescriptions:albuterol (PROAIR HFA) 108 (90 BASE) MCG/ACT inhaler, Inhale into the lungs every 4 (four) hours as needed.  , Disp: , Rfl: ;  albuterol-ipratropium (COMBIVENT) 18-103 MCG/ACT inhaler, Inhale 2 puffs into the lungs 4 (four) times daily.  , Disp: , Rfl: ;  atenolol (TENORMIN) 50 MG tablet, Take 1 tablet (50 mg total) by mouth daily., Disp: 30 tablet, Rfl: 11 azelastine (ASTELIN) 137 MCG/SPRAY nasal spray, 1 spray by Nasal route 2 (two) times daily. Use in each nostril  as directed , Disp: , Rfl: ;  budesonide-formoterol (SYMBICORT) 160-4.5 MCG/ACT inhaler, Inhale 2 puffs into the lungs 2 (two) times daily.  , Disp: , Rfl: ;  citalopram (CELEXA) 20 MG tablet, Take 1 tablet (20 mg total) by mouth daily., Disp: 30 tablet, Rfl: 11 clopidogrel (PLAVIX) 75 MG tablet, Take 1 tablet (75 mg total) by mouth daily., Disp: 30 tablet, Rfl: 11;  fexofenadine (ALLEGRA) 180 MG tablet, Take 180 mg by mouth daily.  , Disp: , Rfl: ;  fluticasone (FLONASE) 50 MCG/ACT nasal spray, 2 sprays by Nasal route daily., Disp: 16 g, Rfl: 3;  Glucose Blood (BAYER BREEZE 2 TEST) DISK, 2 (two) times daily.  , Disp: , Rfl:  glucose blood test strip, 1 each by Other route 4 (four) times daily. Use as instructed, Disp: , Rfl: ;  hydrALAZINE (APRESOLINE) 50 MG tablet, Take 1 tablet (50 mg total) by mouth 2 (two) times daily., Disp: 60 tablet, Rfl: 11;  ibuprofen (ADVIL,MOTRIN) 800 MG tablet, Take 800 mg by mouth 3 (three) times daily as needed.  , Disp: , Rfl:  Insulin Aspart Prot & Aspart (NOVOLOG MIX 70/30 FLEXPEN [place]), Inject 15 Units into the skin 3 (three) times daily. , Disp: , Rfl: ;  insulin glargine (LANTUS) 100 UNIT/ML injection, Inject 8 Units into the skin at bedtime. , Disp: , Rfl: ;  latanoprost (XALATAN) 0.005 % ophthalmic solution, Place 1 drop into both eyes at bedtime.  , Disp: , Rfl: ;  montelukast (SINGULAIR) 10 MG tablet, Take 10 mg by mouth at bedtime.  , Disp: , Rfl:  pantoprazole (PROTONIX) 40 MG tablet, Take 1 tablet (40 mg total) by mouth daily., Disp: 30 tablet, Rfl: 11;  Pregabalin (LYRICA PO), Take by mouth daily. WAS RECENTLY PRESCRIBED DAUGHTER DOES NOT KNOW THE DOSE, Disp: , Rfl: ;  promethazine (PHENERGAN) 25 MG tablet, Take 1 tablet (25 mg total) by mouth every 4 (four) hours as needed for nausea., Disp: 60 tablet, Rfl: 11 rosuvastatin (CRESTOR) 40 MG tablet, Take 1 tablet (40 mg total) by mouth daily., Disp: 30 tablet, Rfl: 11;  lisinopril (PRINIVIL,ZESTRIL) 40 MG tablet, Take 1  tablet (40 mg total) by mouth daily., Disp: 30 tablet, Rfl: 11;  metFORMIN (GLUCOPHAGE) 1000 MG tablet, Take 1,000 mg by mouth 2 (two) times daily with meals.  , Disp: , Rfl:   BP 164/83  Pulse 71  Resp 16  Ht 5' (1.524 m)  Wt 147 lb (66.679 kg)  BMI 28.71 kg/m2  Body mass index is 28.71 kg/(m^2).          Review of Systems denies chest pain or orthopnea. Patient does have chronic deafness. Previous history of stroke with left facial weakness. Also has had TIAs not specified what symptoms were.    Objective:   Physical Exam blood pressure 164/83 heart rate 71 respirations 16 Gen.-alert and oriented x3 in no apparent distress HEENT normal for age-patient deaf  Lungs no rhonchi or wheezing Cardiovascular regular rhythm no murmurs carotid pulses 3+ palpable no bruits audible Abdomen soft nontender no palpable masses Musculoskeletal free of  major deformities Skin clear -no rashes Neurologic normal Lower extremities 3+ femoral  Pulses palpable bilaterally. No popliteal or distal pulses palpable. Her feet pink and well perfused. No infection, gangrene, or ulceration noted.  Today I ordered lower extremity arterial Dopplers which I reviewed and interpreted. ABIs are 0.58 on the right and 0.47 on the left with monophasic flow.      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

## 2012-12-12 DIAGNOSIS — Z0279 Encounter for issue of other medical certificate: Secondary | ICD-10-CM

## 2012-12-29 ENCOUNTER — Other Ambulatory Visit: Payer: Self-pay | Admitting: Family Medicine

## 2012-12-29 NOTE — Telephone Encounter (Signed)
Can we refill these? 

## 2012-12-29 NOTE — Telephone Encounter (Signed)
Refill all three for one year  

## 2012-12-30 ENCOUNTER — Encounter: Payer: Self-pay | Admitting: Family Medicine

## 2012-12-30 ENCOUNTER — Ambulatory Visit (INDEPENDENT_AMBULATORY_CARE_PROVIDER_SITE_OTHER): Payer: Medicare Other | Admitting: Family Medicine

## 2012-12-30 VITALS — BP 118/68 | HR 69 | Temp 98.4°F | Wt 127.0 lb

## 2012-12-30 DIAGNOSIS — Z209 Contact with and (suspected) exposure to unspecified communicable disease: Secondary | ICD-10-CM

## 2012-12-30 DIAGNOSIS — E119 Type 2 diabetes mellitus without complications: Secondary | ICD-10-CM

## 2012-12-30 DIAGNOSIS — Z0000 Encounter for general adult medical examination without abnormal findings: Secondary | ICD-10-CM

## 2012-12-30 LAB — LIPID PANEL
Cholesterol: 142 mg/dL (ref 0–200)
HDL: 44.8 mg/dL (ref >39.00)
Triglycerides: 188 mg/dL — ABNORMAL HIGH (ref 0.0–149.0)
VLDL: 37.6 mg/dL (ref 0.0–40.0)

## 2012-12-30 LAB — POCT URINALYSIS DIPSTICK
Bilirubin, UA: NEGATIVE
Leukocytes, UA: NEGATIVE
Nitrite, UA: NEGATIVE
Urobilinogen, UA: 0.2
pH, UA: 5.5

## 2012-12-30 LAB — BASIC METABOLIC PANEL
BUN: 10 mg/dL (ref 6–23)
Calcium: 8.5 mg/dL (ref 8.4–10.5)
GFR: 45.45 mL/min — ABNORMAL LOW (ref >60.00)
Glucose, Bld: 684 mg/dL (ref 70–99)

## 2012-12-30 LAB — HEPATIC FUNCTION PANEL
Albumin: 2.9 g/dL — ABNORMAL LOW (ref 3.5–5.2)
Total Protein: 6.7 g/dL (ref 6.0–8.3)

## 2012-12-30 LAB — HEMOGLOBIN A1C: Hgb A1c MFr Bld: 11.6 % — ABNORMAL HIGH (ref 4.6–6.5)

## 2012-12-30 MED ORDER — CLOPIDOGREL BISULFATE 75 MG PO TABS: 75.0000 mg | ORAL_TABLET | Freq: Every day | ORAL | Status: DC

## 2012-12-30 MED ORDER — AZELASTINE HCL 0.1 % NA SOLN: 1.0000 | Freq: Two times a day (BID) | NASAL | Status: DC

## 2012-12-30 MED ORDER — MONTELUKAST SODIUM 10 MG PO TABS: 10.0000 mg | ORAL_TABLET | Freq: Every day | ORAL | Status: DC

## 2012-12-30 MED ORDER — HYDRALAZINE HCL 50 MG PO TABS: 50.0000 mg | ORAL_TABLET | Freq: Two times a day (BID) | ORAL | Status: DC

## 2012-12-30 MED ORDER — ATENOLOL 50 MG PO TABS: 50.0000 mg | ORAL_TABLET | Freq: Every day | ORAL | Status: DC

## 2012-12-30 MED ORDER — FLUTICASONE PROPIONATE 50 MCG/ACT NA SUSP: 2.0000 | Freq: Every day | NASAL | Status: DC

## 2012-12-30 MED ORDER — PANTOPRAZOLE SODIUM 40 MG PO TBEC: 40.0000 mg | DELAYED_RELEASE_TABLET | Freq: Every day | ORAL | Status: DC

## 2012-12-30 MED ORDER — PREGABALIN 200 MG PO CAPS: 200.0000 mg | ORAL_CAPSULE | Freq: Two times a day (BID) | ORAL | Status: DC

## 2012-12-30 MED ORDER — BUDESONIDE-FORMOTEROL FUMARATE 160-4.5 MCG/ACT IN AERO: 2.0000 | INHALATION_SPRAY | Freq: Two times a day (BID) | RESPIRATORY_TRACT | Status: DC

## 2012-12-30 MED ORDER — ALBUTEROL SULFATE HFA 108 (90 BASE) MCG/ACT IN AERS: 2.0000 | INHALATION_SPRAY | RESPIRATORY_TRACT | Status: DC | PRN

## 2012-12-30 MED ORDER — IPRATROPIUM-ALBUTEROL 18-103 MCG/ACT IN AERO: 2.0000 | INHALATION_SPRAY | Freq: Four times a day (QID) | RESPIRATORY_TRACT | Status: DC

## 2012-12-30 MED ORDER — ROSUVASTATIN CALCIUM 40 MG PO TABS: 40.0000 mg | ORAL_TABLET | Freq: Every day | ORAL | Status: DC

## 2012-12-30 NOTE — Progress Notes (Signed)
  Subjective:    Patient ID: Valerie Rodriguez, female    DOB: Nov 09, 1950, 62 y.o.   MRN: [id number]  HPI 62 yr old female for a cpx. She seems to be doing well per her daughter except for some chronic pain in the legs and feet. She recently saw Dr. Hart Rochester who noted that she has peripheral arterial disease but felt that she was not a surgical candidate. She had been taking a low dose of Lyrica per Dr. Talmage Nap but this was not helping. Her glucoses have been stable with am fasting results in the range of 110 to 130. She is seeing Dr. Eliott Nine for her renal disease. Her creatinines have settled down from 3.9 last year to 1.27 a few months ago. She was to have had a repeat colonoscopy in 2011 but never did. She has been living with her daughter but they are now looking into moving her to an assisted living facility since her daughter works and has difficulty caring for her.    Review of Systems  Constitutional: Negative.   HENT: Negative.   Eyes: Negative.   Respiratory: Negative.   Cardiovascular: Negative.   Gastrointestinal: Negative.   Genitourinary: Negative for dysuria, urgency, frequency, hematuria, flank pain, decreased urine volume, enuresis, difficulty urinating, pelvic pain and dyspareunia.  Musculoskeletal: Negative.   Skin: Negative.   Neurological: Negative.   Psychiatric/Behavioral: Negative.        Objective:   Physical Exam  Constitutional: She is oriented to person, place, and time. She appears well-developed and well-nourished. No distress.  HENT:  Head: Normocephalic and atraumatic.  Right Ear: External ear normal.  Left Ear: External ear normal.  Nose: Nose normal.  Mouth/Throat: Oropharynx is clear and moist. No oropharyngeal exudate.  Eyes: Conjunctivae and EOM are normal. Pupils are equal, round, and reactive to light. No scleral icterus.  Neck: Normal range of motion. Neck supple. No JVD present. No thyromegaly present.  Cardiovascular: Normal rate, regular rhythm,  normal heart sounds and intact distal pulses.  Exam reveals no gallop and no friction rub.   No murmur heard. EKG normal   Pulmonary/Chest: Effort normal and breath sounds normal. No respiratory distress. She has no wheezes. She has no rales. She exhibits no tenderness.  Abdominal: Soft. Bowel sounds are normal. She exhibits no distension and no mass. There is no tenderness. There is no rebound and no guarding.  Musculoskeletal: Normal range of motion. She exhibits no edema and no tenderness.  Lymphadenopathy:    She has no cervical adenopathy.  Neurological: She is alert and oriented to person, place, and time. She has normal reflexes. No cranial nerve deficit. She exhibits normal muscle tone. Coordination normal.  Skin: Skin is warm and dry. No rash noted. No erythema.  Psychiatric: She has a normal mood and affect. Her behavior is normal. Judgment and thought content normal.          Assessment & Plan:  Well exam. Get fasting labs. Set up another colonoscopy. Increase Lyrica to 200 mg bid. She is given the first PPD today as part of a 2 step PPD assessment. We will fill out an FL-2 for her.

## 2012-12-31 LAB — CBC WITH DIFFERENTIAL/PLATELET
Basophils Relative: 0.3 % (ref 0.0–3.0)
Eosinophils Absolute: 0 10*3/uL (ref 0.0–0.7)
Lymphocytes Relative: 8.6 % — ABNORMAL LOW (ref 12.0–46.0)
MCHC: 32.4 g/dL (ref 30.0–36.0)
Monocytes Absolute: 0.4 10*3/uL (ref 0.1–1.0)
Neutrophils Relative %: 87 % — ABNORMAL HIGH (ref 43.0–77.0)
Platelets: 293 10*3/uL (ref 150.0–400.0)
RBC: 5.21 Mil/uL — ABNORMAL HIGH (ref 3.87–5.11)
WBC: 11.1 10*3/uL — ABNORMAL HIGH (ref 4.5–10.5)

## 2013-01-01 NOTE — Progress Notes (Signed)
Quick Note:  I spoke with daughter and gave results ______ 

## 2013-01-02 LAB — TB SKIN TEST
Induration: 0 mm
TB Skin Test: NEGATIVE

## 2013-01-02 NOTE — Addendum Note (Signed)
Addended by: Aniceto Boss A on: 01/02/2013 10:31 AM   Modules accepted: Orders

## 2013-01-09 ENCOUNTER — Inpatient Hospital Stay (HOSPITAL_COMMUNITY)
Admission: EM | Admit: 2013-01-09 | Discharge: 2013-01-16 | DRG: 638 | Disposition: A | Discharge status: Skilled Nursing Facility | Payer: Medicare Other | Attending: Internal Medicine | Admitting: Internal Medicine

## 2013-01-09 ENCOUNTER — Emergency Department (HOSPITAL_COMMUNITY): Payer: Medicare Other

## 2013-01-09 ENCOUNTER — Encounter (HOSPITAL_COMMUNITY): Payer: Self-pay | Admitting: Emergency Medicine

## 2013-01-09 DIAGNOSIS — M1990 Unspecified osteoarthritis, unspecified site: Secondary | ICD-10-CM

## 2013-01-09 DIAGNOSIS — A088 Other specified intestinal infections: Secondary | ICD-10-CM

## 2013-01-09 DIAGNOSIS — N390 Urinary tract infection, site not specified: Secondary | ICD-10-CM

## 2013-01-09 DIAGNOSIS — R269 Unspecified abnormalities of gait and mobility: Secondary | ICD-10-CM

## 2013-01-09 DIAGNOSIS — N189 Chronic kidney disease, unspecified: Secondary | ICD-10-CM | POA: Diagnosis present

## 2013-01-09 DIAGNOSIS — J45909 Unspecified asthma, uncomplicated: Secondary | ICD-10-CM

## 2013-01-09 DIAGNOSIS — S9030XA Contusion of unspecified foot, initial encounter: Secondary | ICD-10-CM | POA: Diagnosis present

## 2013-01-09 DIAGNOSIS — K219 Gastro-esophageal reflux disease without esophagitis: Secondary | ICD-10-CM

## 2013-01-09 DIAGNOSIS — H409 Unspecified glaucoma: Secondary | ICD-10-CM | POA: Diagnosis present

## 2013-01-09 DIAGNOSIS — I639 Cerebral infarction, unspecified: Secondary | ICD-10-CM

## 2013-01-09 DIAGNOSIS — R4182 Altered mental status, unspecified: Secondary | ICD-10-CM

## 2013-01-09 DIAGNOSIS — J069 Acute upper respiratory infection, unspecified: Secondary | ICD-10-CM

## 2013-01-09 DIAGNOSIS — E1110 Type 2 diabetes mellitus with ketoacidosis without coma: Secondary | ICD-10-CM

## 2013-01-09 DIAGNOSIS — R110 Nausea: Secondary | ICD-10-CM

## 2013-01-09 DIAGNOSIS — Y92009 Unspecified place in unspecified non-institutional (private) residence as the place of occurrence of the external cause: Secondary | ICD-10-CM

## 2013-01-09 DIAGNOSIS — M79604 Pain in right leg: Secondary | ICD-10-CM

## 2013-01-09 DIAGNOSIS — G43009 Migraine without aura, not intractable, without status migrainosus: Secondary | ICD-10-CM

## 2013-01-09 DIAGNOSIS — Z7902 Long term (current) use of antithrombotics/antiplatelets: Secondary | ICD-10-CM

## 2013-01-09 DIAGNOSIS — I70219 Atherosclerosis of native arteries of extremities with intermittent claudication, unspecified extremity: Secondary | ICD-10-CM

## 2013-01-09 DIAGNOSIS — Z9119 Patient's noncompliance with other medical treatment and regimen: Secondary | ICD-10-CM

## 2013-01-09 DIAGNOSIS — Z8673 Personal history of transient ischemic attack (TIA), and cerebral infarction without residual deficits: Secondary | ICD-10-CM

## 2013-01-09 DIAGNOSIS — R600 Localized edema: Secondary | ICD-10-CM

## 2013-01-09 DIAGNOSIS — I129 Hypertensive chronic kidney disease with stage 1 through stage 4 chronic kidney disease, or unspecified chronic kidney disease: Secondary | ICD-10-CM | POA: Diagnosis present

## 2013-01-09 DIAGNOSIS — I10 Essential (primary) hypertension: Secondary | ICD-10-CM

## 2013-01-09 DIAGNOSIS — I959 Hypotension, unspecified: Secondary | ICD-10-CM

## 2013-01-09 DIAGNOSIS — E860 Dehydration: Secondary | ICD-10-CM | POA: Diagnosis present

## 2013-01-09 DIAGNOSIS — H913 Deaf nonspeaking, not elsewhere classified: Secondary | ICD-10-CM | POA: Diagnosis present

## 2013-01-09 DIAGNOSIS — E785 Hyperlipidemia, unspecified: Secondary | ICD-10-CM

## 2013-01-09 DIAGNOSIS — M79609 Pain in unspecified limb: Secondary | ICD-10-CM

## 2013-01-09 DIAGNOSIS — B961 Klebsiella pneumoniae [K. pneumoniae] as the cause of diseases classified elsewhere: Secondary | ICD-10-CM | POA: Diagnosis present

## 2013-01-09 DIAGNOSIS — J209 Acute bronchitis, unspecified: Secondary | ICD-10-CM

## 2013-01-09 DIAGNOSIS — Z91199 Patient's noncompliance with other medical treatment and regimen due to unspecified reason: Secondary | ICD-10-CM

## 2013-01-09 DIAGNOSIS — H9190 Unspecified hearing loss, unspecified ear: Secondary | ICD-10-CM

## 2013-01-09 DIAGNOSIS — E1310 Other specified diabetes mellitus with ketoacidosis without coma: Principal | ICD-10-CM | POA: Diagnosis present

## 2013-01-09 DIAGNOSIS — E119 Type 2 diabetes mellitus without complications: Secondary | ICD-10-CM

## 2013-01-09 DIAGNOSIS — W19XXXA Unspecified fall, initial encounter: Secondary | ICD-10-CM | POA: Diagnosis present

## 2013-01-09 DIAGNOSIS — Z87891 Personal history of nicotine dependence: Secondary | ICD-10-CM

## 2013-01-09 DIAGNOSIS — I739 Peripheral vascular disease, unspecified: Secondary | ICD-10-CM | POA: Diagnosis present

## 2013-01-09 DIAGNOSIS — Z794 Long term (current) use of insulin: Secondary | ICD-10-CM

## 2013-01-09 DIAGNOSIS — Z79899 Other long term (current) drug therapy: Secondary | ICD-10-CM

## 2013-01-09 DIAGNOSIS — Z8679 Personal history of other diseases of the circulatory system: Secondary | ICD-10-CM

## 2013-01-09 DIAGNOSIS — N3000 Acute cystitis without hematuria: Secondary | ICD-10-CM

## 2013-01-09 HISTORY — DX: Peripheral vascular disease, unspecified: I73.9

## 2013-01-09 LAB — COMPREHENSIVE METABOLIC PANEL
Albumin: 3.2 g/dL — ABNORMAL LOW (ref 3.5–5.2)
BUN: 27 mg/dL — ABNORMAL HIGH (ref 6–23)
Creatinine, Ser: 1.82 mg/dL — ABNORMAL HIGH (ref 0.50–1.10)
Total Protein: 7.6 g/dL (ref 6.0–8.3)

## 2013-01-09 LAB — POCT I-STAT 3, VENOUS BLOOD GAS (G3P V)
O2 Saturation: 85 %
pCO2, Ven: 43.9 mmHg — ABNORMAL LOW (ref 45.0–50.0)
pO2, Ven: 51 mmHg — ABNORMAL HIGH (ref 30.0–45.0)

## 2013-01-09 LAB — PROTIME-INR: INR: 1.08 (ref 0.00–1.49)

## 2013-01-09 LAB — CBC
HCT: 38.4 % (ref 36.0–46.0)
Hemoglobin: 12.7 g/dL (ref 12.0–15.0)
MCH: 24.8 pg — ABNORMAL LOW (ref 26.0–34.0)
MCHC: 33.1 g/dL (ref 30.0–36.0)
RDW: 16.6 % — ABNORMAL HIGH (ref 11.5–15.5)

## 2013-01-09 LAB — URINE MICROSCOPIC-ADD ON

## 2013-01-09 LAB — DIFFERENTIAL
Basophils Relative: 0 % (ref 0–1)
Eosinophils Absolute: 0 10*3/uL (ref 0.0–0.7)
Monocytes Absolute: 0.5 10*3/uL (ref 0.1–1.0)
Monocytes Relative: 6 % (ref 3–12)
Neutrophils Relative %: 84 % — ABNORMAL HIGH (ref 43–77)

## 2013-01-09 LAB — GLUCOSE, CAPILLARY
Glucose-Capillary: >600 mg/dL (ref 70–99)
Glucose-Capillary: >600 mg/dL (ref 70–99)

## 2013-01-09 LAB — URINALYSIS, ROUTINE W REFLEX MICROSCOPIC
Ketones, ur: 15 mg/dL — AB
Leukocytes, UA: NEGATIVE
Nitrite: POSITIVE — AB
Specific Gravity, Urine: 1.026 (ref 1.005–1.030)
pH: 5 (ref 5.0–8.0)

## 2013-01-09 LAB — TROPONIN I: Troponin I: <0.3 ng/mL (ref <0.30)

## 2013-01-09 MED ORDER — INSULIN REGULAR HUMAN 100 UNIT/ML IJ SOLN
INTRAMUSCULAR | Status: DC
  Administered 2013-01-09: 7.4 [IU]/h via INTRAVENOUS
  Filled 2013-01-09: qty 1

## 2013-01-09 MED ORDER — SODIUM CHLORIDE 0.9 % IV BOLUS (SEPSIS)
1000.0000 mL | Freq: Once | INTRAVENOUS | Status: AC
  Administered 2013-01-09: 1000 mL via INTRAVENOUS

## 2013-01-09 MED ORDER — SODIUM CHLORIDE 0.9 % IV SOLN: 1000.0000 mL | INTRAVENOUS | Status: DC

## 2013-01-09 MED ORDER — SODIUM CHLORIDE 0.9 % IV BOLUS (SEPSIS)
1000.0000 mL | Freq: Once | INTRAVENOUS | Status: AC
  Administered 2013-01-10: 1000 mL via INTRAVENOUS

## 2013-01-09 MED ORDER — DEXTROSE-NACL 5-0.45 % IV SOLN: INTRAVENOUS | Status: DC

## 2013-01-09 NOTE — ED Notes (Signed)
Daughter reports pt has had altered LOC all day.  States that she is not following commands and has been lethargic. Last known well yesterday.  Pt is deaf and normally signs but daughter reports she has not signed today.  Daughter signing to pt and asked her what was wrong.  Pt signed for "nothing".  Daughter asked pt her name and where she was at and pt attempting to sign but did not answer questions.  Bilateral grips weak.  CBG reading high.

## 2013-01-10 ENCOUNTER — Emergency Department (HOSPITAL_COMMUNITY): Payer: Medicare Other

## 2013-01-10 DIAGNOSIS — E1110 Type 2 diabetes mellitus with ketoacidosis without coma: Secondary | ICD-10-CM | POA: Diagnosis present

## 2013-01-10 LAB — CBC WITH DIFFERENTIAL/PLATELET
Basophils Relative: 0 % (ref 0–1)
Eosinophils Absolute: 0 10*3/uL (ref 0.0–0.7)
Eosinophils Relative: 0 % (ref 0–5)
HCT: 36.6 % (ref 36.0–46.0)
Hemoglobin: 12.3 g/dL (ref 12.0–15.0)
Lymphs Abs: 2.4 10*3/uL (ref 0.7–4.0)
MCH: 24.8 pg — ABNORMAL LOW (ref 26.0–34.0)
MCHC: 33.6 g/dL (ref 30.0–36.0)
MCV: 73.8 fL — ABNORMAL LOW (ref 78.0–100.0)
Monocytes Absolute: 0.8 10*3/uL (ref 0.1–1.0)
Monocytes Relative: 6 % (ref 3–12)
Neutrophils Relative %: 73 % (ref 43–77)
RBC: 4.96 MIL/uL (ref 3.87–5.11)

## 2013-01-10 LAB — MRSA PCR SCREENING: MRSA by PCR: POSITIVE — AB

## 2013-01-10 LAB — BASIC METABOLIC PANEL
BUN: 17 mg/dL (ref 6–23)
BUN: 14 mg/dL (ref 6–23)
CO2: 22 mEq/L (ref 19–32)
CO2: 24 mEq/L (ref 19–32)
CO2: 23 mEq/L (ref 19–32)
Calcium: 8.6 mg/dL (ref 8.4–10.5)
Chloride: 123 mEq/L — ABNORMAL HIGH (ref 96–112)
GFR calc non Af Amer: 45 mL/min — ABNORMAL LOW (ref >90)
Glucose, Bld: 160 mg/dL — ABNORMAL HIGH (ref 70–99)
Glucose, Bld: 188 mg/dL — ABNORMAL HIGH (ref 70–99)
Glucose, Bld: 167 mg/dL — ABNORMAL HIGH (ref 70–99)
Potassium: 3.1 mEq/L — ABNORMAL LOW (ref 3.5–5.1)
Potassium: 3.1 mEq/L — ABNORMAL LOW (ref 3.5–5.1)
Sodium: 157 mEq/L — ABNORMAL HIGH (ref 135–145)
Sodium: 151 mEq/L — ABNORMAL HIGH (ref 135–145)

## 2013-01-10 LAB — GLUCOSE, CAPILLARY
Glucose-Capillary: 288 mg/dL — ABNORMAL HIGH (ref 70–99)
Glucose-Capillary: 523 mg/dL — ABNORMAL HIGH (ref 70–99)
Glucose-Capillary: 149 mg/dL — ABNORMAL HIGH (ref 70–99)
Glucose-Capillary: 172 mg/dL — ABNORMAL HIGH (ref 70–99)
Glucose-Capillary: 181 mg/dL — ABNORMAL HIGH (ref 70–99)
Glucose-Capillary: 236 mg/dL — ABNORMAL HIGH (ref 70–99)

## 2013-01-10 LAB — HEMOGLOBIN A1C: Mean Plasma Glucose: 344 mg/dL — ABNORMAL HIGH (ref <117)

## 2013-01-10 MED ORDER — IPRATROPIUM BROMIDE 0.02 % IN SOLN
0.5000 mg | Freq: Four times a day (QID) | RESPIRATORY_TRACT | Status: DC
  Administered 2013-01-10: 0.5 mg via RESPIRATORY_TRACT
  Filled 2013-01-10: qty 2.5

## 2013-01-10 MED ORDER — INSULIN ASPART 100 UNIT/ML ~~LOC~~ SOLN
0.0000 [IU] | Freq: Every day | SUBCUTANEOUS | Status: DC
  Administered 2013-01-10: 2 [IU] via SUBCUTANEOUS
  Administered 2013-01-11: 3 [IU] via SUBCUTANEOUS

## 2013-01-10 MED ORDER — CLOPIDOGREL BISULFATE 75 MG PO TABS
75.0000 mg | ORAL_TABLET | Freq: Every day | ORAL | Status: DC
  Administered 2013-01-10 – 2013-01-16 (×7): 75 mg via ORAL
  Filled 2013-01-10 (×9): qty 1

## 2013-01-10 MED ORDER — INSULIN ASPART 100 UNIT/ML ~~LOC~~ SOLN
0.0000 [IU] | Freq: Three times a day (TID) | SUBCUTANEOUS | Status: DC
  Administered 2013-01-10: 2 [IU] via SUBCUTANEOUS
  Administered 2013-01-10 (×2): 3 [IU] via SUBCUTANEOUS
  Administered 2013-01-11: 2 [IU] via SUBCUTANEOUS
  Administered 2013-01-11 – 2013-01-12 (×4): 8 [IU] via SUBCUTANEOUS
  Administered 2013-01-12: 3 [IU] via SUBCUTANEOUS
  Administered 2013-01-13: 5 [IU] via SUBCUTANEOUS
  Administered 2013-01-13 (×2): 3 [IU] via SUBCUTANEOUS
  Administered 2013-01-14: 8 [IU] via SUBCUTANEOUS
  Administered 2013-01-15: 3 [IU] via SUBCUTANEOUS
  Administered 2013-01-15 – 2013-01-16 (×2): 5 [IU] via SUBCUTANEOUS
  Administered 2013-01-16: 2 [IU] via SUBCUTANEOUS

## 2013-01-10 MED ORDER — PREGABALIN 100 MG PO CAPS: 100.0000 mg | ORAL_CAPSULE | Freq: Two times a day (BID) | ORAL | Status: DC

## 2013-01-10 MED ORDER — DEXTROSE 5 % IV SOLN
INTRAVENOUS | Status: DC
  Administered 2013-01-10: 1000 mL via INTRAVENOUS

## 2013-01-10 MED ORDER — GLUCERNA SHAKE PO LIQD
237.0000 mL | ORAL | Status: DC
  Administered 2013-01-10 – 2013-01-16 (×7): 237 mL via ORAL

## 2013-01-10 MED ORDER — CITALOPRAM HYDROBROMIDE 20 MG PO TABS
20.0000 mg | ORAL_TABLET | Freq: Every day | ORAL | Status: DC
  Administered 2013-01-10 – 2013-01-16 (×7): 20 mg via ORAL
  Filled 2013-01-10 (×7): qty 1

## 2013-01-10 MED ORDER — CHLORHEXIDINE GLUCONATE CLOTH 2 % EX PADS
6.0000 | MEDICATED_PAD | Freq: Every day | CUTANEOUS | Status: DC
  Administered 2013-01-10 – 2013-01-12 (×3): 6 via TOPICAL

## 2013-01-10 MED ORDER — SODIUM CHLORIDE 0.9 % IV SOLN
INTRAVENOUS | Status: AC
  Administered 2013-01-10: 1000 mL via INTRAVENOUS

## 2013-01-10 MED ORDER — DEXTROSE 5 % IV SOLN
1.0000 g | INTRAVENOUS | Status: DC
  Administered 2013-01-10 – 2013-01-12 (×3): 1 g via INTRAVENOUS
  Filled 2013-01-10 (×4): qty 10

## 2013-01-10 MED ORDER — INSULIN REGULAR BOLUS VIA INFUSION
0.0000 [IU] | Freq: Three times a day (TID) | INTRAVENOUS | Status: DC
Start: 2013-01-10 — End: 2013-01-10
  Filled 2013-01-10: qty 10

## 2013-01-10 MED ORDER — ALBUTEROL SULFATE (5 MG/ML) 0.5% IN NEBU: 2.5000 mg | INHALATION_SOLUTION | Freq: Four times a day (QID) | RESPIRATORY_TRACT | Status: DC | PRN

## 2013-01-10 MED ORDER — SODIUM CHLORIDE 0.9 % IV BOLUS (SEPSIS)
500.0000 mL | Freq: Once | INTRAVENOUS | Status: AC
  Administered 2013-01-10: 500 mL via INTRAVENOUS

## 2013-01-10 MED ORDER — SODIUM CHLORIDE 0.9 % IV SOLN
INTRAVENOUS | Status: DC
  Administered 2013-01-10: 2.3 [IU]/h via INTRAVENOUS
  Filled 2013-01-10: qty 1

## 2013-01-10 MED ORDER — MUPIROCIN 2 % EX OINT
1.0000 "application " | TOPICAL_OINTMENT | Freq: Two times a day (BID) | CUTANEOUS | Status: AC
  Administered 2013-01-10 – 2013-01-14 (×10): 1 via NASAL
  Filled 2013-01-10: qty 22

## 2013-01-10 MED ORDER — ENOXAPARIN SODIUM 30 MG/0.3ML ~~LOC~~ SOLN
30.0000 mg | SUBCUTANEOUS | Status: DC
  Administered 2013-01-10 – 2013-01-15 (×5): 30 mg via SUBCUTANEOUS
  Filled 2013-01-10 (×7): qty 0.3

## 2013-01-10 MED ORDER — ONDANSETRON HCL 4 MG/2ML IJ SOLN
4.0000 mg | Freq: Four times a day (QID) | INTRAMUSCULAR | Status: DC | PRN
  Filled 2013-01-10: qty 2

## 2013-01-10 MED ORDER — SODIUM CHLORIDE 0.9 % IJ SOLN
3.0000 mL | Freq: Two times a day (BID) | INTRAMUSCULAR | Status: DC
  Administered 2013-01-10 – 2013-01-16 (×11): 3 mL via INTRAVENOUS

## 2013-01-10 MED ORDER — DEXTROSE 50 % IV SOLN: 25.0000 mL | INTRAVENOUS | Status: DC | PRN

## 2013-01-10 MED ORDER — DEXTROSE-NACL 5-0.45 % IV SOLN
INTRAVENOUS | Status: DC
  Administered 2013-01-10: 1000 mL via INTRAVENOUS

## 2013-01-10 MED ORDER — BUDESONIDE-FORMOTEROL FUMARATE 160-4.5 MCG/ACT IN AERO
2.0000 | INHALATION_SPRAY | Freq: Two times a day (BID) | RESPIRATORY_TRACT | Status: DC
  Administered 2013-01-10 – 2013-01-15 (×10): 2 via RESPIRATORY_TRACT
  Filled 2013-01-10 (×3): qty 6

## 2013-01-10 MED ORDER — PREGABALIN 100 MG PO CAPS
200.0000 mg | ORAL_CAPSULE | Freq: Two times a day (BID) | ORAL | Status: DC
  Administered 2013-01-10: 200 mg via ORAL
  Filled 2013-01-10: qty 8
  Filled 2013-01-10 (×2): qty 4

## 2013-01-10 MED ORDER — ADULT MULTIVITAMIN W/MINERALS CH
1.0000 | ORAL_TABLET | Freq: Every day | ORAL | Status: DC
  Administered 2013-01-10 – 2013-01-16 (×7): 1 via ORAL
  Filled 2013-01-10 (×7): qty 1

## 2013-01-10 MED ORDER — ONDANSETRON HCL 4 MG/2ML IJ SOLN: 4.0000 mg | Freq: Three times a day (TID) | INTRAMUSCULAR | Status: DC | PRN

## 2013-01-10 MED ORDER — INSULIN GLARGINE 100 UNIT/ML ~~LOC~~ SOLN
10.0000 [IU] | Freq: Every day | SUBCUTANEOUS | Status: DC
  Administered 2013-01-10: 10 [IU] via SUBCUTANEOUS
  Filled 2013-01-10 (×2): qty 0.1

## 2013-01-10 MED ORDER — ATORVASTATIN CALCIUM 40 MG PO TABS
40.0000 mg | ORAL_TABLET | Freq: Every day | ORAL | Status: DC
  Administered 2013-01-10 – 2013-01-15 (×5): 40 mg via ORAL
  Filled 2013-01-10 (×7): qty 1

## 2013-01-10 MED ORDER — ONDANSETRON HCL 4 MG PO TABS: 4.0000 mg | ORAL_TABLET | Freq: Four times a day (QID) | ORAL | Status: DC | PRN

## 2013-01-10 MED ORDER — DOCUSATE SODIUM 100 MG PO CAPS
100.0000 mg | ORAL_CAPSULE | Freq: Two times a day (BID) | ORAL | Status: DC
  Administered 2013-01-10 – 2013-01-16 (×11): 100 mg via ORAL
  Filled 2013-01-10 (×14): qty 1

## 2013-01-10 MED ORDER — ALBUTEROL SULFATE (5 MG/ML) 0.5% IN NEBU
2.5000 mg | INHALATION_SOLUTION | Freq: Four times a day (QID) | RESPIRATORY_TRACT | Status: DC
  Administered 2013-01-10: 2.5 mg via RESPIRATORY_TRACT
  Filled 2013-01-10: qty 0.5

## 2013-01-10 MED ORDER — IPRATROPIUM BROMIDE 0.02 % IN SOLN: 0.5000 mg | Freq: Four times a day (QID) | RESPIRATORY_TRACT | Status: DC | PRN

## 2013-01-10 MED ORDER — PREGABALIN 100 MG PO CAPS
200.0000 mg | ORAL_CAPSULE | Freq: Two times a day (BID) | ORAL | Status: DC
  Administered 2013-01-10 – 2013-01-16 (×11): 200 mg via ORAL
  Filled 2013-01-10: qty 2
  Filled 2013-01-10: qty 8
  Filled 2013-01-10 (×2): qty 4
  Filled 2013-01-10: qty 2
  Filled 2013-01-10: qty 4
  Filled 2013-01-10 (×3): qty 2
  Filled 2013-01-10 (×2): qty 4

## 2013-01-10 MED ORDER — SODIUM CHLORIDE 0.45 % IV SOLN
INTRAVENOUS | Status: DC
  Administered 2013-01-10: 1000 mL via INTRAVENOUS

## 2013-01-10 MED ORDER — INFLUENZA VIRUS VACC SPLIT PF IM SUSP
0.5000 mL | INTRAMUSCULAR | Status: AC
  Administered 2013-01-11: 0.5 mL via INTRAMUSCULAR
  Filled 2013-01-10: qty 0.5

## 2013-01-10 MED ORDER — ATENOLOL 50 MG PO TABS
50.0000 mg | ORAL_TABLET | Freq: Every day | ORAL | Status: DC
  Administered 2013-01-10 – 2013-01-11 (×2): 50 mg via ORAL
  Filled 2013-01-10 (×2): qty 1

## 2013-01-10 MED ORDER — DEXTROSE 5 % IV SOLN
1.0000 g | Freq: Once | INTRAVENOUS | Status: AC
  Administered 2013-01-10: 1 g via INTRAVENOUS
  Filled 2013-01-10: qty 10

## 2013-01-10 MED ORDER — POLYETHYLENE GLYCOL 3350 17 G PO PACK
17.0000 g | PACK | Freq: Every day | ORAL | Status: DC | PRN
  Administered 2013-01-10: 17 g via ORAL
  Filled 2013-01-10: qty 1

## 2013-01-10 MED ORDER — LATANOPROST 0.005 % OP SOLN
1.0000 [drp] | Freq: Every day | OPHTHALMIC | Status: DC
  Administered 2013-01-10 – 2013-01-15 (×6): 1 [drp] via OPHTHALMIC
  Filled 2013-01-10: qty 2.5

## 2013-01-10 MED ORDER — MORPHINE SULFATE 2 MG/ML IJ SOLN
1.0000 mg | INTRAMUSCULAR | Status: DC | PRN
  Administered 2013-01-10 – 2013-01-13 (×3): 1 mg via INTRAVENOUS
  Filled 2013-01-10 (×3): qty 1

## 2013-01-10 NOTE — Progress Notes (Signed)
[facility] Progress Note [place] TEAM 1 - Stepdown/ICU TEAM   Valerie Rodriguez NWG:[id number] DOB: [birth year] DOA: 01/09/2013 PCP: Nelwyn Salisbury, MD  Brief narrative: Patient is a 62 year old female with past medical history most significant for insulin-dependent diabetes, deaf mutism since birth, asthma, chronic kidney disease, peripheral vascular disease, history of stroke, hypertension, hyperlipidemia and glaucoma comes in with chief complaints of altered mental status. She lives with her daughter who has noticed that she has become sluggish in last 2-3 days. Today that is on the day of admission the daughter noticed that patient was not responding and conversing as well as usual. She also noticed decreased oral intake. Patient also complained of belly pain which was generalized with no exacerbating or relieving factors and start her on the morning of admission. No fever, chills, chest pain, shortness of breath, difficulty in urination noted. Patient does have chronic constipation. Daughter also noticed that patient has been noncompliant with her medication since last 2-3 days.Pt has been admitted for DKA.   Assessment/Plan: Principal Problem:   DKA (diabetic ketoacidoses) -Resolved and now on home doses of insulin -Question of her being compliant with medication -Hemoglobin A1c significantly elevated at 13.6 -Daughter requesting that the patient be considered for SNF  Active Problems:     HYPERLIPIDEMIA -Continue statin    DEAFNESS    HYPOTENSION WITH HISTORY OF HYPERTENSION -BP low and patient being needed to be given multiple fluid boluses  UTI -Continue Rocephin and followup on culture    ASTHMA -Currently stable    Stroke -Continue Plavix    Atherosclerosis of native arteries of the extremities with intermittent claudication    Code Status: Full code Family Communication: None Disposition Plan: Per message from daughter she would like patient to be  evaluated for SNF  Consultants: None  Procedures: None  Antibiotics: Rocephin  DVT prophylaxis: Lovenox  HPI/Subjective: Patient quite sleepy but awakens easily. She is noncommunicative and it's difficult to tell if she is able to understand me her read my lips   Objective: Blood pressure 116/64, pulse 74, temperature 98.5 F (36.9 C), temperature source Oral, resp. rate 16, height 4\' 11"  (1.499 m), weight 45.36 kg (100 lb), SpO2 95.00%.  Intake/Output Summary (Last 24 hours) at 01/10/13 1701 Last data filed at 01/10/13 1600  Gross per 24 hour  Intake 2172.09 ml  Output   1450 ml  Net 722.09 ml     Exam: General: No acute respiratory distress Lungs: Clear to auscultation bilaterally without wheezes or crackles Cardiovascular: Regular rate and rhythm without murmur gallop or rub normal S1 and S2 Abdomen: Nontender, nondistended, soft, bowel sounds positive, no rebound, no ascites, no appreciable mass Extremities: No significant cyanosis, clubbing, or edema bilateral lower extremities  Data Reviewed: Basic Metabolic Panel:  Recent Labs Lab 01/09/13 2209 01/10/13 0257 01/10/13 0612  NA 148* 157* 156*  K 4.3 3.2* 3.1*  CL 108 123* 119*  CO2 22 22 24   GLUCOSE 800* 160* 188*  BUN 27* 20 17  CREATININE 1.82* 1.38* 1.27*  CALCIUM 9.6 8.6 8.7   Liver Function Tests:  Recent Labs Lab 01/09/13 2209  AST 13  ALT 9  ALKPHOS 96  BILITOT 0.4  PROT 7.6  ALBUMIN 3.2*   No results found for this basename: LIPASE, AMYLASE,  in the last 168 hours No results found for this basename: AMMONIA,  in the last 168 hours CBC:  Recent Labs Lab 01/09/13 2209 01/10/13 0612  WBC 8.0 11.7*  NEUTROABS 6.7 8.5*  HGB 12.7 12.3  HCT 38.4 36.6  MCV 74.9* 73.8*  PLT 342 279   Cardiac Enzymes:  Recent Labs Lab 01/09/13 2212 01/10/13 0257 01/10/13 0853 01/10/13 1355  TROPONINI <0.30 <0.30 <0.30 <0.30   BNP (last 3 results) No results found for this basename:  PROBNP,  in the last 8760 hours CBG:  Recent Labs Lab 01/10/13 0512 01/10/13 0614 01/10/13 0706 01/10/13 0815 01/10/13 1209  GLUCAP 172* 173* 181* 140* 171*    Recent Results (from the past 240 hour(s))  MRSA PCR SCREENING     Status: Abnormal   Collection Time    01/10/13  2:15 AM      Result Value Range Status   MRSA by PCR POSITIVE (*) NEGATIVE Final   Comment:            The GeneXpert MRSA Assay (FDA     approved for NASAL specimens     only), is one component of a     comprehensive MRSA colonization     surveillance program. It is not     intended to diagnose MRSA     infection nor to guide or     monitor treatment for     MRSA infections.     RESULT CALLED TO, READ BACK BY AND VERIFIED WITH:     Sheliah Plane [id number] WILDERK     Studies:  Recent x-ray studies have been reviewed in detail by the Attending Physician  Scheduled Meds:  Scheduled Meds: . atenolol  50 mg Oral Daily  . atorvastatin  40 mg Oral q1800  . budesonide-formoterol  2 puff Inhalation BID  . cefTRIAXone (ROCEPHIN)  IV  1 g Intravenous Q24H  . Chlorhexidine Gluconate Cloth  6 each Topical Q0600  . citalopram  20 mg Oral Daily  . clopidogrel  75 mg Oral Q breakfast  . docusate sodium  100 mg Oral BID  . enoxaparin (LOVENOX) injection  30 mg Subcutaneous Q24H  . feeding supplement  237 mL Oral Q24H  . [START ON 01/11/2013] influenza  inactive virus vaccine  0.5 mL Intramuscular Tomorrow-1000  . insulin aspart  0-15 Units Subcutaneous TID WC  . insulin aspart  0-5 Units Subcutaneous QHS  . insulin glargine  10 Units Subcutaneous QHS  . latanoprost  1 drop Both Eyes QHS  . multivitamin with minerals  1 tablet Oral Daily  . mupirocin ointment  1 application Nasal BID  . pregabalin  200 mg Oral BID  . sodium chloride  3 mL Intravenous Q12H   Continuous Infusions: . dextrose 1,000 mL (01/10/13 0804)  . dextrose 5 % and 0.45% NaCl Stopped (01/10/13 0804)    Time spent on care of this  patient: 35 minutes   [facility]  [facility] Office  [phone] Pager - Text Page per Loretha Stapler as per below:  On-Call/Text Page:      [email or web address]      password [email or web address]  If 7PM-7AM, please contact night-coverage [email or web address] Password [email or web address] 01/10/2013, 5:01 PM   LOS: 1 day

## 2013-01-10 NOTE — Progress Notes (Signed)
Made Rizwan, MD aware of only 25mL output in last 4 hours despite a significant about of IV fluids and PO intake; BP still soft in the 90s/50s.  Lungs clear, no edema.  Orders received to give 500cc bolus.  Will continue to monitor.  Salomon Mast, RN

## 2013-01-10 NOTE — ED Provider Notes (Signed)
History     CSN: [id number]  Arrival date & time 01/09/13  2117   First MD Initiated Contact with Patient 01/09/13 2150      Chief Complaint  Patient presents with  . Altered Mental Status    (Consider location/radiation/quality/duration/timing/severity/associated sxs/prior treatment) HPI Comments: LEVEL 5 CAVEAT - PATENT HAS ALTERED MENTAL STATUS AND IS DEAF.  PT with hx of IDDM brought in by daughter, who helps with sign language with cc of AMS> Per daughter, patient has been vbery sluggish since earlier today. She has noticed decreased po intake and increased lethargy and somnolence.e no recent infection, no chest pain that patient has complained of no recent trauma/falls.   The history is provided by the patient, medical records and a caregiver. The history is limited by the condition of the patient and a language barrier.    Past Medical History  Diagnosis Date  . Hypertension   . Hyperlipemia   . Deaf-mutism   . Glaucoma(365)     Dr Eulah Pont  . Diabetes mellitus     Type II, sees Dr. Candie Chroman  . Allergy   . Asthma     sees Dr Brooklet Callas  . Stroke 11-05-10    sees Dr. Pearlean Brownie  . Chronic kidney disease     sees Dr. Camille Bal   . PVD (peripheral vascular disease)     sees Dr. Hart Rochester   . PAD (peripheral artery disease)     Past Surgical History  Procedure Laterality Date  . Cholecystectomy    . Abdominal hysterectomy      oophorectomy 1976  . Rotator cuff repair  2004  . Colonoscopy  03-30-09    per Dr. Marina Goodell, poor prep, repeat one yr     Family History  Problem Relation Age of Onset  . Hypertension      family hx  . Coronary artery disease      female 1st degree relative  . Hyperlipidemia      family hx  . Diabetes      1st degree relative  . Diabetes Mother   . Heart disease Mother   . Hypertension Mother   . Hyperlipidemia Mother     History  Substance Use Topics  . Smoking status: Former Smoker    Types: Cigarettes    Quit date:  12/21/1979  . Smokeless tobacco: Never Used  . Alcohol Use: No    OB History   Grav Para Term Preterm Abortions TAB SAB Ect Mult Living                  Review of Systems  Unable to perform ROS: Mental status change    Allergies  Review of patient's allergies indicates no known allergies.  Home Medications   Current Outpatient Rx  Name  Route  Sig  Dispense  Refill  . citalopram (CELEXA) 20 MG tablet   Oral   Take 20 mg by mouth daily.         Marland Kitchen albuterol (PROVENTIL HFA;VENTOLIN HFA) 108 (90 BASE) MCG/ACT inhaler   Inhalation   Inhale 2 puffs into the lungs every 4 (four) hours as needed for wheezing or shortness of breath.         Marland Kitchen albuterol-ipratropium (COMBIVENT) 18-103 MCG/ACT inhaler   Inhalation   Inhale 2 puffs into the lungs 4 (four) times daily.         Marland Kitchen atenolol (TENORMIN) 50 MG tablet   Oral   Take 50 mg by mouth daily.         Marland Kitchen  azelastine (ASTELIN) 137 MCG/SPRAY nasal spray   Nasal   Place 1 spray into the nose 2 (two) times daily. Use in each nostril as directed         . budesonide-formoterol (SYMBICORT) 160-4.5 MCG/ACT inhaler   Inhalation   Inhale 2 puffs into the lungs 2 (two) times daily.         . clopidogrel (PLAVIX) 75 MG tablet   Oral   Take 75 mg by mouth daily.         . fluticasone (FLONASE) 50 MCG/ACT nasal spray   Nasal   Place 2 sprays into the nose daily.   16 g   11   . Glucose Blood (BAYER BREEZE 2 TEST) DISK      2 (two) times daily.           Marland Kitchen glucose blood test strip   Other   1 each by Other route 4 (four) times daily. Use as instructed         . hydrALAZINE (APRESOLINE) 50 MG tablet   Oral   Take 50 mg by mouth 2 (two) times daily.         . Insulin Aspart Prot & Aspart (NOVOLOG MIX 70/30 FLEXPEN [place])   Subcutaneous   Inject 15 Units into the skin 3 (three) times daily.          . insulin glargine (LANTUS) 100 UNIT/ML injection   Subcutaneous   Inject 8 Units into the skin at  bedtime.          Marland Kitchen latanoprost (XALATAN) 0.005 % ophthalmic solution   Both Eyes   Place 1 drop into both eyes at bedtime.           . montelukast (SINGULAIR) 10 MG tablet   Oral   Take 1 tablet (10 mg total) by mouth at bedtime.   30 tablet   11   . pantoprazole (PROTONIX) 40 MG tablet   Oral   Take 1 tablet (40 mg total) by mouth daily.   30 tablet   11   . pregabalin (LYRICA) 200 MG capsule   Oral   Take 1 capsule (200 mg total) by mouth 2 (two) times daily.   60 capsule   11   . promethazine (PHENERGAN) 25 MG tablet   Oral   Take 25 mg by mouth every 4 (four) hours as needed for nausea.         . rosuvastatin (CRESTOR) 40 MG tablet   Oral   Take 1 tablet (40 mg total) by mouth daily.   30 tablet   11     BP 143/71  Pulse 127  Temp(Src) 98.8 F (37.1 C) (Oral)  Resp 20  SpO2 95%  Physical Exam  Nursing note and vitals reviewed. Constitutional: She is oriented to person, place, and time. She appears well-developed and well-nourished.  HENT:  Head: Normocephalic and atraumatic.  Eyes: EOM are normal. Pupils are equal, round, and reactive to light.  Neck: Neck supple.  Cardiovascular: Regular rhythm and normal heart sounds.   Pulmonary/Chest: Effort normal. No respiratory distress.  Abdominal: Soft. She exhibits no distension. There is tenderness. There is no rebound and no guarding.  Diffuse tenderness, mostly in the upper quadrants  Neurological: She is alert and oriented to person, place, and time.  Skin: Skin is warm and dry.    ED Course  Procedures (including critical care time)  Labs Reviewed  GLUCOSE, CAPILLARY - Abnormal; Notable for the following:  Glucose-Capillary >600 (*)    All other components within normal limits  CBC - Abnormal; Notable for the following:    RBC 5.13 (*)    MCV 74.9 (*)    MCH 24.8 (*)    RDW 16.6 (*)    All other components within normal limits  DIFFERENTIAL - Abnormal; Notable for the following:     Neutrophils Relative 84 (*)    Lymphocytes Relative 10 (*)    All other components within normal limits  COMPREHENSIVE METABOLIC PANEL - Abnormal; Notable for the following:    Sodium 148 (*)    Glucose, Bld 800 (*)    BUN 27 (*)    Creatinine, Ser 1.82 (*)    Albumin 3.2 (*)    GFR calc non Af Amer 29 (*)    GFR calc Af Amer 33 (*)    All other components within normal limits  URINALYSIS, ROUTINE W REFLEX MICROSCOPIC - Abnormal; Notable for the following:    APPearance CLOUDY (*)    Glucose, UA >1000 (*)    Hgb urine dipstick TRACE (*)    Ketones, ur 15 (*)    Nitrite POSITIVE (*)    All other components within normal limits  GLUCOSE, CAPILLARY - Abnormal; Notable for the following:    Glucose-Capillary >600 (*)    All other components within normal limits  URINE MICROSCOPIC-ADD ON - Abnormal; Notable for the following:    Bacteria, UA MANY (*)    All other components within normal limits  GLUCOSE, CAPILLARY - Abnormal; Notable for the following:    Glucose-Capillary >600 (*)    All other components within normal limits  POCT I-STAT 3, BLOOD GAS (G3P V) - Abnormal; Notable for the following:    pH, Ven 7.366 (*)    pCO2, Ven 43.9 (*)    pO2, Ven 51.0 (*)    Bicarbonate 25.2 (*)    All other components within normal limits  URINE CULTURE  PROTIME-INR  APTT  TROPONIN I   Ct Head (brain) Wo Contrast  01/09/2013  *RADIOLOGY REPORT*  Clinical Data: Altered mental status, lethargic, not following commands, deaf patient not signing today  CT HEAD WITHOUT CONTRAST  Technique:  Contiguous axial images were obtained from the base of the skull through the vertex without contrast.  Comparison: 11/05/2010  Findings: Generalized atrophy. Stable mild ex vacuo dilatation of the ventricular system. No midline shift or mass effect. Small vessel chronic ischemic changes of deep cerebral white matter. Old lacunar infarcts at left caudate head and left thalamus. No intracranial hemorrhage, mass  lesion, or evidence of acute infarction. No extra-axial fluid collections. Bones and sinuses unremarkable.  IMPRESSION: Atrophy with small vessel chronic ischemic changes of deep cerebral white matter. Old lacunar infarcts left caudate head and left thalamus. No acute intracranial abnormalities.   Original Report Authenticated By: Ulyses Southward, M.D.      1. DKA (diabetic ketoacidoses)   2. Urosepsis   3. Altered mental status       MDM   Date: 01/10/2013  Rate: 124  Rhythm: sinus tachycardia  QRS Axis: normal  Intervals: normal  ST/T Wave abnormalities: nonspecific ST/T changes  Conduction Disutrbances:none  Narrative Interpretation:   Old EKG Reviewed: unchanged  DDx: Sepsis syndrome ACS syndrome DKA ICH Stroke CHF exacerbation COPD exacerbation Infection - pneumonia/UTI/Cellulitis PE Dehydration Electrolyte abnormality Tox syndrome  Pt comes in with cc of AMS. Pt has hx of IDDM, hx of poor control and noted to have high sugars  at arrival. She is also tachycardic, and slightly tachypneic. Pt has some abd tenderness on exam, diffuse, with mild guarding - otherwise exam is non focal.  Concerns are for DKA. Will get basic labs to screen for underlying source, Will get AAS as patient has been constipated, has surgery hx, has abd pain - to ensure there is no SBO.  12:36 AM Labs confirm anion gap dka, with mild Cr elevation. Pt will be started on DKa protocol, with insulin drip. Family notified of the diagnosis.  CRITICAL CARE Performed by: Derwood Kaplan   Total critical care time: 30 minutes. DKA, requiring insulin drip, and having underlying UTI. Patient has altered mental status, and is deaf -  and so difficult to obtain good history.  Critical care time was exclusive of separately billable procedures and treating other patients.  Critical care was necessary to treat or prevent imminent or life-threatening deterioration.  Critical care was time spent personally  by me on the following activities: development of treatment plan with patient and/or surrogate as well as nursing, discussions with consultants, evaluation of patient's response to treatment, examination of patient, obtaining history from patient or surrogate, ordering and performing treatments and interventions, ordering and review of laboratory studies, ordering and review of radiographic studies, pulse oximetry and re-evaluation of patient's condition.  Derwood Kaplan, MD 01/10/13 [phone]

## 2013-01-10 NOTE — Progress Notes (Signed)
Paged Butler Denmark, MD to clarify upcoming SSI/glucostabilizer orders.  Ok to turn off insulin gtt at 0800 (10 units lantus given at 0701).  Also told to replace D51/2NS with D5W at 75/hr.  Ok to start SSI at 0800.  Will continue to monitor.  Salomon Mast, RN

## 2013-01-10 NOTE — Progress Notes (Signed)
Pt seems more lethargic during 1200 rounding.  It took a very hard sternal rub to arouse the pt, at which she took a few sips of her Glucerna and then fell back asleep.  Pupils equal, round, reactive.  UTA orientation d/t pt being deaf and mute.  BP 110/61, HR 60s, O2 96-100% on RA, RR 14.  Helmut Muster, MD aware.  No new orders received, just continue to monitor, as well as call daughter to get a feel of her baseline.  Called [name], pts daughter, and told her about pt mentation.  Daughter stated that is how she was yesterday before admission.  Will continue to monitor.  Salomon Mast, RN

## 2013-01-10 NOTE — Progress Notes (Signed)
Admitting MD paged.M. Burnadette Peter, NP notified.

## 2013-01-10 NOTE — H&P (Signed)
History and Physical  LEESHA VENO ZOX:[id number] DOB: 04-03-1951 DOA: 01/09/2013  Referring physician: Derwood Kaplan MD(ER) PCP: Nelwyn Salisbury, MD   Chief Complaint: Altered mental status  HPI: Patient is a 62 year old female with past medical history most significant for insulin-dependent diabetes, deaf mutism since birth, asthma, chronic kidney disease, peripheral vascular disease, history of stroke, hypertension, hyperlipidemia and glaucoma comes in with chief complaints of altered mental status. She lives with her daughter who has noticed that she has become sluggish in last 2-3 days. Today that is on the day of admission the daughter noticed that patient was not responding and conversing as well as usual. She also noticed decreased oral intake. Patient also complained of belly pain which was generalized with no exacerbating or relieving factors and start her on the morning of admission. No fever, chills, chest pain, shortness of breath, difficulty in urination noted. Patient does have chronic constipation. Daughter also noticed that patient has been noncompliant with her medication since last 2-3 days.  ER evaluation was suggestive of 800 glucose, ketones in urine and anion gap of 18. Patient was admitted for DKA.   Review of Systems:  15 point review of system is negative except what is noted above.  Past Medical History  Diagnosis Date  . Hypertension   . Hyperlipemia   . Deaf-mutism   . Glaucoma(365)     Dr Eulah Pont  . Diabetes mellitus     Type II, sees Dr. Candie Chroman  . Allergy   . Asthma     sees Dr [place] Callas  . Stroke 11-05-10    sees Dr. Pearlean Brownie  . Chronic kidney disease     sees Dr. Camille Bal   . PVD (peripheral vascular disease)     sees Dr. Hart [place]   . PAD (peripheral artery disease)     Past Surgical History  Procedure Laterality Date  . Cholecystectomy    . Abdominal hysterectomy      oophorectomy 1976  . Rotator cuff repair  2004  . Colonoscopy   03-30-09    per Dr. Marina Goodell, poor prep, repeat one yr     Social History:  reports that she quit smoking about 33 years ago. Her smoking use included Cigarettes. She smoked 0.00 packs per day. She has never used smokeless tobacco. She reports that she does not drink alcohol or use illicit drugs.  No Known Allergies  Family History  Problem Relation Age of Onset  . Hypertension      family hx  . Coronary artery disease      female 1st degree relative  . Hyperlipidemia      family hx  . Diabetes      1st degree relative  . Diabetes Mother   . Heart disease Mother   . Hypertension Mother   . Hyperlipidemia Mother      Prior to Admission medications   Medication Sig Start Date End Date Taking? Authorizing Provider  citalopram (CELEXA) 20 MG tablet Take 20 mg by mouth daily.   Yes Historical Provider, MD  albuterol (PROVENTIL HFA;VENTOLIN HFA) 108 (90 BASE) MCG/ACT inhaler Inhale 2 puffs into the lungs every 4 (four) hours as needed for wheezing or shortness of breath. 12/30/12   Nelwyn Salisbury, MD  albuterol-ipratropium (COMBIVENT) [phone] MCG/ACT inhaler Inhale 2 puffs into the lungs 4 (four) times daily. 12/30/12   Nelwyn Salisbury, MD  atenolol (TENORMIN) 50 MG tablet Take 50 mg by mouth daily. 12/30/12   Nelwyn Salisbury, MD  azelastine (ASTELIN) 137 MCG/SPRAY nasal spray Place 1 spray into the nose 2 (two) times daily. Use in each nostril as directed 12/30/12   Nelwyn Salisbury, MD  budesonide-formoterol [facility]) 160-4.5 MCG/ACT inhaler Inhale 2 puffs into the lungs 2 (two) times daily. 12/30/12   Nelwyn Salisbury, MD  clopidogrel (PLAVIX) 75 MG tablet Take 75 mg by mouth daily. 12/30/12   Nelwyn Salisbury, MD  fluticasone (FLONASE) 50 MCG/ACT nasal spray Place 2 sprays into the nose daily. 12/30/12   Nelwyn Salisbury, MD  Glucose Blood (BAYER BREEZE 2 TEST) DISK 2 (two) times daily.      Historical Provider, MD  glucose blood test strip 1 each by Other route 4 (four) times daily. Use as instructed     Historical Provider, MD  hydrALAZINE (APRESOLINE) 50 MG tablet Take 50 mg by mouth 2 (two) times daily. 12/30/12   Nelwyn Salisbury, MD  Insulin Aspart Prot & Aspart (NOVOLOG MIX 70/30 FLEXPEN [place]) Inject 15 Units into the skin 3 (three) times daily.     Historical Provider, MD  insulin glargine (LANTUS) 100 UNIT/ML injection Inject 8 Units into the skin at bedtime.     Historical Provider, MD  latanoprost (XALATAN) 0.005 % ophthalmic solution Place 1 drop into both eyes at bedtime.      Historical Provider, MD  montelukast (SINGULAIR) 10 MG tablet Take 1 tablet (10 mg total) by mouth at bedtime. 12/30/12   Nelwyn Salisbury, MD  pantoprazole (PROTONIX) 40 MG tablet Take 1 tablet (40 mg total) by mouth daily. 12/30/12   Nelwyn Salisbury, MD  pregabalin (LYRICA) 200 MG capsule Take 1 capsule (200 mg total) by mouth 2 (two) times daily. 12/30/12   Nelwyn Salisbury, MD  promethazine (PHENERGAN) 25 MG tablet Take 25 mg by mouth every 4 (four) hours as needed for nausea. 12/14/11   Nelwyn Salisbury, MD  rosuvastatin (CRESTOR) 40 MG tablet Take 1 tablet (40 mg total) by mouth daily. 12/30/12   Nelwyn Salisbury, MD   Physical Exam: Filed Vitals:   01/09/13 2140  BP: 143/71  Pulse: 127  Temp: 98.8 F (37.1 C)  TempSrc: Oral  Resp: 20  SpO2: 95%   Physical Exam: General: Vital signs reviewed and noted. Well-developed, well-nourished, in no acute distress; alert and cooperative throughout examination.patient does seem a bit sluggish in her movements   Head: Normocephalic, atraumatic.  Eyes: PERRL, EOMI, No signs of anemia or jaundince.  Nose: Mucous membranes moist, not inflammed, nonerythematous.  Throat: Oropharynx nonerythematous, no exudate appreciated.   Neck: No deformities, masses, or tenderness noted.Supple, No carotid Bruits, no JVD.  Lungs:  Normal respiratory effort. Clear to auscultation BL without crackles or wheezes.  Heart:  tachycardic but regular RR. S1 and S2 normal without gallop, murmur, or rubs.   Abdomen:  BS normoactive. Soft, Nondistended, non-tender.  No masses or organomegaly.  Extremities: No pretibial edema.  Neurologic: A&O X3, CN II - XII are grossly intact. Motor strength is 5/5 in the all 4 extremities, Sensations intact to light touch, Cerebellar signs negative.  Skin: No visible rashes, scars.     Wt Readings from Last 3 Encounters:  12/30/12 127 lb (57.607 kg)  09/16/12 147 lb (66.679 kg)  12/21/11 133 lb (60.328 kg)    Labs on Admission:  Basic Metabolic Panel:  Recent Labs Lab 01/09/13 2209  NA 148*  K 4.3  CL 108  CO2 22  GLUCOSE 800*  BUN 27*  CREATININE  1.82*  CALCIUM 9.6    Liver Function Tests:  Recent Labs Lab 01/09/13 2209  AST 13  ALT 9  ALKPHOS 96  BILITOT 0.4  PROT 7.6  ALBUMIN 3.2*   CBC:  Recent Labs Lab 01/09/13 2209  WBC 8.0  NEUTROABS 6.7  HGB 12.7  HCT 38.4  MCV 74.9*  PLT 342    Cardiac Enzymes:  Recent Labs Lab 01/09/13 2212  TROPONINI <0.30    CBG:  Recent Labs Lab 01/09/13 2132 01/09/13 2213 01/09/13 2338  GLUCAP >600* >600* >600*     Radiological Exams on Admission: Ct Head (brain) Wo Contrast  01/09/2013  *RADIOLOGY REPORT*  Clinical Data: Altered mental status, lethargic, not following commands, deaf patient not signing today  CT HEAD WITHOUT CONTRAST  Technique:  Contiguous axial images were obtained from the base of the skull through the vertex without contrast.  Comparison: 11/05/2010  Findings: Generalized atrophy. Stable mild ex vacuo dilatation of the ventricular system. No midline shift or mass effect. Small vessel chronic ischemic changes of deep cerebral white matter. Old lacunar infarcts at left caudate head and left thalamus. No intracranial hemorrhage, mass lesion, or evidence of acute infarction. No extra-axial fluid collections. Bones and sinuses unremarkable.  IMPRESSION: Atrophy with small vessel chronic ischemic changes of deep cerebral white matter. Old lacunar infarcts left  caudate head and left thalamus. No acute intracranial abnormalities.   Original Report Authenticated By: Ulyses Southward, M.D.    Dg Abd Acute W/chest  01/10/2013  *RADIOLOGY REPORT*  Clinical Data: Evaluate for bowel obstruction or pneumonia.  ACUTE ABDOMEN SERIES (ABDOMEN 2 VIEW & CHEST 1 VIEW)  Comparison: Abdominal radiograph 11/08/2010.  Chest x-ray 11/09/2010.  Findings: Lung volumes are slightly low.  No definite consolidative airspace disease.  No pleural effusions.  No evidence of pulmonary edema.  Heart size is normal. The patient is rotated to the left on today's exam, resulting in distortion of the mediastinal contours and reduced diagnostic sensitivity and specificity for mediastinal pathology.  Atherosclerosis in the thoracic aorta.  Supine and left lateral decubitus views of the abdomen demonstrate gas and stool scattered throughout the colon extending to the level of the distal rectum.  There are multiple nondilated gas filled loops of small bowel throughout the central abdomen which are conspicuous, but are nonspecific.  No pneumoperitoneum.  Surgical clips project over the right upper quadrant of the abdomen, likely from prior cholecystectomy.  IMPRESSION: 1.  Nonspecific, nonobstructive bowel gas pattern, as above. 2.  Surgical clips in the right upper quadrant of the abdomen, likely from prior cholecystectomy. 3.  Low lung volumes without radiographic evidence of acute cardiopulmonary disease. 4.  Atherosclerosis.   Original Report Authenticated By: Trudie Reed, M.D.     EKG: Independently reviewed. 124 beats per minute, normal axis, sinus rhythm, ST depression noted diffusely in inferior leads and anterior and lateral precordial leads. This ST depression is new as compared to EKG done on March 18   Principal Problem:   DKA (diabetic ketoacidoses) Active Problems:   DIABETES MELLITUS, TYPE II   HYPERLIPIDEMIA   DEAFNESS   HYPERTENSION   ASTHMA   Stroke   Atherosclerosis of native  arteries of the extremities with intermittent claudication   Assessment/Plan Patient is a 62 year old female with past medical history as noted above but most significant for diabetes which is insulin-dependent. Patient is noncompliant last 2-3 days with her insulin and other medications and currently being admitted to the hospital for diabetic ketoacidosis. -Admit to step  down -Glucose stabilizer -Every 3 hours basic metabolic profile to follow anion gap and potassium -N.p.o. for now -Start Rocephin for empiric urinary tract infection -Obtain urine cultures -CBC with differential in the morning -Check HbA1c -Consult to diabetes educator for teaching regarding DKA  Abdominal pain: Most likely secondary from DKA. Other differentials would include ACS, urinary tract infection. -Morphine for pain control -Rocephin 1 g IV daily x3 days -Urine culture  EKG changes: Patient has EKG changes consistent with ischemia which may be secondary to the acute stress from DKA. -Cycle cardiac enzymes x3 -Continue morning EKG -Continue Plavix, Crestor and atenolol -review EKG in AM -Lovenox for DVT px -No indication for heparin drip or nitrate with no chest pain  Hypertension: Hold antihypertensives at this time  Stroke: Continue plavix   Code Status: Full code Family Communication: Daughter updated at bedside who was also a Museum/gallery curator Disposition Plan/Anticipated LOS: Guarded  Time spent: 75 minutes  Lars Mage, MD  [facility] Team 5  If 7PM-7AM, please contact night-coverage at [email or web address], password [email or web address] 01/10/2013, 1:23 AM

## 2013-01-10 NOTE — Progress Notes (Signed)
INITIAL NUTRITION ASSESSMENT  DOCUMENTATION CODES Per approved criteria  -Not Applicable   INTERVENTION: 1. MVI daily 2. Glucerna Shake po daily, each supplement provides 220 kcal and 10 grams of protein. 3. RD to continue to follow nutrition care plan  NUTRITION DIAGNOSIS: Inadequate oral intake related to poor appetite as evidenced by staff report.   Goal: Intake to meet >90% of estimated nutrition needs.  Monitor:  weight trends, lab trends, I/O's, PO intake, supplement tolerance  Reason for Assessment: Malnutrition Screening  62 y.o. female  Admitting Dx: DKA (diabetic ketoacidoses)  ASSESSMENT: Per daughter, pt has been sluggish x 2-3 days, noncompliant with medications and decreased oral intake. Work-up reveals DKA with admit of glucose of 800.   Pt is deaf, prefers sign language.  Pt is currently ordered for Carbohydrate Modified Medium meals. Intake is poor, pt refused breakfast per nurse tech.  Unable to obtain current weight. Per weight hx, weight has been declining overtime. Pt is at risk for malnutrition and would benefit from oral nutrition supplementation.  Height: Ht Readings from Last 1 Encounters:  01/10/13 4\' 11"  (1.499 m)    Weight: Wt Readings from Last 1 Encounters:  12/30/12 127 lb (57.607 kg)  Unable to obtain new weight, bedscale reports 99 lb.  Ideal Body Weight: 98 lb  % Ideal Body Weight: 129%  Wt Readings from Last 10 Encounters:  12/30/12 127 lb (57.607 kg)  09/16/12 147 lb (66.679 kg)  12/21/11 133 lb (60.328 kg)  12/14/11 128 lb (58.06 kg)  07/24/10 183 lb (83.008 kg)  02/07/10 186 lb (84.369 kg)  01/04/10 184 lb (83.462 kg)  09/27/09 183 lb (83.008 kg)  04/29/09 178 lb (80.74 kg)  02/18/09 181 lb (82.101 kg)    Usual Body Weight: 147 lb  % Usual Body Weight: 86%  BMI:  25.6 - overweight  Estimated Nutritional Needs: Kcal: 1300 - 1500  Protein: 60 - 70 grams Fluid: 1.5 - 1.8 liters  Skin: intact  Diet Order:  Carb Control Medium  EDUCATION NEEDS: -No education needs identified at this time   Intake/Output Summary (Last 24 hours) at 01/10/13 0948 Last data filed at 01/10/13 0807  Gross per 24 hour  Intake 425.09 ml  Output   1225 ml  Net -799.91 ml    Last BM: 3/29  Labs:   Recent Labs Lab 01/09/13 2209 01/10/13 0257 01/10/13 0612  NA 148* 157* 156*  K 4.3 3.2* 3.1*  CL 108 123* 119*  CO2 22 22 24   BUN 27* 20 17  CREATININE 1.82* 1.38* 1.27*  CALCIUM 9.6 8.6 8.7  GLUCOSE 800* 160* 188*    CBG (last 3)   Recent Labs  01/10/13 0512 01/10/13 0614 01/10/13 0706  GLUCAP 172* 173* 181*    Scheduled Meds: . sodium chloride   Intravenous STAT  . atenolol  50 mg Oral Daily  . atorvastatin  40 mg Oral q1800  . budesonide-formoterol  2 puff Inhalation BID  . cefTRIAXone (ROCEPHIN)  IV  1 g Intravenous Q24H  . Chlorhexidine Gluconate Cloth  6 each Topical Q0600  . citalopram  20 mg Oral Daily  . clopidogrel  75 mg Oral Q breakfast  . docusate sodium  100 mg Oral BID  . enoxaparin (LOVENOX) injection  30 mg Subcutaneous Q24H  . [START ON 01/11/2013] influenza  inactive virus vaccine  0.5 mL Intramuscular Tomorrow-1000  . insulin aspart  0-15 Units Subcutaneous TID WC  . insulin aspart  0-5 Units Subcutaneous QHS  .  insulin glargine  10 Units Subcutaneous QHS  . latanoprost  1 drop Both Eyes QHS  . mupirocin ointment  1 application Nasal BID  . pregabalin  200 mg Oral BID  . sodium chloride  3 mL Intravenous Q12H    Continuous Infusions: . dextrose 1,000 mL (01/10/13 0804)  . dextrose 5 % and 0.45% NaCl Stopped (01/10/13 0804)    Past Medical History  Diagnosis Date  . Hypertension   . Hyperlipemia   . Deaf-mutism   . Glaucoma(365)     Dr Eulah Pont  . Diabetes mellitus     Type II, sees Dr. Candie Chroman  . Allergy   . Asthma     sees Dr [place] Callas  . Stroke 11-05-10    sees Dr. Pearlean Brownie  . Chronic kidney disease     sees Dr. Camille Bal   . PVD (peripheral  vascular disease)     sees Dr. Hart [place]   . PAD (peripheral artery disease)     Past Surgical History  Procedure Laterality Date  . Cholecystectomy    . Abdominal hysterectomy      oophorectomy 1976  . Rotator cuff repair  2004  . Colonoscopy  03-30-09    per Dr. Marina Goodell, poor prep, repeat one yr     Jarold Motto MS, RD, LDN Pager: [phone] After-hours pager: [phone]

## 2013-01-10 NOTE — Progress Notes (Signed)
1320: Pt BP 80s/40s on L arm, 100/63 on R arm.  Also received atenolol 50mg  at 1000.  Helmut Muster, MD aware.  Orders received to give bolus of NS.  Will continue to monitor.  1430: pt much more alert at this point, ate over half of lunch with assistance.    Salomon Mast, RN

## 2013-01-11 DIAGNOSIS — N390 Urinary tract infection, site not specified: Secondary | ICD-10-CM | POA: Diagnosis present

## 2013-01-11 DIAGNOSIS — I959 Hypotension, unspecified: Secondary | ICD-10-CM | POA: Diagnosis present

## 2013-01-11 LAB — BASIC METABOLIC PANEL
BUN: 14 mg/dL (ref 6–23)
CO2: 21 mEq/L (ref 19–32)
Chloride: 102 mEq/L (ref 96–112)
Glucose, Bld: 247 mg/dL — ABNORMAL HIGH (ref 70–99)
Potassium: 3.2 mEq/L — ABNORMAL LOW (ref 3.5–5.1)
Sodium: 136 mEq/L (ref 135–145)

## 2013-01-11 LAB — CBC
HCT: 31.2 % — ABNORMAL LOW (ref 36.0–46.0)
Hemoglobin: 10.3 g/dL — ABNORMAL LOW (ref 12.0–15.0)
MCH: 24.6 pg — ABNORMAL LOW (ref 26.0–34.0)
MCHC: 33 g/dL (ref 30.0–36.0)
MCV: 74.6 fL — ABNORMAL LOW (ref 78.0–100.0)
RBC: 4.18 MIL/uL (ref 3.87–5.11)

## 2013-01-11 LAB — GLUCOSE, CAPILLARY
Glucose-Capillary: 273 mg/dL — ABNORMAL HIGH (ref 70–99)
Glucose-Capillary: 150 mg/dL — ABNORMAL HIGH (ref 70–99)

## 2013-01-11 MED ORDER — ATENOLOL 50 MG PO TABS
50.0000 mg | ORAL_TABLET | Freq: Every day | ORAL | Status: DC
  Administered 2013-01-12 – 2013-01-16 (×5): 50 mg via ORAL
  Filled 2013-01-11 (×5): qty 1

## 2013-01-11 MED ORDER — POTASSIUM CHLORIDE CRYS ER 20 MEQ PO TBCR
40.0000 meq | EXTENDED_RELEASE_TABLET | Freq: Two times a day (BID) | ORAL | Status: DC
  Administered 2013-01-11 – 2013-01-13 (×6): 40 meq via ORAL
  Filled 2013-01-11 (×8): qty 2

## 2013-01-11 MED ORDER — INSULIN GLARGINE 100 UNIT/ML ~~LOC~~ SOLN
15.0000 [IU] | Freq: Every day | SUBCUTANEOUS | Status: DC
  Administered 2013-01-11: 15 [IU] via SUBCUTANEOUS
  Filled 2013-01-11: qty 0.15

## 2013-01-11 MED ORDER — SODIUM CHLORIDE 0.9 % IV SOLN
INTRAVENOUS | Status: DC
  Administered 2013-01-11: 09:00:00 via INTRAVENOUS

## 2013-01-11 NOTE — Progress Notes (Addendum)
[facility] Progress Note [place] TEAM 1 - Stepdown/ICU TEAM   Valerie Rodriguez ZOX:[id number] DOB: February 12, 1951 DOA: 01/09/2013 PCP: Nelwyn Salisbury, MD  Brief narrative: Patient is a 62 year old female with past medical history most significant for insulin-dependent diabetes, deaf mutism since birth, asthma, chronic kidney disease, peripheral vascular disease, history of stroke, hypertension, hyperlipidemia and glaucoma comes in with chief complaints of altered mental status. She lives with her daughter who has noticed that she has become sluggish in last 2-3 days. Today that is on the day of admission the daughter noticed that patient was not responding and conversing as well as usual. She also noticed decreased oral intake. Patient also complained of belly pain which was generalized with no exacerbating or relieving factors and start her on the morning of admission. No fever, chills, chest pain, shortness of breath, difficulty in urination noted. Patient does have chronic constipation. Daughter also noticed that patient has been noncompliant with her medication since last 2-3 days.Pt has been admitted for DKA.   Assessment/Plan: Principal Problem:   DKA (diabetic ketoacidoses) -Resolved -Question of her being compliant with medication -Hemoglobin A1c significantly elevated at 13.6 - increasing Lantus dose today -Daughter requesting that the patient be considered for SNF  Active Problems:     HYPERLIPIDEMIA -Continue statin    DEAFNESS    HYPOTENSION WITH HISTORY OF HYPERTENSION -BP improving with multiple fluid boluses  UTI -Continue Rocephin  -culture reveals gr neg rods    ASTHMA -Currently stable    Stroke -Continue Plavix    Atherosclerosis of native arteries of the extremities with intermittent claudication    Code Status: Full code Family Communication: None Disposition Plan: Transfer to med/surg Per message from daughter she would like patient to be  evaluated for SNF  Consultants: None  Procedures: None  Antibiotics: Rocephin  DVT prophylaxis: Lovenox  HPI/Subjective: Patient more alert today- difficult to communicate with but apparently she was able to communicate via sign language with daughter   Objective: Blood pressure 133/67, pulse 64, temperature 98.1 F (36.7 C), temperature source Oral, resp. rate 15, height 4\' 11"  (1.499 m), weight 43.2 kg (95 lb 3.8 oz), SpO2 98.00%.  Intake/Output Summary (Last 24 hours) at 01/11/13 1917 Last data filed at 01/11/13 1800  Gross per 24 hour  Intake   2590 ml  Output   2128 ml  Net    462 ml     Exam: General: No acute respiratory distress Lungs: Clear to auscultation bilaterally without wheezes or crackles Cardiovascular: Regular rate and rhythm without murmur gallop or rub normal S1 and S2 Abdomen: Nontender, nondistended, soft, bowel sounds positive, no rebound, no ascites, no appreciable mass Extremities: No significant cyanosis, clubbing, or edema bilateral lower extremities  Data Reviewed: Basic Metabolic Panel:  Recent Labs Lab 01/09/13 2209 01/10/13 0257 01/10/13 0612 01/10/13 1355 01/11/13 0620 01/11/13 0824  NA 148* 157* 156* 151* 136  --   K 4.3 3.2* 3.1* 3.1* 3.2*  --   CL 108 123* 119* 117* 102  --   CO2 22 22 24 23 21   --   GLUCOSE 800* 160* 188* 167* 247*  --   BUN 27* 20 17 14 14   --   CREATININE 1.82* 1.38* 1.27* 1.28* 1.37*  --   CALCIUM 9.6 8.6 8.7 8.3* 7.9*  --   MG  --   --   --   --   --  1.5   Liver Function Tests:  Recent Labs Lab 01/09/13 2209  AST 13  ALT 9  ALKPHOS 96  BILITOT 0.4  PROT 7.6  ALBUMIN 3.2*   No results found for this basename: LIPASE, AMYLASE,  in the last 168 hours No results found for this basename: AMMONIA,  in the last 168 hours CBC:  Recent Labs Lab 01/09/13 2209 01/10/13 0612 01/11/13 0620  WBC 8.0 11.7* 9.4  NEUTROABS 6.7 8.5*  --   HGB 12.7 12.3 10.3*  HCT 38.4 36.6 31.2*  MCV 74.9*  73.8* 74.6*  PLT 342 279 188   Cardiac Enzymes:  Recent Labs Lab 01/09/13 2212 01/10/13 0257 01/10/13 0853 01/10/13 1355  TROPONINI <0.30 <0.30 <0.30 <0.30   BNP (last 3 results) No results found for this basename: PROBNP,  in the last 8760 hours CBG:  Recent Labs Lab 01/10/13 1653 01/10/13 2137 01/11/13 0835 01/11/13 1204 01/11/13 1708  GLUCAP 154* 236* 274* 273* 150*    Recent Results (from the past 240 hour(s))  URINE CULTURE     Status: None   Collection Time    01/09/13 10:42 PM      Result Value Range Status   Specimen Description URINE, CATHETERIZED   Final   Special Requests NONE   Final   Culture  Setup Time 01/10/2013 03:34   Final   Colony Count >=100,000 COLONIES/ML   Final   Culture GRAM NEGATIVE RODS   Final   Report Status PENDING   Incomplete  MRSA PCR SCREENING     Status: Abnormal   Collection Time    01/10/13  2:15 AM      Result Value Range Status   MRSA by PCR POSITIVE (*) NEGATIVE Final   Comment:            The GeneXpert MRSA Assay (FDA     approved for NASAL specimens     only), is one component of a     comprehensive MRSA colonization     surveillance program. It is not     intended to diagnose MRSA     infection nor to guide or     monitor treatment for     MRSA infections.     RESULT CALLED TO, READ BACK BY AND VERIFIED WITH:     Sheliah Plane [id number] WILDERK     Studies:  Recent x-ray studies have been reviewed in detail by the Attending Physician  Scheduled Meds:  Scheduled Meds: . atenolol  50 mg Oral Daily  . atorvastatin  40 mg Oral q1800  . budesonide-formoterol  2 puff Inhalation BID  . cefTRIAXone (ROCEPHIN)  IV  1 g Intravenous Q24H  . Chlorhexidine Gluconate Cloth  6 each Topical Q0600  . citalopram  20 mg Oral Daily  . clopidogrel  75 mg Oral Q breakfast  . docusate sodium  100 mg Oral BID  . enoxaparin (LOVENOX) injection  30 mg Subcutaneous Q24H  . feeding supplement  237 mL Oral Q24H  . insulin aspart   0-15 Units Subcutaneous TID WC  . insulin aspart  0-5 Units Subcutaneous QHS  . insulin glargine  15 Units Subcutaneous QHS  . latanoprost  1 drop Both Eyes QHS  . multivitamin with minerals  1 tablet Oral Daily  . mupirocin ointment  1 application Nasal BID  . potassium chloride  40 mEq Oral BID  . pregabalin  200 mg Oral BID  . sodium chloride  3 mL Intravenous Q12H   Continuous Infusions: . sodium chloride 75 mL/hr at 01/11/13 [phone]  Time spent on care of this patient: 25 minutes   [facility]  [facility] Office  [phone] Pager - Text Page per Loretha Stapler as per below:  On-Call/Text Page:      [email or web address]      password [email or web address]  If 7PM-7AM, please contact night-coverage [email or web address] Password [email or web address] 01/11/2013, 7:17 PM   LOS: 2 days

## 2013-01-11 NOTE — Progress Notes (Signed)
Report given VS obtained and 22:00 medications administered. CBG 254 Novolog 3units administered in addition to her scheduled Lantus Insulin. Pt awake and able to follow commands She understands she is being transferred. Transferred to [id number] on monitor in stable condition.

## 2013-01-12 DIAGNOSIS — R4182 Altered mental status, unspecified: Secondary | ICD-10-CM

## 2013-01-12 LAB — URINE CULTURE

## 2013-01-12 LAB — GLUCOSE, CAPILLARY: Glucose-Capillary: 174 mg/dL — ABNORMAL HIGH (ref 70–99)

## 2013-01-12 LAB — BASIC METABOLIC PANEL
CO2: 23 mEq/L (ref 19–32)
Chloride: 108 mEq/L (ref 96–112)
Creatinine, Ser: 0.99 mg/dL (ref 0.50–1.10)
GFR calc Af Amer: 70 mL/min — ABNORMAL LOW (ref >90)
Sodium: 140 mEq/L (ref 135–145)

## 2013-01-12 MED ORDER — INSULIN ASPART 100 UNIT/ML ~~LOC~~ SOLN
4.0000 [IU] | Freq: Three times a day (TID) | SUBCUTANEOUS | Status: DC
  Administered 2013-01-12 – 2013-01-13 (×5): 4 [IU] via SUBCUTANEOUS

## 2013-01-12 MED ORDER — INSULIN GLARGINE 100 UNIT/ML ~~LOC~~ SOLN
20.0000 [IU] | Freq: Every day | SUBCUTANEOUS | Status: DC
  Administered 2013-01-12 – 2013-01-14 (×3): 20 [IU] via SUBCUTANEOUS
  Filled 2013-01-12 (×4): qty 0.2

## 2013-01-12 MED ORDER — HYDRALAZINE HCL 50 MG PO TABS
50.0000 mg | ORAL_TABLET | Freq: Two times a day (BID) | ORAL | Status: DC
  Administered 2013-01-12 – 2013-01-16 (×7): 50 mg via ORAL
  Filled 2013-01-12 (×9): qty 1

## 2013-01-12 MED ORDER — MAGNESIUM SULFATE 40 MG/ML IJ SOLN
2.0000 g | Freq: Once | INTRAMUSCULAR | Status: AC
  Administered 2013-01-12: 2 g via INTRAVENOUS
  Filled 2013-01-12: qty 50

## 2013-01-12 NOTE — Progress Notes (Signed)
[facility] PROGRESS NOTE  Valerie Rodriguez ZOX:[id number] DOB: 04/03/1951 DOA: 01/09/2013 PCP: Nelwyn Salisbury, MD  Assessment/Plan: Principal Problem:   DKA (diabetic ketoacidoses) Active Problems:   DIABETES MELLITUS, TYPE II   HYPERLIPIDEMIA   DEAFNESS   HYPERTENSION   ASTHMA   Stroke   Atherosclerosis of native arteries of the extremities with intermittent claudication   UTI (urinary tract infection)   Hypotension, unspecified    1. DKA (diabetic ketoacidoses): Patient is a known insulin-requiring diabetic, and presented in DKA, altered mental status and generalized abdominal pain, in the setting of possible non-compliance with therapy, and a UTI. Managed with iv fluids, and ivi insulin, per Gluco-stabilizer protocol, with resolution of DKA and closure of anion gap. She has been successfully transitioned to Lantus/SSI, and CBGs have significantly improved. Adjusting as indicated. Patient feels considerably better, and is maintaining oral intake. Mental status is back to baseline, and abdominal pain has resolved. Hemoglobin A1c is significantly elevated at 13.6.  2. HYPOTENSION WITH HISTORY OF HYPERTENSION: Patient was hypotensive on presentation secondary to DKA, dehydration and volume deletion. Pre-admission antihypertensives were held, and she was managed with aggressive iv fluid resuscitation. Hypotension has resolved. Have discontinued iv fluids today. Will continue to observe BP.  3. UTI: Urinalysis revealed a positive sediment, consistent with UTI. Patient was managed with iv Rocephin, now day # 4. Urine culture has grown pan-sensitive Klebsiella Pneumoniae. A 7-day course of antibiotics is planned, to be concluded on 01/15/13.  4. HYPERLIPIDEMIA: Continuee on statin.   5. ASTHMA: Currently stable. 6. Stroke: Stable/Continue Plavix. 7. PVD: Not problematic at this time.    Code Status: Full Code.  Family Communication:  Disposition Plan: To be determined.    Brief  narrative: 62 year old female with past medical history most significant for insulin-dependent diabetes, deaf mutism since birth, asthma, chronic kidney disease, peripheral vascular disease, history of stroke, hypertension, hyperlipidemia and glaucoma comes in with chief complaints of altered mental status, decreased oral intake and generalized belly pain. Patient has been noncompliant with her medication since last 2-3 days. Admitted for DKA.   Consultants:  N/A.   Procedures:  Head CT scan.   CXR/Abdominal X-Ray.   Antibiotics:  Rocephin 01/09/13>>>  HPI/Subjective: Asymptomatic.   Objective: Vital signs in last 24 hours: Temp:  [97.9 F (36.6 C)-98.5 F (36.9 C)] 98.5 F (36.9 C) (03/31 0547) Pulse Rate:  [60-74] 74 (03/31 0547) Resp:  [11-19] 18 (03/31 0547) BP: (103-157)/(41-80) 138/67 mmHg (03/31 0547) SpO2:  [96 %-100 %] 96 % (03/31 0547) Weight change:  Last BM Date: 01/10/13  Intake/Output from previous day: 03/30 0701 - 03/31 0700 In: 2321.3 [P.O.:1180; I.V.:1091.3; IV Piggyback:50] Out: 3125 [Urine:3125]     Physical Exam: General: Comfortable, alert, not short of breath at rest.  HEENT:  Mild clinical pallor, no jaundice, no conjunctival injection or discharge. Hydration is satisfactory.  NECK:  Supple, JVP not seen, no carotid bruits, no palpable lymphadenopathy, no palpable goiter. CHEST:  Clinically clear to auscultation, no wheezes, no crackles. HEART:  Sounds 1 and 2 heard, normal, regular, no murmurs. ABDOMEN:  Moderately obese, soft, non-tender, no palpable organomegaly, no palpable masses, normal bowel sounds. GENITALIA:  Not examined. LOWER EXTREMITIES:  No pitting edema, palpable peripheral pulses. MUSCULOSKELETAL SYSTEM:  Generalized osteoarthritic changes, otherwise, normal. CENTRAL NERVOUS SYSTEM:  No focal neurologic deficit on gross examination.  Lab Results:  Recent Labs  01/10/13 0612 01/11/13 0620  WBC 11.7* 9.4  HGB 12.3 10.3*   HCT 36.6 31.2*  PLT  279 188    Recent Labs  01/10/13 1355 01/11/13 0620  NA 151* 136  K 3.1* 3.2*  CL 117* 102  CO2 23 21  GLUCOSE 167* 247*  BUN 14 14  CREATININE 1.28* 1.37*  CALCIUM 8.3* 7.9*   Recent Results (from the past 240 hour(s))  URINE CULTURE     Status: None   Collection Time    01/09/13 10:42 PM      Result Value Range Status   Specimen Description URINE, CATHETERIZED   Final   Special Requests NONE   Final   Culture  Setup Time 01/10/2013 03:34   Final   Colony Count >=100,000 COLONIES/ML   Final   Culture KLEBSIELLA PNEUMONIAE   Final   Report Status 01/12/2013 FINAL   Final   Organism ID, Bacteria KLEBSIELLA PNEUMONIAE   Final  MRSA PCR SCREENING     Status: Abnormal   Collection Time    01/10/13  2:15 AM      Result Value Range Status   MRSA by PCR POSITIVE (*) NEGATIVE Final   Comment:            The GeneXpert MRSA Assay (FDA     approved for NASAL specimens     only), is one component of a     comprehensive MRSA colonization     surveillance program. It is not     intended to diagnose MRSA     infection nor to guide or     monitor treatment for     MRSA infections.     RESULT CALLED TO, READ BACK BY AND VERIFIED WITH:     Sheliah Plane [id number] WILDERK     Studies/Results: No results found.  Medications: Scheduled Meds: . atenolol  50 mg Oral Daily  . atorvastatin  40 mg Oral q1800  . budesonide-formoterol  2 puff Inhalation BID  . cefTRIAXone (ROCEPHIN)  IV  1 g Intravenous Q24H  . Chlorhexidine Gluconate Cloth  6 each Topical Q0600  . citalopram  20 mg Oral Daily  . clopidogrel  75 mg Oral Q breakfast  . docusate sodium  100 mg Oral BID  . enoxaparin (LOVENOX) injection  30 mg Subcutaneous Q24H  . feeding supplement  237 mL Oral Q24H  . insulin aspart  0-15 Units Subcutaneous TID WC  . insulin aspart  0-5 Units Subcutaneous QHS  . insulin glargine  15 Units Subcutaneous QHS  . latanoprost  1 drop Both Eyes QHS  . multivitamin  with minerals  1 tablet Oral Daily  . mupirocin ointment  1 application Nasal BID  . potassium chloride  40 mEq Oral BID  . pregabalin  200 mg Oral BID  . sodium chloride  3 mL Intravenous Q12H   Continuous Infusions: . sodium chloride 75 mL/hr at 01/11/13 0855   PRN Meds:.albuterol, dextrose, ipratropium, morphine injection, ondansetron (ZOFRAN) IV, ondansetron, polyethylene glycol    LOS: 3 days   Jep Dyas,CHRISTOPHER  [facility] Pager [phone]. If 8PM-8AM, please contact night-coverage at [email or web address], password [facility] 01/12/2013, 7:17 AM  LOS: 3 days

## 2013-01-12 NOTE — Progress Notes (Addendum)
Inpatient Diabetes Program Recommendations  AACE/ADA: New Consensus Statement on Inpatient Glycemic Control (2013)  Target Ranges:  Prepandial:   less than 140 mg/dL      Peak postprandial:   less than 180 mg/dL (1-2 hours)      Critically ill patients:  140 - 180 mg/dL    Received referral for this patient.  Patient admitted with DKA.  Per patient's daughter, patient noncompliant with all medications for 2-3 days before admission.  Patient is deaf and mute and needs interpreter to communicate.  Need to speak with patient about her home diabetes regimen and how to prevent another occurrence of DKA.  Daughter of patient (daughter interprets) not able to be here today.  Patient may be discharged home tomorrow.  Have scheduled an interpreter for sign language for tomorrow (04/01) at 9am.  Will alert RN caring for patient today to this.  Called patient's endocrinologist today to confirm patient's home insulin regimen (Dr. Talmage Nap with [facility]).  Patient takes the following insulin at home: Lantus 8 units QHS Novolog 15 units tid with meals   Will follow. Ambrose Finland RN, MSN, CDE Diabetes Coordinator Inpatient Diabetes Program [phone]

## 2013-01-12 NOTE — Clinical Social Work Psychosocial (Signed)
Clinical Social Work Department BRIEF PSYCHOSOCIAL ASSESSMENT 01/12/2013  Patient:  Valerie Rodriguez, Valerie Rodriguez     Account Number:  [id number]     Admit date:  01/09/2013  Clinical Social Worker:  Delmer Islam  Date/Time:  01/12/2013 05:20 AM  Referred by:  Physician  Date Referred:  01/12/2013 Referred for  SNF Placement   Other Referral:   Interview type:  Family Other interview type:   CSW talked with daughter Sallyanne Havers [phone]    PSYCHOSOCIAL DATA Living Status:  FAMILY Admitted from facility:   Level of care:   Primary support name:  Sallyanne Havers Primary support relationship to patient:  CHILD, ADULT Degree of support available:   Patient also has a daughter Gerald Stabs. Both daughters concerned about patient's well-being.    CURRENT CONCERNS Current Concerns  Post-Acute Placement   Other Concerns:   SNF vs ALF placement    SOCIAL WORK ASSESSMENT / PLAN CSW talked with Ms. Vedia Coffer about d/c plans and she advised that she had begun to look for LTC placement for her mother in an ALF. She has also applied for Medicaid and gotten a PASARR number.CSW talked with daughterabout the out-of-pocket expenses for ALF or the need for Medicaid. CSW also talked with daughter about ST rehab and that patient can then transition to LTC at ALF or SNF once Medicaid approved. Ms. Vedia Coffer requested time to talk with her sister and call CSW back.    CSW rec'd call from Ms. Black and she and sister have decided to have mother go to SNF for ST rehab and then transition to an ALF or to sister Janet's home. CSW explained the process and informed Ms. Black that a SNF list will be left in patient's room.   Assessment/plan status:  Psychosocial Support/Ongoing Assessment of Needs Other assessment/ plan:   Information/referral to community resources:   SNF list for [facility] left in patient's room    PATIENT'S/FAMILY'S RESPONSE TO PLAN OF CARE: Ms. Vedia Coffer was very engaged with CSW and  appreciative of assistance and information provided.

## 2013-01-12 NOTE — Progress Notes (Signed)
Patient daughter came up to floor with certain requests:   1. Call her when SW comes onto floor, phone number is on sticky note. Daughter [name] says other sister Marylu Lund would be willing to take mother home instead of putting her in a SNF.   2. Patient needs an interpreter for sign language when communicating with Diabetes consultant   3. Family member would like her to start getting up OOB with PT.   Avory Mimbs, RN

## 2013-01-12 NOTE — Evaluation (Signed)
Physical Therapy Evaluation Patient Details Name: Valerie Rodriguez MRN: [id number] DOB: 1951/02/03 Today's Date: 01/12/2013 Time: 1450-1511 PT Time Calculation (min): 21 min  PT Assessment / Plan / Recommendation Clinical Impression  62 yo female who has been deaf since birth was admitted with DKA and UTI She had difficulty with mobility and standing today and was unable to walk with walker.  Will plan to come by in the morning when interpreter is here and progress ambulation.  Anticipate pt will need 24 hour care and followup PT at discharge    PT Assessment  Patient needs continued PT services    Follow Up Recommendations  SNF;Home health PT;Supervision/Assistance - 24 hour    Does the patient have the potential to tolerate intense rehabilitation      Barriers to Discharge        Equipment Recommendations  Rolling walker with 5" wheels    Recommendations for Other Services     Frequency Min 3X/week    Precautions / Restrictions Precautions Precautions: Fall   Pertinent Vitals/Pain No indication of pain      Mobility  Bed Mobility Bed Mobility: Rolling Right;Rolling Left;Left Sidelying to Sit;Sitting - Scoot to [place] of Bed Rolling Right: 4: Min assist;With rail Rolling Left: 4: Min assist;With rail Left Sidelying to Sit: 3: Mod assist Sitting - Scoot to Edge of Bed: 3: Mod assist Details for Bed Mobility Assistance: pt needs assist to scoot on hospital mattress Transfers Transfers: Sit to Stand;Stand to Sit Sit to Stand: 3: Mod assist;From bed Stand to Sit: 3: Mod assist Details for Transfer Assistance: pt slow to respond to gesture to come up to stand with RW.  She needed assist to raise hips off bed  She was only able to stand a few minutes Ambulation/Gait Ambulation/Gait Assistance: Not tested (comment) Assistive device: Rolling walker    Exercises     PT Diagnosis: Difficulty walking;Generalized weakness  PT Problem List: Decreased strength;Decreased  activity tolerance;Decreased balance;Decreased mobility PT Treatment Interventions: DME instruction;Gait training;Functional mobility training;Therapeutic activities;Therapeutic exercise;Balance training;Patient/family education   PT Goals Acute Rehab PT Goals PT Goal Formulation: Patient unable to participate in goal setting Time For Goal Achievement: 01/26/13 Potential to Achieve Goals: Good Pt will go Supine/Side to Sit: with supervision PT Goal: Supine/Side to Sit - Progress: Goal set today Pt will Sit at Edge of Bed: with supervision PT Goal: Sit at Edge Of Bed - Progress: Goal set today Pt will go Sit to Supine/Side: with supervision PT Goal: Sit to Supine/Side - Progress: Goal set today Pt will go Sit to Stand: with supervision PT Goal: Sit to Stand - Progress: Goal set today Pt will go Stand to Sit: with supervision PT Goal: Stand to Sit - Progress: Goal set today Pt will Ambulate: 16 - 50 feet;with min assist;with least restrictive assistive device PT Goal: Ambulate - Progress: Goal set today  Visit Information  Last PT Received On: 01/12/13 Assistance Needed: +2    Subjective Data  Subjective: Pt engages with eye contact and gestures Patient Stated Goal: none stated   Prior Functioning  Home Living Additional Comments: unable to assess Prior Function Comments: unable to assess Communication Communication: Deaf    Cognition  Cognition Overall Cognitive Status: Difficult to assess Difficult to assess due to: Hard of hearing/deaf Arousal/Alertness: Awake/alert Behavior During Session: [facility] for tasks performed Cognition - Other Comments: pt able to follow gestural commands    Extremity/Trunk Assessment Right Lower Extremity Assessment RLE ROM/Strength/Tone: Within functional levels;WFL for tasks  assessed Left Lower Extremity Assessment LLE ROM/Strength/Tone: Deficits LLE ROM/Strength/Tone Deficits: pt is able to move extremtiy against gravity,but is slow to  respond.  She indicates it does not feel right and may have weakness   Balance Balance Balance Assessed: Yes Static Sitting Balance Static Sitting - Balance Support: Bilateral upper extremity supported Static Sitting - Level of Assistance: 5: Stand by assistance Static Standing Balance Static Standing - Balance Support: Bilateral upper extremity supported Static Standing - Level of Assistance: 3: Mod assist Static Standing - Comment/# of Minutes: only able to stand a few seconds  End of Session PT - End of Session Equipment Utilized During Treatment: Gait belt Activity Tolerance: Patient limited by fatigue Patient left: in bed  GP    Rosey Bath K. [place], [place] [phone] 01/12/2013, 4:18 PM

## 2013-01-12 NOTE — Clinical Social Work Placement (Addendum)
Clinical Social Work Department CLINICAL SOCIAL WORK PLACEMENT NOTE 01/12/2013  Patient:  Valerie Rodriguez, Valerie Rodriguez  Account Number:  [id number] Admit date:  01/09/2013  Clinical Social Worker:  Genelle Bal, LCSW  Date/time:  01/12/2013 05:31 AM  Clinical Social Work is seeking post-discharge placement for this patient at the following level of care:   SKILLED NURSING   (*CSW will update this form in Epic as items are completed)   01/12/2013  Patient/family provided with [facility] Department of Clinical Social Works list of facilities offering this level of care within the geographic area requested by the patient (or if unable, by the patients family).  01/12/2013  Patient/family informed of their freedom to choose among providers that offer the needed level of care, that participate in Medicare, Medicaid or managed care program needed by the patient, have an available bed and are willing to accept the patient.    Patient/family informed of [facility] ownership interest in [facility], as well as of the fact that they are under no obligation to receive care at this facility.  PASARR submitted to [employer] on 01/12/2013 PASARR number received from [employer] on   FL2 transmitted to all facilities in geographic area requested by pt/family on  01/12/2013 FL2 transmitted to all facilities within larger geographic area on   Patient informed that his/her managed care company has contracts with or will negotiate with  certain facilities, including the following:     Patient/family informed of bed offers received:  01/13/2013 Patient chooses bed at [facility] Physician recommends and patient chooses bed at    Patient to be transferred to   on   Patient to be transferred to facility by   The following physician request were entered in Epic:   Additional Comments: 01/12/13 - Pt had an "A" PASARR number that ended when "K" PASARR number given.

## 2013-01-12 NOTE — Discharge Summary (Signed)
Physician Discharge Summary  Valerie Rodriguez WUJ:[id number] DOB: May 27, 1951 DOA: 01/09/2013  PCP: Nelwyn Salisbury, MD  Admit date: 01/09/2013 Discharge date: 01/13/2013  Time spent: 40 minutes  Recommendations for Outpatient Follow-up:  1. Follow up with PMD.   Discharge Diagnoses:  Principal Problem:   DKA (diabetic ketoacidoses) Active Problems:   DIABETES MELLITUS, TYPE II   HYPERLIPIDEMIA   DEAFNESS   HYPERTENSION   ASTHMA   Stroke   Atherosclerosis of native arteries of the extremities with intermittent claudication   UTI (urinary tract infection)   Hypotension, unspecified   Discharge Condition: Satisfactory.  Diet recommendation: Heart-Healthy/Carbohydrate-Modified.   Filed Weights   01/10/13 0500 01/11/13 0500  Weight: 45.36 kg (100 lb) 43.2 kg (95 lb 3.8 oz)    History of present illness:  62 year old female with past medical history most significant for insulin-dependent diabetes, deaf mutism since birth, asthma, chronic kidney disease, peripheral vascular disease, history of stroke, hypertension, hyperlipidemia and glaucoma comes in with chief complaints of altered mental status, decreased oral intake and generalized belly pain. Patient has been noncompliant with her medication since last 2-3 days. Admitted for DKA.   Hospital Course:  1. DKA (diabetic ketoacidoses): Patient is a known insulin-requiring diabetic, and presented in DKA, with altered mental status and generalized abdominal pain, in the setting of possible non-compliance with therapy, and a UTI. Managed with iv fluids, and ivi insulin per Gluco-stabilizer protocol, with resolution of DKA and closure of anion gap. She was successfully transitioned to Lantus/SSI, and CBGs have markedly improved, with adjustment of insulin regimen. Mental status is back to baseline, and abdominal pain has resolved. Hemoglobin A1c is significantly elevated at 13.6.  2. HYPOTENSION/HISTORY OF HYPERTENSION: Patient was hypotensive  on presentation, secondary to DKA, dehydration and volume deletion. Pre-admission antihypertensives were held, and she was managed with aggressive iv fluid resuscitation, with normalization of BP. Iv fluids were discontinued on 01/12/13. As BP started creeping up afterwards, she was reinstated on pre-admisson anti-hypertensives, without deleterious effect.  3. UTI: Urinalysis revealed a positive sediment, consistent with UTI. Patient was managed with iv Rocephin, and urine culture grew pan-sensitive Klebsiella Pneumoniae. Transitioned to oral Kefex on 01/13/13. A total of 7-days of antibiotics is planned, to be concluded on 01/15/13.  4. HYPERLIPIDEMIA: Continued on statin.  5. ASTHMA: Currently stable.  6. Stroke: Stable/Continued on Plavix.  7. PVD: Not problematic at this time.  8. Bruise dorsum of left foot: Patient on 01/13/13, complained of some discomfort and mild pain on the dorsum of left foot. Examination revealed a small subcutaneous hematoma, without evidence of infection. Patient admits to recent fall or trauma. Reassured. No active treatment indicated.       Procedures:  See Below.   Consultations:  N/A.   Discharge Exam: Filed Vitals:   01/13/13 0524 01/13/13 0900 01/13/13 1100 01/13/13 1500  BP: 132/64  161/89 148/84  Pulse: 68  67 68  Temp: 98.1 F (36.7 C)  98.7 F (37.1 C) 99.2 F (37.3 C)  TempSrc: Oral  Oral Oral  Resp: 18  18 16   Height:      Weight:      SpO2: 97% 98% 96% 98%    General: Comfortable, alert, not short of breath at rest.  HEENT: Mild clinical pallor, no jaundice, no conjunctival injection or discharge. Hydration is satisfactory.  NECK: Supple, JVP not seen, no carotid bruits, no palpable lymphadenopathy, no palpable goiter.  CHEST: Clinically clear to auscultation, no wheezes, no crackles.  HEART: Sounds 1 and  2 heard, normal, regular, no murmurs.  ABDOMEN: Moderately obese, soft, non-tender, no palpable organomegaly, no palpable masses, normal  bowel sounds.  GENITALIA: Not examined.  LOWER EXTREMITIES: No pitting edema, palpable peripheral pulses.  MUSCULOSKELETAL SYSTEM: Generalized osteoarthritic changes, otherwise, normal.  CENTRAL NERVOUS SYSTEM: No focal neurologic deficit on gross examination.  Discharge Instructions     Medication List    ASK your doctor about these medications       albuterol 108 (90 BASE) MCG/ACT inhaler  Commonly known as:  PROVENTIL HFA;VENTOLIN HFA  Inhale 2 puffs into the lungs every 4 (four) hours as needed for wheezing or shortness of breath.     albuterol-ipratropium 18-103 MCG/ACT inhaler  Commonly known as:  COMBIVENT  Inhale 2 puffs into the lungs 4 (four) times daily.     atenolol 50 MG tablet  Commonly known as:  TENORMIN  Take 50 mg by mouth daily.     azelastine 137 MCG/SPRAY nasal spray  Commonly known as:  ASTELIN  Place 1 spray into the nose 2 (two) times daily. Use in each nostril as directed     budesonide-formoterol 160-4.5 MCG/ACT inhaler  Commonly known as:  SYMBICORT  Inhale 2 puffs into the lungs 2 (two) times daily.     citalopram 20 MG tablet  Commonly known as:  CELEXA  Take 20 mg by mouth daily.     clopidogrel 75 MG tablet  Commonly known as:  PLAVIX  Take 75 mg by mouth daily.     fluticasone 50 MCG/ACT nasal spray  Commonly known as:  FLONASE  Place 2 sprays into the nose daily.     glucose blood test strip  1 each by Other route 4 (four) times daily. Use as instructed     BAYER BREEZE 2 TEST Disk  Generic drug:  Glucose Blood  2 (two) times daily.     hydrALAZINE 50 MG tablet  Commonly known as:  APRESOLINE  Take 50 mg by mouth 2 (two) times daily.     insulin aspart 100 UNIT/ML injection  Commonly known as:  novoLOG  Inject 15 Units into the skin 3 (three) times daily before meals.     insulin glargine 100 UNIT/ML injection  Commonly known as:  LANTUS  Inject 8 Units into the skin at bedtime.     latanoprost 0.005 % ophthalmic  solution  Commonly known as:  XALATAN  Place 1 drop into both eyes at bedtime.     montelukast 10 MG tablet  Commonly known as:  SINGULAIR  Take 1 tablet (10 mg total) by mouth at bedtime.     pantoprazole 40 MG tablet  Commonly known as:  PROTONIX  Take 1 tablet (40 mg total) by mouth daily.     pregabalin 200 MG capsule  Commonly known as:  LYRICA  Take 1 capsule (200 mg total) by mouth 2 (two) times daily.     promethazine 25 MG tablet  Commonly known as:  PHENERGAN  Take 25 mg by mouth every 4 (four) hours as needed for nausea.     rosuvastatin 40 MG tablet  Commonly known as:  CRESTOR  Take 1 tablet (40 mg total) by mouth daily.          The results of significant diagnostics from this hospitalization (including imaging, microbiology, ancillary and laboratory) are listed below for reference.    Significant Diagnostic Studies: Ct Head (brain) Wo Contrast  01/09/2013  *RADIOLOGY REPORT*  Clinical Data: Altered mental status, lethargic,  not following commands, deaf patient not signing today  CT HEAD WITHOUT CONTRAST  Technique:  Contiguous axial images were obtained from the base of the skull through the vertex without contrast.  Comparison: 11/05/2010  Findings: Generalized atrophy. Stable mild ex vacuo dilatation of the ventricular system. No midline shift or mass effect. Small vessel chronic ischemic changes of deep cerebral white matter. Old lacunar infarcts at left caudate head and left thalamus. No intracranial hemorrhage, mass lesion, or evidence of acute infarction. No extra-axial fluid collections. Bones and sinuses unremarkable.  IMPRESSION: Atrophy with small vessel chronic ischemic changes of deep cerebral white matter. Old lacunar infarcts left caudate head and left thalamus. No acute intracranial abnormalities.   Original Report Authenticated By: Ulyses Southward, M.D.    Dg Abd Acute W/chest  01/10/2013  *RADIOLOGY REPORT*  Clinical Data: Evaluate for bowel obstruction  or pneumonia.  ACUTE ABDOMEN SERIES (ABDOMEN 2 VIEW & CHEST 1 VIEW)  Comparison: Abdominal radiograph 11/08/2010.  Chest x-ray 11/09/2010.  Findings: Lung volumes are slightly low.  No definite consolidative airspace disease.  No pleural effusions.  No evidence of pulmonary edema.  Heart size is normal. The patient is rotated to the left on today's exam, resulting in distortion of the mediastinal contours and reduced diagnostic sensitivity and specificity for mediastinal pathology.  Atherosclerosis in the thoracic aorta.  Supine and left lateral decubitus views of the abdomen demonstrate gas and stool scattered throughout the colon extending to the level of the distal rectum.  There are multiple nondilated gas filled loops of small bowel throughout the central abdomen which are conspicuous, but are nonspecific.  No pneumoperitoneum.  Surgical clips project over the right upper quadrant of the abdomen, likely from prior cholecystectomy.  IMPRESSION: 1.  Nonspecific, nonobstructive bowel gas pattern, as above. 2.  Surgical clips in the right upper quadrant of the abdomen, likely from prior cholecystectomy. 3.  Low lung volumes without radiographic evidence of acute cardiopulmonary disease. 4.  Atherosclerosis.   Original Report Authenticated By: Trudie Reed, M.D.     Microbiology: Recent Results (from the past 240 hour(s))  URINE CULTURE     Status: None   Collection Time    01/09/13 10:42 PM      Result Value Range Status   Specimen Description URINE, CATHETERIZED   Final   Special Requests NONE   Final   Culture  Setup Time 01/10/2013 03:34   Final   Colony Count >=100,000 COLONIES/ML   Final   Culture KLEBSIELLA PNEUMONIAE   Final   Report Status 01/12/2013 FINAL   Final   Organism ID, Bacteria KLEBSIELLA PNEUMONIAE   Final  MRSA PCR SCREENING     Status: Abnormal   Collection Time    01/10/13  2:15 AM      Result Value Range Status   MRSA by PCR POSITIVE (*) NEGATIVE Final   Comment:             The GeneXpert MRSA Assay (FDA     approved for NASAL specimens     only), is one component of a     comprehensive MRSA colonization     surveillance program. It is not     intended to diagnose MRSA     infection nor to guide or     monitor treatment for     MRSA infections.     RESULT CALLED TO, READ BACK BY AND VERIFIED WITH:     Sheliah Plane [id number] [facility]  Labs: Basic Metabolic Panel:  Recent Labs Lab 01/10/13 0612 01/10/13 1355 01/11/13 0620 01/11/13 0824 01/12/13 0550 01/13/13 0505  NA 156* 151* 136  --  140 141  K 3.1* 3.1* 3.2*  --  4.2 4.7  CL 119* 117* 102  --  108 108  CO2 24 23 21   --  23 24  GLUCOSE 188* 167* 247*  --  178* 157*  BUN 17 14 14   --  13 12  CREATININE 1.27* 1.28* 1.37*  --  0.99 0.97  CALCIUM 8.7 8.3* 7.9*  --  7.8* 8.5  MG  --   --   --  1.5  --  2.0   Liver Function Tests:  Recent Labs Lab 01/09/13 2209  AST 13  ALT 9  ALKPHOS 96  BILITOT 0.4  PROT 7.6  ALBUMIN 3.2*   No results found for this basename: LIPASE, AMYLASE,  in the last 168 hours No results found for this basename: AMMONIA,  in the last 168 hours CBC:  Recent Labs Lab 01/09/13 2209 01/10/13 0612 01/11/13 0620 01/13/13 0505  WBC 8.0 11.7* 9.4 7.6  NEUTROABS 6.7 8.5*  --   --   HGB 12.7 12.3 10.3* 10.4*  HCT 38.4 36.6 31.2* 30.9*  MCV 74.9* 73.8* 74.6* 72.4*  PLT 342 279 188 182   Cardiac Enzymes:  Recent Labs Lab 01/09/13 2212 01/10/13 0257 01/10/13 0853 01/10/13 1355  TROPONINI <0.30 <0.30 <0.30 <0.30   BNP: BNP (last 3 results) No results found for this basename: PROBNP,  in the last 8760 hours CBG:  Recent Labs Lab 01/12/13 1634 01/12/13 2137 01/13/13 0756 01/13/13 1155 01/13/13 1640  GLUCAP 265* 81 192* 204* 180*       Signed:  Leanore Biggers,CHRISTOPHER  [facility] 01/13/2013, 5:41 PM

## 2013-01-13 LAB — CBC
HCT: 30.9 % — ABNORMAL LOW (ref 36.0–46.0)
Hemoglobin: 10.4 g/dL — ABNORMAL LOW (ref 12.0–15.0)
MCHC: 33.7 g/dL (ref 30.0–36.0)
MCV: 72.4 fL — ABNORMAL LOW (ref 78.0–100.0)
RDW: 16.1 % — ABNORMAL HIGH (ref 11.5–15.5)

## 2013-01-13 LAB — BASIC METABOLIC PANEL
BUN: 12 mg/dL (ref 6–23)
Chloride: 108 mEq/L (ref 96–112)
Creatinine, Ser: 0.97 mg/dL (ref 0.50–1.10)
GFR calc Af Amer: 72 mL/min — ABNORMAL LOW (ref >90)
GFR calc non Af Amer: 62 mL/min — ABNORMAL LOW (ref >90)
Glucose, Bld: 157 mg/dL — ABNORMAL HIGH (ref 70–99)

## 2013-01-13 LAB — GLUCOSE, CAPILLARY
Glucose-Capillary: 204 mg/dL — ABNORMAL HIGH (ref 70–99)
Glucose-Capillary: 109 mg/dL — ABNORMAL HIGH (ref 70–99)

## 2013-01-13 MED ORDER — CEPHALEXIN 500 MG PO CAPS
500.0000 mg | ORAL_CAPSULE | Freq: Two times a day (BID) | ORAL | Status: DC
  Administered 2013-01-13 – 2013-01-16 (×5): 500 mg via ORAL
  Filled 2013-01-13 (×7): qty 1

## 2013-01-13 MED ORDER — CHLORHEXIDINE GLUCONATE CLOTH 2 % EX PADS
6.0000 | MEDICATED_PAD | Freq: Every day | CUTANEOUS | Status: AC
  Administered 2013-01-13 – 2013-01-14 (×2): 6 via TOPICAL

## 2013-01-13 MED ORDER — INSULIN ASPART 100 UNIT/ML ~~LOC~~ SOLN
4.0000 [IU] | Freq: Three times a day (TID) | SUBCUTANEOUS | Status: DC
  Administered 2013-01-13 – 2013-01-16 (×7): 4 [IU] via SUBCUTANEOUS

## 2013-01-13 NOTE — Progress Notes (Signed)
Inpatient Diabetes Program Recommendations  AACE/ADA: New Consensus Statement on Inpatient Glycemic Control (2013)  Target Ranges:  Prepandial:   less than 140 mg/dL      Peak postprandial:   less than 180 mg/dL (1-2 hours)      Critically ill patients:  140 - 180 mg/dL    Patient admitted with DKA.  Glucose 800 mg/dl on admission.    Verified home insulin regimen with patient's endocrinologist Dr. Talmage Nap.    Patient takes the following insulin at home: Lantus 8 units QHS Novolog 15 units tid with meals  Spoke with patient today regarding her diabetes regimen at home using sign language interpreter.  Discussed A1c results with her and explained that her goal A1c is 7% or less (glucose levels of 150 mg/dl).  Discussed the dangers of hyperglycemia and the importance of good glucose control to prevent long-term complications.  Also discussed the importance of checking CBGs at least tid at home.  Discussed the two different types of insulin at home that patient takes and reminded patient that she needs to take her insulin even when she doesn't feel well.  Explained the treatment that patient received here in hospital for DKA and also reviewed the signs and symptoms of hypoglycemia and how to treat at home.  It appears patient's daughter assists patient with her medications at home.  Noted patient to be d/c'd to SNF for ST rehab.  Patient had no questions for me at this time.  Have asked RNs to speak with patient's daughter and see if patient's daughter has any questions about her diabetes regimen at home.   Will follow. Ambrose Finland RN, MSN, CDE Diabetes Coordinator Inpatient Diabetes Program [phone]

## 2013-01-13 NOTE — Progress Notes (Signed)
[facility] PROGRESS NOTE  [name] NWG:[id number] DOB: 1951/08/22 DOA: 01/09/2013 PCP: Nelwyn Salisbury, MD  Assessment/Plan: Principal Problem:   DKA (diabetic ketoacidoses) Active Problems:   DIABETES MELLITUS, TYPE II   HYPERLIPIDEMIA   DEAFNESS   HYPERTENSION   ASTHMA   Stroke   Atherosclerosis of native arteries of the extremities with intermittent claudication   UTI (urinary tract infection)   Hypotension, unspecified    1. DKA (diabetic ketoacidoses): Patient is a known insulin-requiring diabetic, and presented in DKA, altered mental status and generalized abdominal pain, in the setting of possible non-compliance with therapy, and a UTI. Managed with iv fluids, and ivi insulin, per Gluco-stabilizer protocol, with resolution of DKA and closure of anion gap. She has been successfully transitioned to Lantus/SSI, and CBGs have significantly improved. Adjusting as indicated. Patient feels considerably better, and is maintaining oral intake. Mental status is back to baseline, and abdominal pain has resolved. Hemoglobin A1c is significantly elevated at 13.6. Have added meal cover today, to optimize control.  2. HYPOTENSION WITH HISTORY OF HYPERTENSION: Patient was hypotensive on presentation secondary to DKA, dehydration and volume deletion. Pre-admission antihypertensives were held, and she was managed with aggressive iv fluid resuscitation. Hypotension has resolved. Have discontinued iv fluids on 01/12/13, and BP has remained normal, since. Pre-admission antihypertensives have been restarted without deleterious effect.  3. UTI: Urinalysis revealed a positive sediment, consistent with UTI. Patient was managed with iv Rocephin, now day # 5. Urine culture has grown pan-sensitive Klebsiella Pneumoniae. A 7-day course of antibiotics is planned, to be concluded on 01/15/13.  4. HYPERLIPIDEMIA: Continued on statin.   5. ASTHMA: Currently stable. 6. Stroke: Stable/Continue Plavix. 7. PVD:  Not problematic at this time.  8. Bruise dorsum of left foot: Patient on 01/13/13, complained of some discomfort and mild pain on the dorsum of left foot. Examination revealed a small subcutaneous hematoma, without evidence of infection. Patient admits to recent fall or trauma. Reassured. No active treatment indicated.    Code Status: Full Code.  Family Communication:  Disposition Plan: To be determined.    Brief narrative: 62 year old female with past medical history most significant for insulin-dependent diabetes, deaf mutism since birth, asthma, chronic kidney disease, peripheral vascular disease, history of stroke, hypertension, hyperlipidemia and glaucoma comes in with chief complaints of altered mental status, decreased oral intake and generalized belly pain. Patient has been noncompliant with her medication since last 2-3 days. Admitted for DKA.   Consultants:  N/A.   Procedures:  Head CT scan.   CXR/Abdominal X-Ray.   Antibiotics:  Rocephin 01/09/13>>>  HPI/Subjective: No new issues, other than mild left foot pain.    Objective: Vital signs in last 24 hours: Temp:  [98.1 F (36.7 C)-99.2 F (37.3 C)] 99.2 F (37.3 C) (04/01 1500) Pulse Rate:  [67-72] 68 (04/01 1500) Resp:  [16-18] 16 (04/01 1500) BP: (132-161)/(61-89) 148/84 mmHg (04/01 1500) SpO2:  [96 %-98 %] 98 % (04/01 1500) Weight change:  Last BM Date: 01/10/13  Intake/Output from previous day: 03/31 0701 - 04/01 0700 In: 50 [IV Piggyback:50] Out: 3650 [Urine:3650] Total I/O In: 240 [P.O.:240] Out: 1400 [Urine:1400]   Physical Exam: General: Comfortable, alert, not short of breath at rest.  HEENT:  Mild clinical pallor, no jaundice, no conjunctival injection or discharge. Hydration is satisfactory.  NECK:  Supple, JVP not seen, no carotid bruits, no palpable lymphadenopathy, no palpable goiter. CHEST:  Clinically clear to auscultation, no wheezes, no crackles. HEART:  Sounds 1 and 2 heard, normal,  regular, no murmurs. ABDOMEN:  Moderately obese, soft, non-tender, no palpable organomegaly, no palpable masses, normal bowel sounds. GENITALIA:  Not examined. LOWER EXTREMITIES:  No pitting edema, palpable peripheral pulses. A small subcutaneous hematoma, without evidence of infection, is noted on the dorsum of the left foot.  MUSCULOSKELETAL SYSTEM:  Generalized osteoarthritic changes, otherwise, normal.  CENTRAL NERVOUS SYSTEM:  No focal neurologic deficit on gross examination.  Lab Results:  Recent Labs  01/11/13 0620 01/13/13 0505  WBC 9.4 7.6  HGB 10.3* 10.4*  HCT 31.2* 30.9*  PLT 188 182    Recent Labs  01/12/13 0550 01/13/13 0505  NA 140 141  K 4.2 4.7  CL 108 108  CO2 23 24  GLUCOSE 178* 157*  BUN 13 12  CREATININE 0.99 0.97  CALCIUM 7.8* 8.5   Recent Results (from the past 240 hour(s))  URINE CULTURE     Status: None   Collection Time    01/09/13 10:42 PM      Result Value Range Status   Specimen Description URINE, CATHETERIZED   Final   Special Requests NONE   Final   Culture  Setup Time 01/10/2013 03:34   Final   Colony Count >=100,000 COLONIES/ML   Final   Culture KLEBSIELLA PNEUMONIAE   Final   Report Status 01/12/2013 FINAL   Final   Organism ID, Bacteria KLEBSIELLA PNEUMONIAE   Final  MRSA PCR SCREENING     Status: Abnormal   Collection Time    01/10/13  2:15 AM      Result Value Range Status   MRSA by PCR POSITIVE (*) NEGATIVE Final   Comment:            The GeneXpert MRSA Assay (FDA     approved for NASAL specimens     only), is one component of a     comprehensive MRSA colonization     surveillance program. It is not     intended to diagnose MRSA     infection nor to guide or     monitor treatment for     MRSA infections.     RESULT CALLED TO, READ BACK BY AND VERIFIED WITH:     Sheliah Plane [id number] [facility]     Studies/Results: No results found.  Medications: Scheduled Meds: . atenolol  50 mg Oral Daily  . atorvastatin  40 mg  Oral q1800  . budesonide-formoterol  2 puff Inhalation BID  . cefTRIAXone (ROCEPHIN)  IV  1 g Intravenous Q24H  . Chlorhexidine Gluconate Cloth  6 each Topical Q0600  . citalopram  20 mg Oral Daily  . clopidogrel  75 mg Oral Q breakfast  . docusate sodium  100 mg Oral BID  . enoxaparin (LOVENOX) injection  30 mg Subcutaneous Q24H  . feeding supplement  237 mL Oral Q24H  . hydrALAZINE  50 mg Oral BID  . insulin aspart  0-15 Units Subcutaneous TID WC  . insulin aspart  0-5 Units Subcutaneous QHS  . insulin aspart  4 Units Subcutaneous TID WC  . insulin glargine  20 Units Subcutaneous QHS  . latanoprost  1 drop Both Eyes QHS  . multivitamin with minerals  1 tablet Oral Daily  . mupirocin ointment  1 application Nasal BID  . potassium chloride  40 mEq Oral BID  . pregabalin  200 mg Oral BID  . sodium chloride  3 mL Intravenous Q12H   Continuous Infusions:   PRN Meds:.albuterol, dextrose, ipratropium, morphine injection, ondansetron (ZOFRAN) IV,  ondansetron, polyethylene glycol    LOS: 4 days   Jalyn Dutta,CHRISTOPHER  [facility] Pager [phone]. If 8PM-8AM, please contact night-coverage at [email or web address], password [facility] 01/13/2013, 4:42 PM  LOS: 4 days

## 2013-01-13 NOTE — Progress Notes (Signed)
Physical Therapy Treatment Patient Details Name: Valerie Rodriguez MRN: [id number] DOB: 09-09-1951 Today's Date: 01/13/2013 Time: 4098-1191 PT Time Calculation (min): 27 min  PT Assessment / Plan / Recommendation Comments on Treatment Session  sign language interpreter here during treatment. Pts major complaint is pain in left foot . She is noted to have redness in foot with raised area over top of foot that is extremely painful. Pt not able to tolerate touch and has decreasead movement in left leg.  Pt agrees she will need more rehab after discharge.    Follow Up Recommendations  SNF;Supervision/Assistance - 24 hour     Does the patient have the potential to tolerate intense rehabilitation     Barriers to Discharge        Equipment Recommendations  None recommended by PT    Recommendations for Other Services    Frequency Min 3X/week   Plan Discharge plan remains appropriate;Frequency remains appropriate    Precautions / Restrictions Precautions Precautions: Fall Restrictions Weight Bearing Restrictions: No   Pertinent Vitals/Pain Pt c/o severe pain in left foot.  Redness noted in foot    Mobility  Bed Mobility Bed Mobility: Rolling Right;Rolling Left;Left Sidelying to Sit;Sitting - Scoot to Edge of Bed Rolling Right: With rail;6: Modified independent (Device/Increase time) Rolling Left: 4: Min assist;With rail Left Sidelying to Sit: 4: Min assist;With rails Sitting - Scoot to [place] of Bed: 3: Mod assist;4: Min assist Details for Bed Mobility Assistance: pt needs assist to scoot on hospital mattress pt moves slowly and indicates pain in left leg  Transfers Transfers: Sit to Stand;Stand to Sit Sit to Stand: 3: Mod assist;From bed Stand to Sit: 3: Mod assist Details for Transfer Assistance: pt slow to respond to gesture to come up to stand with RW.  She needed assist to raise hips off bed  She was only able to stand a few minutes Ambulation/Gait Ambulation/Gait Assistance: Not  tested (comment);3: Mod assist Ambulation Distance (Feet): 3 Feet Assistive device: Rolling walker Ambulation/Gait Assistance Details: assistance with multimodal cues at sternum and pelvis to stand erect Gait Pattern: Step-to pattern;Decreased step length - right;Decreased step length - left Gait velocity: decreased General Gait Details: Pt moves very slowly.  She keeps head up with eyes on interpreter ,but is unable to acitvate glutes or trunk extensors to stand erect. Stairs: No Wheelchair Mobility Wheelchair Mobility: No    Exercises General Exercises - Lower Extremity Ankle Circles/Pumps: AROM;Right;5 reps;Limitations Ankle Circles/Pumps Limitations: Pt is unable to doe dorsi/plantarflexion with left foot Short Arc Quad: AROM;Left;10 reps;Right;5 reps;Limitations Short Arc Quad Limitations: more difficulty with all activity on left side   PT Diagnosis:    PT Problem List:   PT Treatment Interventions:     PT Goals Acute Rehab PT Goals PT Goal Formulation: With patient (via sign language interpreter) Time For Goal Achievement: 01/26/13 Potential to Achieve Goals: Good Pt will go Supine/Side to Sit: with supervision PT Goal: Supine/Side to Sit - Progress: Progressing toward goal Pt will Sit at Edge of Bed: with supervision PT Goal: Sit at Edge Of Bed - Progress: Progressing toward goal Pt will go Sit to Supine/Side: with supervision Pt will go Sit to Stand: with supervision PT Goal: Sit to Stand - Progress: Progressing toward goal Pt will go Stand to Sit: with supervision PT Goal: Stand to Sit - Progress: Progressing toward goal Pt will Ambulate: 16 - 50 feet;with min assist;with least restrictive assistive device PT Goal: Ambulate - Progress: Progressing toward goal  Visit  Information  Last PT Received On: 01/13/13    Subjective Data  Subjective: Sign language interpreter here.  Pt c/o being "weak, weak, weak" and painful  in her feet, especially her left foot Patient  Stated Goal: to have less pain   Cognition  Cognition Overall Cognitive Status: Difficult to assess Difficult to assess due to: Hard of hearing/deaf Arousal/Alertness: Awake/alert Behavior During Session: [facility] for tasks performed Cognition - Other Comments: pt able to follow gestural commands    Balance  Static Sitting Balance Static Sitting - Balance Support: No upper extremity supported Static Sitting - Level of Assistance: 6: Modified independent (Device/Increase time) Static Sitting - Comment/# of Minutes: worked on trunk extension in sitting.  Pt c/o dizziness upon standing Static Standing Balance Static Standing - Balance Support: Bilateral upper extremity supported Static Standing - Level of Assistance: 3: Mod assist Static Standing - Comment/# of Minutes: pt able to stand for about 30 seconds, but did not stand erect  End of Session PT - End of Session Equipment Utilized During Treatment: Gait belt Activity Tolerance: Patient limited by pain Patient left: in chair;Other (comment) (interpreter and diabetes nurse in room) Nurse Communication: Other (comment) (pain and discoloration in left foot)   GP   Bayard Hugger. Manson Passey, [place] [phone] 01/13/2013, 10:02 AM

## 2013-01-14 ENCOUNTER — Inpatient Hospital Stay (HOSPITAL_COMMUNITY): Payer: Medicare Other

## 2013-01-14 DIAGNOSIS — E1110 Type 2 diabetes mellitus with ketoacidosis without coma: Secondary | ICD-10-CM

## 2013-01-14 DIAGNOSIS — I6350 Cerebral infarction due to unspecified occlusion or stenosis of unspecified cerebral artery: Secondary | ICD-10-CM

## 2013-01-14 LAB — CBC
Hemoglobin: 10.4 g/dL — ABNORMAL LOW (ref 12.0–15.0)
MCH: 24.5 pg — ABNORMAL LOW (ref 26.0–34.0)
RBC: 4.24 MIL/uL (ref 3.87–5.11)
WBC: 6.1 10*3/uL (ref 4.0–10.5)

## 2013-01-14 LAB — BASIC METABOLIC PANEL
CO2: 28 mEq/L (ref 19–32)
Calcium: 9.1 mg/dL (ref 8.4–10.5)
Chloride: 108 mEq/L (ref 96–112)
Glucose, Bld: 106 mg/dL — ABNORMAL HIGH (ref 70–99)
Sodium: 141 mEq/L (ref 135–145)

## 2013-01-14 LAB — GLUCOSE, CAPILLARY
Glucose-Capillary: 93 mg/dL (ref 70–99)
Glucose-Capillary: 254 mg/dL — ABNORMAL HIGH (ref 70–99)
Glucose-Capillary: 97 mg/dL (ref 70–99)
Glucose-Capillary: 144 mg/dL — ABNORMAL HIGH (ref 70–99)

## 2013-01-14 LAB — LIPID PANEL
Total CHOL/HDL Ratio: 4.1 RATIO
VLDL: 29 mg/dL (ref 0–40)

## 2013-01-14 MED ORDER — INSULIN GLARGINE 100 UNIT/ML ~~LOC~~ SOLN: 20.0000 [IU] | Freq: Every day | SUBCUTANEOUS | Status: AC

## 2013-01-14 MED ORDER — METOPROLOL TARTRATE 1 MG/ML IV SOLN: 5.0000 mg | Freq: Four times a day (QID) | INTRAVENOUS | Status: DC | PRN

## 2013-01-14 MED ORDER — CEPHALEXIN 500 MG PO CAPS: 500.0000 mg | ORAL_CAPSULE | Freq: Three times a day (TID) | ORAL | Status: AC

## 2013-01-14 NOTE — Clinical Social Work Note (Signed)
Patient was initially ready for discharge today to [facility], however CSW later advised that there has been a change in patient's medical status and she will not discharge today. Nursing facility contacted and advised.  Genelle Bal, MSW, LCSW [phone]

## 2013-01-14 NOTE — Progress Notes (Signed)
SLP reviewed and agree with student findings.   Hermie Reagor MA, CCC-SLP [phone]    

## 2013-01-14 NOTE — Progress Notes (Addendum)
*  Preliminary Results*  Vascular Ultrasound Carotid Duplex (Doppler) has been completed.  The right internal carotid artery demonstrates elevated velocities suggestive of stenosis <40%. There is no obvious evidence of hemodynamically significant left carotid artery stenosis. Vertebral arteries are patent with antegrade flow.  01/14/2013 5:45 PM Gertie Fey, RDMS, RDCS

## 2013-01-14 NOTE — Consult Note (Signed)
Referring Physician: Dr. Susie Cassette    Chief Complaint: altered mental status  HPI: Valerie Rodriguez is an 62 y.o. female who was admitted 01/09/2013 with 2 day history of altered mental status. Diagnosed with DKA and UTI. 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 Patient was also noted today to cough with po intake and has been made NPO until speech therapy can evaluate. 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  Date last known well: prior to 01/10/2013 Time last known well: Unable to determine  tPA Given: No: out of window  Reported previous history of CVA and was on plavix prior to admission.  Past Medical History  Diagnosis Date  . Hypertension   . Hyperlipemia   . Deaf-mutism   . Glaucoma(365)     Dr Eulah Pont  . Diabetes mellitus     Type II, sees Dr. Candie Chroman  . Allergy   . Asthma     sees Dr [place] Callas  . Stroke 11-05-10    sees Dr. Pearlean Brownie  . Chronic kidney disease     sees Dr. Camille Bal   . PVD (peripheral vascular disease)     sees Dr. Hart Rochester   . PAD (peripheral artery disease)     Past Surgical History  Procedure Laterality Date  . Cholecystectomy    . Abdominal hysterectomy      oophorectomy 1976  . Rotator cuff repair  2004  . Colonoscopy  03-30-09    per Dr. Marina Goodell, poor prep, repeat one yr     Family History  Problem Relation Age of Onset  . Hypertension      family hx  . Coronary artery disease      female 1st degree relative  . Hyperlipidemia      family hx  . Diabetes      1st degree relative  . Diabetes Mother   . Heart disease Mother   . Hypertension Mother   . Hyperlipidemia Mother    Social History:  reports that she quit smoking about 33 years ago. Her smoking use included Cigarettes. She smoked 0.00 packs per day. She has never used smokeless tobacco. She reports that she  does not drink alcohol or use illicit drugs.  Allergies: No Known Allergies  Current Facility-Administered Medications  Medication Dose Route Frequency Provider Last Rate Last Dose  . albuterol (PROVENTIL) (5 MG/ML) 0.5% nebulizer solution 2.5 mg  2.5 mg Nebulization Q6H PRN Calvert Cantor, MD      . atenolol (TENORMIN) tablet 50 mg  50 mg Oral Daily Calvert Cantor, MD   50 mg at 01/14/13 1053  . atorvastatin (LIPITOR) tablet 40 mg  40 mg Oral q1800 Lars Mage, MD   40 mg at 01/13/13 1815  . budesonide-formoterol (SYMBICORT) 160-4.5 MCG/ACT inhaler 2 puff  2 puff Inhalation BID Lars Mage, MD   2 puff at 01/14/13 0801  . cephALEXin (KEFLEX) capsule 500 mg  500 mg Oral Q12H Laveda Norman, MD   500 mg at 01/14/13 1053  . citalopram (CELEXA) tablet 20 mg  20 mg Oral Daily Lars Mage, MD   20 mg at 01/14/13 1053  . clopidogrel (PLAVIX) tablet 75 mg  75 mg Oral Q breakfast Lars Mage, MD   75 mg at 01/14/13 0831  . dextrose 50 % solution 25 mL  25 mL Intravenous PRN  Lars Mage, MD      . docusate sodium (COLACE) capsule 100 mg  100 mg Oral BID Jinger Neighbors, NP   100 mg at 01/14/13 1053  . enoxaparin (LOVENOX) injection 30 mg  30 mg Subcutaneous Q24H Lars Mage, MD   30 mg at 01/13/13 1519  . feeding supplement (GLUCERNA SHAKE) liquid 237 mL  237 mL Oral Q24H Haynes Bast, RD   237 mL at 01/14/13 1015  . hydrALAZINE (APRESOLINE) tablet 50 mg  50 mg Oral BID Laveda Norman, MD   50 mg at 01/14/13 1053  . insulin aspart (novoLOG) injection 0-15 Units  0-15 Units Subcutaneous TID WC Jinger Neighbors, NP   8 Units at 01/14/13 1301  . insulin aspart (novoLOG) injection 0-5 Units  0-5 Units Subcutaneous QHS Jinger Neighbors, NP   3 Units at 01/11/13 2138  . insulin aspart (novoLOG) injection 4 Units  4 Units Subcutaneous TID WC Laveda Norman, MD   4 Units at 01/14/13 1300  . insulin glargine (LANTUS) injection 20 Units  20 Units Subcutaneous QHS Laveda Norman, MD   20 Units at 01/13/13 2238  . ipratropium (ATROVENT)  nebulizer solution 0.5 mg  0.5 mg Nebulization Q6H PRN Calvert Cantor, MD      . latanoprost (XALATAN) 0.005 % ophthalmic solution 1 drop  1 drop Both Eyes QHS Lars Mage, MD   1 drop at 01/13/13 2224  . morphine 2 MG/ML injection 1 mg  1 mg Intravenous Q4H PRN Lars Mage, MD   1 mg at 01/13/13 2235  . multivitamin with minerals tablet 1 tablet  1 tablet Oral Daily Haynes Bast, RD   1 tablet at 01/14/13 1053  . mupirocin ointment (BACTROBAN) 2 % 1 application  1 application Nasal BID Calvert Cantor, MD   1 application at 01/14/13 1053  . ondansetron (ZOFRAN) tablet 4 mg  4 mg Oral Q6H PRN Lars Mage, MD       Or  . ondansetron (ZOFRAN) injection 4 mg  4 mg Intravenous Q6H PRN Lars Mage, MD      . polyethylene glycol (MIRALAX / GLYCOLAX) packet 17 g  17 g Oral Daily PRN Jinger Neighbors, NP   17 g at 01/10/13 0239  . pregabalin (LYRICA) capsule 200 mg  200 mg Oral BID Calvert Cantor, MD   200 mg at 01/14/13 1053  . sodium chloride 0.9 % injection 3 mL  3 mL Intravenous Q12H Lars Mage, MD   3 mL at 01/13/13 2200   ROS: History obtained from chart review  General ROS: negative for - chills, fatigue, fever, night sweats, weight gain or weight loss Psychological ROS: negative for - behavioral disorder, hallucinations, memory difficulties, mood swings or suicidal ideation Ophthalmic ROS: negative for - blurry vision, double vision, eye pain or loss of vision ENT ROS: negative for - epistaxis, nasal discharge, oral lesions, sore throat, tinnitus or vertigo Allergy and Immunology ROS: negative for - hives or itchy/watery eyes Hematological and Lymphatic ROS: negative for - bleeding problems, bruising or swollen lymph nodes Endocrine ROS: negative for - galactorrhea, hair pattern changes, polydipsia/polyuria or temperature intolerance Respiratory ROS: negative for - cough, hemoptysis, shortness of breath or wheezing Cardiovascular ROS: negative for - chest pain, dyspnea on exertion, edema or irregular  heartbeat Gastrointestinal ROS: negative for - abdominal pain, diarrhea, hematemesis, nausea/vomiting or stool incontinence Genito-Urinary ROS: negative for - dysuria, hematuria, incontinence or urinary frequency/urgency Musculoskeletal ROS: negative for - joint swelling  or muscular weakness Neurological ROS: as noted in HPI Dermatological ROS: negative for rash and skin lesion changes   Physical Examination: Blood pressure 133/72, pulse 65, temperature 98.5 F (36.9 C), temperature source Oral, resp. rate 17, height 4\' 11"  (1.499 m), weight 43.2 kg (95 lb 3.8 oz), SpO2 97.00%.  Neurologic Examination: Mental Status: Patient is awake, alert. She signs that she is 53.  Able to follow 2 step commands without difficulty. Cranial Nerves: II: visual fields grossly normal, pupils equal, round, reactive to light and accommodation III,IV, VI: ptosis not present, extra-ocular motions intact bilaterally V,VII: smile symmetric, facial light touch sensation normal bilaterally VIII: hearing normal bilaterally IX,X: gag reflex present XI: trapezius strength/neck flexion strength normal bilaterally XII: tongue strength normal  Motor: Right : Upper extremity   5/5    Left:     Upper extremity   5/5  Lower extremity   5/5     Lower extremity   5/5 Tone and bulk:normal tone throughout; no atrophy noted Sensory: Pinprick and light touch intact throughout, bilaterally Deep Tendon Reflexes: 2+ and symmetric throughout Plantars: Right: downgoing   Left: downgoing Cerebellar: gait not tested secondary to safety concerns    Results for orders placed during the hospital encounter of 01/09/13 (from the past 48 hour(s))  GLUCOSE, CAPILLARY     Status: None   Collection Time    01/12/13  9:37 PM      Result Value Range   Glucose-Capillary 81  70 - 99 mg/dL  MAGNESIUM     Status: None   Collection Time    01/13/13  5:05 AM      Result Value Range   Magnesium 2.0  1.5 - 2.5 mg/dL  CBC     Status:  Abnormal   Collection Time    01/13/13  5:05 AM      Result Value Range   WBC 7.6  4.0 - 10.5 K/uL   RBC 4.27  3.87 - 5.11 MIL/uL   Hemoglobin 10.4 (*) 12.0 - 15.0 g/dL   HCT 16.1 (*) 09.6 - 04.5 %   MCV 72.4 (*) 78.0 - 100.0 fL   MCH 24.4 (*) 26.0 - 34.0 pg   MCHC 33.7  30.0 - 36.0 g/dL   RDW 40.9 (*) 81.1 - 91.4 %   Platelets 182  150 - 400 K/uL  BASIC METABOLIC PANEL     Status: Abnormal   Collection Time    01/13/13  5:05 AM      Result Value Range   Sodium 141  135 - 145 mEq/L   Potassium 4.7  3.5 - 5.1 mEq/L   Chloride 108  96 - 112 mEq/L   CO2 24  19 - 32 mEq/L   Glucose, Bld 157 (*) 70 - 99 mg/dL   BUN 12  6 - 23 mg/dL   Creatinine, Ser 7.82  0.50 - 1.10 mg/dL   Calcium 8.5  8.4 - 95.6 mg/dL   GFR calc non Af Amer 62 (*) >90 mL/min   GFR calc Af Amer 72 (*) >90 mL/min   Comment:            The eGFR has been calculated     using the CKD EPI equation.     This calculation has not been     validated in all clinical     situations.     eGFR's persistently     <90 mL/min signify     possible Chronic Kidney Disease.  GLUCOSE,  CAPILLARY     Status: Abnormal   Collection Time    01/13/13  7:56 AM      Result Value Range   Glucose-Capillary 192 (*) 70 - 99 mg/dL  GLUCOSE, CAPILLARY     Status: Abnormal   Collection Time    01/13/13 11:55 AM      Result Value Range   Glucose-Capillary 204 (*) 70 - 99 mg/dL  GLUCOSE, CAPILLARY     Status: Abnormal   Collection Time    01/13/13  4:40 PM      Result Value Range   Glucose-Capillary 180 (*) 70 - 99 mg/dL  GLUCOSE, CAPILLARY     Status: Abnormal   Collection Time    01/13/13  9:11 PM      Result Value Range   Glucose-Capillary 109 (*) 70 - 99 mg/dL  CBC     Status: Abnormal   Collection Time    01/14/13  5:35 AM      Result Value Range   WBC 6.1  4.0 - 10.5 K/uL   RBC 4.24  3.87 - 5.11 MIL/uL   Hemoglobin 10.4 (*) 12.0 - 15.0 g/dL   HCT 40.9 (*) 81.1 - 91.4 %   MCV 72.9 (*) 78.0 - 100.0 fL   MCH 24.5 (*) 26.0  - 34.0 pg   MCHC 33.7  30.0 - 36.0 g/dL   RDW 78.2 (*) 95.6 - 21.3 %   Platelets 181  150 - 400 K/uL  BASIC METABOLIC PANEL     Status: Abnormal   Collection Time    01/14/13  5:35 AM      Result Value Range   Sodium 141  135 - 145 mEq/L   Potassium 5.0  3.5 - 5.1 mEq/L   Chloride 108  96 - 112 mEq/L   CO2 28  19 - 32 mEq/L   Glucose, Bld 106 (*) 70 - 99 mg/dL   BUN 10  6 - 23 mg/dL   Creatinine, Ser 0.86  0.50 - 1.10 mg/dL   Calcium 9.1  8.4 - 57.8 mg/dL   GFR calc non Af Amer 63 (*) >90 mL/min   GFR calc Af Amer 73 (*) >90 mL/min   Comment:            The eGFR has been calculated     using the CKD EPI equation.     This calculation has not been     validated in all clinical     situations.     eGFR's persistently     <90 mL/min signify     possible Chronic Kidney Disease.  GLUCOSE, CAPILLARY     Status: None   Collection Time    01/14/13  7:47 AM      Result Value Range   Glucose-Capillary 93  70 - 99 mg/dL  GLUCOSE, CAPILLARY     Status: None   Collection Time    01/14/13  7:59 AM      Result Value Range   Glucose-Capillary 96  70 - 99 mg/dL  GLUCOSE, CAPILLARY     Status: Abnormal   Collection Time    01/14/13 11:49 AM      Result Value Range   Glucose-Capillary 254 (*) 70 - 99 mg/dL   Dg Chest 2 View  01/19/9628  *RADIOLOGY REPORT*  Clinical Data: Cough question pneumonia, history hypertension, asthma, diabetes, stroke, vascular disease  CHEST - 2 VIEW  Comparison: 01/10/2013  Findings: Enlargement of cardiac silhouette. Calcified tortuous aorta.  Slight pulmonary vascular congestion. Decreased lung volumes with bibasilar atelectasis. No gross infiltrate, pleural effusion or pneumothorax. No acute osseous findings.  IMPRESSION: Enlargement of cardiac silhouette with slight pulmonary vascular congestion. Low lung volumes with bibasilar atelectasis.   Original Report Authenticated By: Ulyses Southward, M.D.    Mr [facility] Wo Contrast  01/14/2013  *RADIOLOGY REPORT*  Clinical  Data: 62 year old female with altered mental status. Lethargy.  Not following commands.  Decreased communication.  MRI HEAD WITHOUT CONTRAST  Technique:  Multiplanar, multiecho pulse sequences of the brain and surrounding structures were obtained according to standard protocol without intravenous contrast.  Comparison: 11/07/2010.  Findings: Round and linear focus of restricted diffusion at the left caudothalamic notch (series 4 image 15, series 400 image 15). Associated T2 and FLAIR hyperintensity without mass effect or acute hemorrhage.  Underlying numerous chronic bilateral deep gray matter nuclei lacunar infarcts, including a sizable chronic anterior left thalamic lacuna. Major intracranial vascular flow voids are stable.  Chronic brainstem and cerebellar volume loss.  Chronic right greater than left pontine lacunar infarcts.  Patchy periventricular white matter T2 and FLAIR hyperintensity.  Occasional corona radiata chronic lacunar infarcts.  Stable ventricle size and configuration.  No midline shift or intracranial mass effect. Negative pituitary.  Negative cervicomedullary junction and visualized cervical spine.  Normal bone marrow signal.  Negative scalp soft tissues.  IMPRESSION: 1.  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  No mass effect or hemorrhage. 2.  Chronic advanced small vessel ischemia, otherwise not significantly changed since 2012. 3.  MRA findings are below.  MRA HEAD WITHOUT CONTRAST  Technique: Angiographic images of the Circle of Willis were obtained using MRA technique without  intravenous contrast.  Findings: Study is degraded by motion artifact despite repeated imaging attempts.   Stable antegrade flow in the posterior circulation with evidence of chronic distal left vertebral artery occlusion.  Right PICA remains patent.  Vertebrobasilar junction remains patent.  Stable diminutive basilar artery without focal stenosis.  SCA and PCA origins are stable.   Bilateral PCA branches are stable.  Posterior communicating arteries are diminutive or absent.  Stable antegrade flow in both ICA siphons.  Tortuous distal cervical ICA is again noted.  Cavernous segment irregularity is chronic.  Mild to moderate left cavernous ICA stenosis better demonstrated on the comparison.  Carotid termini remain patent. MCA and ACA origins remain patent.  Detail of the bilateral MCA and ACA branches is suboptimal due to motion artifact.  Grossly stable bilateral MCA and ACA branches.  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  Study discussed by telephone with Dr. Susie Cassette on 01/14/2013 at 1620 hours.   Original Report Authenticated By: Erskine Speed, M.D.    Mr Brain Wo Contrast  01/14/2013  *RADIOLOGY REPORT*  Clinical Data: 62 year old female with altered mental status. Lethargy.  Not following commands.  Decreased communication.  MRI HEAD WITHOUT CONTRAST  Technique:  Multiplanar, multiecho pulse sequences of the brain and surrounding structures were obtained according to standard protocol without intravenous contrast.  Comparison: 11/07/2010.  Findings: Round and linear focus of restricted diffusion at the left caudothalamic notch (series 4 image 15, series 400 image 15). Associated T2 and FLAIR hyperintensity without mass effect or acute hemorrhage.  Underlying numerous chronic bilateral deep gray matter nuclei lacunar infarcts, including a sizable chronic anterior left thalamic lacuna. Major intracranial vascular  flow voids are stable.  Chronic brainstem and cerebellar volume loss.  Chronic right greater than left pontine lacunar infarcts.  Patchy periventricular white matter T2 and FLAIR hyperintensity.  Occasional corona radiata chronic lacunar infarcts.  Stable ventricle size and configuration.  No midline shift or intracranial mass effect.  Negative pituitary.  Negative cervicomedullary junction and visualized cervical spine.  Normal bone marrow signal.  Negative scalp soft tissues.  IMPRESSION: 1.  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  No mass effect or hemorrhage. 2.  Chronic advanced small vessel ischemia, otherwise not significantly changed since 2012. 3.  MRA findings are below.  MRA HEAD WITHOUT CONTRAST  Technique: Angiographic images of the Circle of Willis were obtained using MRA technique without  intravenous contrast.  Findings: Study is degraded by motion artifact despite repeated imaging attempts.   Stable antegrade flow in the posterior circulation with evidence of chronic distal left vertebral artery occlusion.  Right PICA remains patent.  Vertebrobasilar junction remains patent.  Stable diminutive basilar artery without focal stenosis.  SCA and PCA origins are stable.  Bilateral PCA branches are stable.  Posterior communicating arteries are diminutive or absent.  Stable antegrade flow in both ICA siphons.  Tortuous distal cervical ICA is again noted.  Cavernous segment irregularity is chronic.  Mild to moderate left cavernous ICA stenosis better demonstrated on the comparison.  Carotid termini remain patent. MCA and ACA origins remain patent.  Detail of the bilateral MCA and ACA branches is suboptimal due to motion artifact.  Grossly stable bilateral MCA and ACA branches.  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  Study discussed by telephone with Dr. Susie Cassette on 01/14/2013 at 1620 hours.   Original Report Authenticated By: Erskine Speed, M.D.     Assessment: 62 y.o. female with altered mental status changes which lead to admission on 01/09/2013 with DKA and UTI. Patients daughter concerned that mental status had not returned to baseline. MRI as above.  Symptoms may be exacerbated by underlying disease processes. Continue stroke workup. Keep NPO until formal speech evaluation.  Stroke Risk Factors - diabetes mellitus, hyperlipidemia, hypertension and stroke  Plan: 1. HgbA1c, fasting lipid panel 2. PT consult, OT consult, Speech consult 3. Echocardiogram 4. Carotid dopplers 5. Prophylactic therapy-Antiplatelet med: Plavix - dose 75mg  6. Risk factor modification    Job Founds, MBA, [place] [facility] Pager [phone] 01/14/2013, 5:30 PM  I personally participate in this patient's case including formulating the above clinical assessment and management recommendations.  Venetia Maxon M.D. [facility] [phone]

## 2013-01-14 NOTE — Discharge Summary (Addendum)
Physician Discharge Summary   Valerie LEFEBER UJW:[id number] DOB: 1951-08-29 DOA: 01/09/2013  PCP: Nelwyn Salisbury, MD  Admit date: 01/09/2013  Discharge date: 01/14/2013  Time spent: 40 minutes    Recommendations for Outpatient Follow-up:  1. Follow up with PMD.  Discharge Diagnoses:  Principal Problem:  DKA (diabetic ketoacidoses)  Active Problems:  DIABETES MELLITUS, TYPE II  HYPERLIPIDEMIA  DEAFNESS  HYPERTENSION  ASTHMA  Stroke  Atherosclerosis of native arteries of the extremities with intermittent claudication  UTI (urinary tract infection)  Hypotension, unspecified   Discharge Condition: Satisfactory.  Diet recommendation: Heart-Healthy/Carbohydrate-Modified.  Discharge instructions Foley catheter discontinued prior to discharge of the patient develops retention the Foley catheter can be replaced  Filed Weights    01/10/13 0500  01/11/13 0500   Weight:  45.36 kg (100 lb)  43.2 kg (95 lb 3.8 oz)      History of present illness:  62 year old female with past medical history most significant for insulin-dependent diabetes, deaf mutism since birth, asthma, chronic kidney disease, peripheral vascular disease, history of stroke, hypertension, hyperlipidemia and glaucoma comes in with chief complaints of altered mental status, decreased oral intake and generalized belly pain. Patient has been noncompliant with her medication since last 2-3 days. Admitted for DKA.    Hospital Course:  1. DKA (diabetic ketoacidoses): Patient is a known insulin-requiring diabetic, and presented in DKA, with altered mental status and generalized abdominal pain, in the setting of possible non-compliance with therapy, and a UTI. Managed with iv fluids, and ivi insulin per Gluco-stabilizer protocol, with resolution of DKA and closure of anion gap. She was successfully transitioned to Lantus/SSI, and CBGs have markedly improved, with adjustment of insulin regimen. Mental status is back to baseline, and  abdominal pain has resolved. Hemoglobin A1c is significantly elevated at 13.6.  Lantus was increased to 20 units at bedtime  2. HYPOTENSION/HISTORY OF HYPERTENSION: Patient was hypotensive on presentation, secondary to DKA, dehydration and volume deletion. Pre-admission antihypertensives were held, and she was managed with aggressive iv fluid resuscitation, with normalization of BP. Iv fluids were discontinued on 01/12/13. As BP started creeping up afterwards, she was reinstated on pre-admisson anti-hypertensives, without deleterious effect.   3. UTI: Urinalysis revealed a positive sediment, consistent with UTI. Patient was managed with iv Rocephin, and urine culture grew pan-sensitive Klebsiella Pneumoniae. Transitioned to oral Kefex on 01/13/13. A total of 7-days of antibiotics is planned, to be concluded on 01/17/13.   4. HYPERLIPIDEMIA: Continued on statin.  5. ASTHMA: Currently stable.  6. Stroke: Stable/Continued on Plavix.  7. PVD: Not problematic at this time.   8. Bruise dorsum of left foot: Patient on 01/13/13, complained of some discomfort and mild pain on the dorsum of left foot. Examination revealed a small subcutaneous hematoma, without evidence of infection. Patient admits to recent fall or trauma. Reassured. No active treatment indicated.   9. AMS patient sent for MRI given AMS , MOST LIKELY HAS A cva , there fore will hold DC , AND START FULL STROKE WORKUP. discussed with bonnie daughter  [phone]      Procedures:  See Below.  Consultations:  N/A.  Discharge Exam:  Filed Vitals:    01/13/13 0524  01/13/13 0900  01/13/13 1100  01/13/13 1500   BP:  132/64   161/89  148/84   Pulse:  68   67  68   Temp:  98.1 F (36.7 C)   98.7 F (37.1 C)  99.2 F (37.3 C)   TempSrc:  Oral  Oral  Oral   Resp:  18   18  16    Height:       Weight:       SpO2:  97%  98%  96%  98%    General: Comfortable, alert, not short of breath at rest.  HEENT: Mild clinical pallor, no jaundice, no  conjunctival injection or discharge. Hydration is satisfactory.  NECK: Supple, JVP not seen, no carotid bruits, no palpable lymphadenopathy, no palpable goiter.  CHEST: Clinically clear to auscultation, no wheezes, no crackles.  HEART: Sounds 1 and 2 heard, normal, regular, no murmurs.  ABDOMEN: Moderately obese, soft, non-tender, no palpable organomegaly, no palpable masses, normal bowel sounds.  GENITALIA: Not examined.  LOWER EXTREMITIES: No pitting edema, palpable peripheral pulses.  MUSCULOSKELETAL SYSTEM: Generalized osteoarthritic changes, otherwise, normal.  CENTRAL NERVOUS SYSTEM: No focal neurologic deficit on gross examination.  Discharge Instructions     Medication List    TAKE these medications       albuterol 108 (90 BASE) MCG/ACT inhaler  Commonly known as:  PROVENTIL HFA;VENTOLIN HFA  Inhale 2 puffs into the lungs every 4 (four) hours as needed for wheezing or shortness of breath.     albuterol-ipratropium 18-103 MCG/ACT inhaler  Commonly known as:  COMBIVENT  Inhale 2 puffs into the lungs 4 (four) times daily.     atenolol 50 MG tablet  Commonly known as:  TENORMIN  Take 50 mg by mouth daily.     azelastine 137 MCG/SPRAY nasal spray  Commonly known as:  ASTELIN  Place 1 spray into the nose 2 (two) times daily. Use in each nostril as directed     budesonide-formoterol 160-4.5 MCG/ACT inhaler  Commonly known as:  SYMBICORT  Inhale 2 puffs into the lungs 2 (two) times daily.     cephALEXin 500 MG capsule  Commonly known as:  KEFLEX  Take 1 capsule (500 mg total) by mouth 3 (three) times daily.     citalopram 20 MG tablet  Commonly known as:  CELEXA  Take 20 mg by mouth daily.     clopidogrel 75 MG tablet  Commonly known as:  PLAVIX  Take 75 mg by mouth daily.     fluticasone 50 MCG/ACT nasal spray  Commonly known as:  FLONASE  Place 2 sprays into the nose daily.     glucose blood test strip  1 each by Other route 4 (four) times daily. Use as  instructed     BAYER BREEZE 2 TEST Disk  Generic drug:  Glucose Blood  2 (two) times daily.     hydrALAZINE 50 MG tablet  Commonly known as:  APRESOLINE  Take 50 mg by mouth 2 (two) times daily.     insulin aspart 100 UNIT/ML injection  Commonly known as:  novoLOG  Inject 15 Units into the skin 3 (three) times daily before meals.     insulin glargine 100 UNIT/ML injection  Commonly known as:  LANTUS  Inject 0.2 mLs (20 Units total) into the skin at bedtime.     latanoprost 0.005 % ophthalmic solution  Commonly known as:  XALATAN  Place 1 drop into both eyes at bedtime.     montelukast 10 MG tablet  Commonly known as:  SINGULAIR  Take 1 tablet (10 mg total) by mouth at bedtime.     pantoprazole 40 MG tablet  Commonly known as:  PROTONIX  Take 1 tablet (40 mg total) by mouth daily.     pregabalin 200 MG capsule  Commonly known as:  LYRICA  Take 1 capsule (200 mg total) by mouth 2 (two) times daily.     promethazine 25 MG tablet  Commonly known as:  PHENERGAN  Take 25 mg by mouth every 4 (four) hours as needed for nausea.     rosuvastatin 40 MG tablet  Commonly known as:  CRESTOR  Take 1 tablet (40 mg total) by mouth daily.             The results of significant diagnostics from this hospitalization (including imaging, microbiology, ancillary and laboratory) are listed below for reference.   Significant Diagnostic Studies:  Ct Head (brain) Wo Contrast  01/09/2013 *RADIOLOGY REPORT* Clinical Data: Altered mental status, lethargic, not following commands, deaf patient not signing today CT HEAD WITHOUT CONTRAST Technique: Contiguous axial images were obtained from the base of the skull through the vertex without contrast. Comparison: 11/05/2010 Findings: Generalized atrophy. Stable mild ex vacuo dilatation of the ventricular system. No midline shift or mass effect. Small vessel chronic ischemic changes of deep cerebral white matter. Old lacunar infarcts at left caudate  head and left thalamus. No intracranial hemorrhage, mass lesion, or evidence of acute infarction. No extra-axial fluid collections. Bones and sinuses unremarkable. IMPRESSION: Atrophy with small vessel chronic ischemic changes of deep cerebral white matter. Old lacunar infarcts left caudate head and left thalamus. No acute intracranial abnormalities. Original Report Authenticated By: Ulyses Southward, M.D.  Dg Abd Acute W/chest  01/10/2013 *RADIOLOGY REPORT* Clinical Data: Evaluate for bowel obstruction or pneumonia. ACUTE ABDOMEN SERIES (ABDOMEN 2 VIEW & CHEST 1 VIEW) Comparison: Abdominal radiograph 11/08/2010. Chest x-ray 11/09/2010. Findings: Lung volumes are slightly low. No definite consolidative airspace disease. No pleural effusions. No evidence of pulmonary edema. Heart size is normal. The patient is rotated to the left on today's exam, resulting in distortion of the mediastinal contours and reduced diagnostic sensitivity and specificity for mediastinal pathology. Atherosclerosis in the thoracic aorta. Supine and left lateral decubitus views of the abdomen demonstrate gas and stool scattered throughout the colon extending to the level of the distal rectum. There are multiple nondilated gas filled loops of small bowel throughout the central abdomen which are conspicuous, but are nonspecific. No pneumoperitoneum. Surgical clips project over the right upper quadrant of the abdomen, likely from prior cholecystectomy. IMPRESSION: 1. Nonspecific, nonobstructive bowel gas pattern, as above. 2. Surgical clips in the right upper quadrant of the abdomen, likely from prior cholecystectomy. 3. Low lung volumes without radiographic evidence of acute cardiopulmonary disease. 4. Atherosclerosis. Original Report Authenticated By: Trudie Reed, M.D.  Microbiology:  Recent Results (from the past 240 hour(s))   URINE CULTURE Status: None    Collection Time    01/09/13 10:42 PM   Result  Value  Range  Status    Specimen  Description  URINE, CATHETERIZED   Final    Special Requests  NONE   Final    Culture Setup Time  01/10/2013 03:34   Final    Colony Count  >=100,000 COLONIES/ML   Final    Culture  KLEBSIELLA PNEUMONIAE   Final    Report Status  01/12/2013 FINAL   Final    Organism ID, Bacteria  KLEBSIELLA PNEUMONIAE   Final   MRSA PCR SCREENING Status: Abnormal    Collection Time    01/10/13 2:15 AM   Result  Value  Range  Status    MRSA by PCR  POSITIVE (*)  NEGATIVE  Final    Comment:  The GeneXpert MRSA Assay (FDA     approved for NASAL specimens     only), is one component of a     comprehensive MRSA colonization     surveillance program. It is not     intended to diagnose MRSA     infection nor to guide or     monitor treatment for     MRSA infections.     RESULT CALLED TO, READ BACK BY AND VERIFIED WITH:     Sheliah Plane [id number] [facility]    Labs:  Basic Metabolic Panel:   Recent Labs  Lab  01/10/13 0612  01/10/13 1355  01/11/13 0620  01/11/13 0824  01/12/13 0550  01/13/13 0505   NA  156*  151*  136  --  140  141   K  3.1*  3.1*  3.2*  --  4.2  4.7   CL  119*  117*  102  --  108  108   CO2  24  23  21   --  23  24   GLUCOSE  188*  167*  247*  --  178*  157*   BUN  17  14  14   --  13  12   CREATININE  1.27*  1.28*  1.37*  --  0.99  0.97   CALCIUM  8.7  8.3*  7.9*  --  7.8*  8.5   MG  --  --  --  1.5  --  2.0    Liver Function Tests:   Recent Labs  Lab  01/09/13 2209   AST  13   ALT  9   ALKPHOS  96   BILITOT  0.4   PROT  7.6   ALBUMIN  3.2*    No results found for this basename: LIPASE, AMYLASE, in the last 168 hours  No results found for this basename: AMMONIA, in the last 168 hours  CBC:   Recent Labs  Lab  01/09/13 2209  01/10/13 0612  01/11/13 0620  01/13/13 0505   WBC  8.0  11.7*  9.4  7.6   NEUTROABS  6.7  8.5*  --  --   HGB  12.7  12.3  10.3*  10.4*   HCT  38.4  36.6  31.2*  30.9*   MCV  74.9*  73.8*  74.6*  72.4*   PLT  342  279  188  182     Cardiac Enzymes:   Recent Labs  Lab  01/09/13 2212  01/10/13 0257  01/10/13 0853  01/10/13 1355   TROPONINI  <0.30  <0.30  <0.30  <0.30    BNP:  BNP (last 3 results)  No results found for this basename: PROBNP, in the last 8760 hours  CBG:   Recent Labs  Lab  01/12/13 1634  01/12/13 2137  01/13/13 0756  01/13/13 1155  01/13/13 1640   GLUCAP  265*  81  192*  204*  180*

## 2013-01-14 NOTE — Progress Notes (Signed)
SLP Cancellation Note  Patient Details Name: LUVERN MISCHKE MRN: [id number] DOB: 1950/12/17   Cancelled treatment:       Reason Eval/Treat Not Completed: Patient at procedure or test/unavailable.  Pt remains out of room for procedure.  Will f/u next date if not discharged.   Blenda Mounts Laurice 01/14/2013, 5:05 PM

## 2013-01-14 NOTE — Progress Notes (Signed)
SLP Cancellation Note  Patient Details Name: Valerie Rodriguez MRN: [id number] DOB: 01/01/1951   Cancelled treatment:       Reason Eval/Treat Not Completed: Patient at procedure or test/unavailable. Will f/u at next date.  Berdine Dance SLP student Berdine Dance 01/14/2013, 3:55 PM

## 2013-01-15 DIAGNOSIS — I6350 Cerebral infarction due to unspecified occlusion or stenosis of unspecified cerebral artery: Secondary | ICD-10-CM

## 2013-01-15 DIAGNOSIS — I519 Heart disease, unspecified: Secondary | ICD-10-CM

## 2013-01-15 LAB — GLUCOSE, CAPILLARY
Glucose-Capillary: 136 mg/dL — ABNORMAL HIGH (ref 70–99)
Glucose-Capillary: 154 mg/dL — ABNORMAL HIGH (ref 70–99)
Glucose-Capillary: 114 mg/dL — ABNORMAL HIGH (ref 70–99)

## 2013-01-15 MED ORDER — INSULIN GLARGINE 100 UNIT/ML ~~LOC~~ SOLN
5.0000 [IU] | Freq: Once | SUBCUTANEOUS | Status: AC
  Administered 2013-01-15: 5 [IU] via SUBCUTANEOUS
  Filled 2013-01-15: qty 0.05

## 2013-01-15 MED ORDER — STROKE: EARLY STAGES OF RECOVERY BOOK
Freq: Once | Status: AC
  Administered 2013-01-15: 20:00:00
  Filled 2013-01-15: qty 1

## 2013-01-15 MED ORDER — INSULIN GLARGINE 100 UNIT/ML ~~LOC~~ SOLN
25.0000 [IU] | Freq: Every day | SUBCUTANEOUS | Status: DC
  Administered 2013-01-15: 25 [IU] via SUBCUTANEOUS
  Filled 2013-01-15 (×2): qty 0.25

## 2013-01-15 NOTE — Progress Notes (Signed)
[facility] PROGRESS NOTE  Valerie Rodriguez OZH:[id number] DOB: 17-May-1951 DOA: 01/09/2013 PCP: Nelwyn Salisbury, MD  Assessment/Plan: Principal Problem:   DKA (diabetic ketoacidoses) Active Problems:   DIABETES MELLITUS, TYPE II   HYPERLIPIDEMIA   DEAFNESS   HYPERTENSION   ASTHMA   Stroke   Atherosclerosis of native arteries of the extremities with intermittent claudication   UTI (urinary tract infection)   Hypotension, unspecified    History of present illness:  62 year old female with past medical history most significant for insulin-dependent diabetes, deaf mutism since birth, asthma, chronic kidney disease, peripheral vascular disease, history of stroke, hypertension, hyperlipidemia and glaucoma comes in with chief complaints of altered mental status, decreased oral intake and generalized belly pain. Patient has been noncompliant with her medication since last 2-3 days. Admitted for DKA.    Hospital Course:  1. DKA (diabetic ketoacidoses): Patient is a known insulin-requiring diabetic, and presented in DKA, with altered mental status and generalized abdominal pain, in the setting of possible non-compliance with therapy, and a UTI. Managed with iv fluids, and ivi insulin per Gluco-stabilizer protocol, with resolution of DKA and closure of anion gap. She was successfully transitioned to Lantus/SSI, and CBGs have markedly improved, with adjustment of insulin regimen. Hemoglobin A1c of 13.6 Lantus was increased to 25 units at bedtime today  2. HYPOTENSION/HISTORY OF HYPERTENSION: Patient was hypotensive on presentation, secondary to DKA, dehydration and volume deletion. Pre-admission antihypertensives were held, and she was managed with aggressive iv fluid resuscitation, with normalization of BP. Iv fluids were discontinued on 01/12/13. As BP started creeping up afterwards, she was reinstated on pre-admisson anti-hypertensives.  3. UTI: Urinalysis revealed a positive sediment,  consistent with Klebsiella pneumoniae UTI. Patient was managed with iv Rocephin, and urine culture grew pan-sensitive Klebsiella Pneumoniae. Transitioned to oral Kefex on 01/13/13. A total of 7-days of antibiotics is planned, to be concluded on 01/17/13.   4. HYPERLIPIDEMIA: Continued on statin.  5. ASTHMA: Currently stable.   6. new onset Stroke: On Plavix at home. May consider switching to Aggrenox and the patient was already on Plavix. Neurology has been consulted. 2-D echo pending. Right internal carotid artery shows stenosis of less than 40%. No obvious evidence of hemodynamically significant left carotid artery stenosis. Lipid panel pending. Continue statin  7. PVD: Not problematic at this time.   8. Bruise dorsum of left foot: Patient on 01/13/13, complained of some discomfort and mild pain on the dorsum of left foot. Examination small subcutaneous hematoma, without evidence of infection. Patient admits to recent fall or trauma. Reassured. No active treatment indicated.     discussed with [name] daughter [phone]       Code Status: full Family Communication: family updated about patient's clinical progress Disposition Plan:  Reassess with PT and OT   Consultants:  Neurology Procedures:  Head CT scan.  CXR/Abdominal X-Ray.  MRI Antibiotics:  Rocephin 01/09/13>>> Cephalexin 4/1  HPI/Subjective: Nonverbal  Objective: Filed Vitals:   01/15/13 0010 01/15/13 0210 01/15/13 0420 01/15/13 0600  BP: 154/75 150/70 142/58 158/68  Pulse: 63 64 65 68  Temp: 97.9 F (36.6 C) 98.3 F (36.8 C) 98.6 F (37 C) 98.2 F (36.8 C)  TempSrc: Oral Oral Oral Oral  Resp: 20 18 18 18   Height:      Weight:      SpO2: 96% 97% 96% 96%    Intake/Output Summary (Last 24 hours) at 01/15/13 0750 Last data filed at 01/14/13 1900  Gross per 24 hour  Intake  360 ml  Output      0 ml  Net    360 ml    Exam:  HENT:  Head: Atraumatic.  Nose: Nose normal.  Mouth/Throat: Oropharynx is  clear and moist.  Eyes: Conjunctivae are normal. Pupils are equal, round, and reactive to light. No scleral icterus.  Neck: Neck supple. No tracheal deviation present.  Cardiovascular: Normal rate, regular rhythm, normal heart sounds and intact distal pulses.  Pulmonary/Chest: Effort normal and breath sounds normal. No respiratory distress.  Abdominal: Soft. Normal appearance and bowel sounds are normal. She exhibits no distension. There is no tenderness.  Musculoskeletal: She exhibits no edema and no tenderness.  Neurological: She is alert. No cranial nerve deficit.    Data Reviewed: Basic Metabolic Panel:  Recent Labs Lab 01/10/13 1355 01/11/13 0620 01/11/13 0824 01/12/13 0550 01/13/13 0505 01/14/13 0535  NA 151* 136  --  140 141 141  K 3.1* 3.2*  --  4.2 4.7 5.0  CL 117* 102  --  108 108 108  CO2 23 21  --  23 24 28   GLUCOSE 167* 247*  --  178* 157* 106*  BUN 14 14  --  13 12 10   CREATININE 1.28* 1.37*  --  0.99 0.97 0.95  CALCIUM 8.3* 7.9*  --  7.8* 8.5 9.1  MG  --   --  1.5  --  2.0  --     Liver Function Tests:  Recent Labs Lab 01/09/13 2209  AST 13  ALT 9  ALKPHOS 96  BILITOT 0.4  PROT 7.6  ALBUMIN 3.2*   No results found for this basename: LIPASE, AMYLASE,  in the last 168 hours No results found for this basename: AMMONIA,  in the last 168 hours  CBC:  Recent Labs Lab 01/09/13 2209 01/10/13 0612 01/11/13 0620 01/13/13 0505 01/14/13 0535  WBC 8.0 11.7* 9.4 7.6 6.1  NEUTROABS 6.7 8.5*  --   --   --   HGB 12.7 12.3 10.3* 10.4* 10.4*  HCT 38.4 36.6 31.2* 30.9* 30.9*  MCV 74.9* 73.8* 74.6* 72.4* 72.9*  PLT 342 279 188 182 181    Cardiac Enzymes:  Recent Labs Lab 01/09/13 2212 01/10/13 0257 01/10/13 0853 01/10/13 1355  TROPONINI <0.30 <0.30 <0.30 <0.30   BNP (last 3 results) No results found for this basename: PROBNP,  in the last 8760 hours   CBG:  Recent Labs Lab 01/14/13 0747 01/14/13 0759 01/14/13 1149 01/14/13 1756  01/14/13 2124  GLUCAP 93 96 254* 97 144*    Recent Results (from the past 240 hour(s))  URINE CULTURE     Status: None   Collection Time    01/09/13 10:42 PM      Result Value Range Status   Specimen Description URINE, CATHETERIZED   Final   Special Requests NONE   Final   Culture  Setup Time 01/10/2013 03:34   Final   Colony Count >=100,000 COLONIES/ML   Final   Culture KLEBSIELLA PNEUMONIAE   Final   Report Status 01/12/2013 FINAL   Final   Organism ID, Bacteria KLEBSIELLA PNEUMONIAE   Final  MRSA PCR SCREENING     Status: Abnormal   Collection Time    01/10/13  2:15 AM      Result Value Range Status   MRSA by PCR POSITIVE (*) NEGATIVE Final   Comment:            The GeneXpert MRSA Assay (FDA     approved for NASAL  specimens     only), is one component of a     comprehensive MRSA colonization     surveillance program. It is not     intended to diagnose MRSA     infection nor to guide or     monitor treatment for     MRSA infections.     RESULT CALLED TO, READ BACK BY AND VERIFIED WITH:     Sheliah Plane [id number] [phone] [facility]     Studies: Dg Chest 2 View  01/14/2013  *RADIOLOGY REPORT*  Clinical Data: Cough question pneumonia, history hypertension, asthma, diabetes, stroke, vascular disease  CHEST - 2 VIEW  Comparison: 01/10/2013  Findings: Enlargement of cardiac silhouette. Calcified tortuous aorta. Slight pulmonary vascular congestion. Decreased lung volumes with bibasilar atelectasis. No gross infiltrate, pleural effusion or pneumothorax. No acute osseous findings.  IMPRESSION: Enlargement of cardiac silhouette with slight pulmonary vascular congestion. Low lung volumes with bibasilar atelectasis.   Original Report Authenticated By: Ulyses Southward, M.D.    Ct Head (brain) Wo Contrast  01/09/2013  *RADIOLOGY REPORT*  Clinical Data: Altered mental status, lethargic, not following commands, deaf patient not signing today  CT HEAD WITHOUT CONTRAST  Technique:  Contiguous axial images  were obtained from the base of the skull through the vertex without contrast.  Comparison: 11/05/2010  Findings: Generalized atrophy. Stable mild ex vacuo dilatation of the ventricular system. No midline shift or mass effect. Small vessel chronic ischemic changes of deep cerebral white matter. Old lacunar infarcts at left caudate head and left thalamus. No intracranial hemorrhage, mass lesion, or evidence of acute infarction. No extra-axial fluid collections. Bones and sinuses unremarkable.  IMPRESSION: Atrophy with small vessel chronic ischemic changes of deep cerebral white matter. Old lacunar infarcts left caudate head and left thalamus. No acute intracranial abnormalities.   Original Report Authenticated By: Ulyses Southward, M.D.    Mr [facility] Wo Contrast  01/14/2013  *RADIOLOGY REPORT*  Clinical Data: 62 year old female with altered mental status. Lethargy.  Not following commands.  Decreased communication.  MRI HEAD WITHOUT CONTRAST  Technique:  Multiplanar, multiecho pulse sequences of the brain and surrounding structures were obtained according to standard protocol without intravenous contrast.  Comparison: 11/07/2010.  Findings: Round and linear focus of restricted diffusion at the left caudothalamic notch (series 4 image 15, series 400 image 15). Associated T2 and FLAIR hyperintensity without mass effect or acute hemorrhage.  Underlying numerous chronic bilateral deep gray matter nuclei lacunar infarcts, including a sizable chronic anterior left thalamic lacuna. Major intracranial vascular flow voids are stable.  Chronic brainstem and cerebellar volume loss.  Chronic right greater than left pontine lacunar infarcts.  Patchy periventricular white matter T2 and FLAIR hyperintensity.  Occasional corona radiata chronic lacunar infarcts.  Stable ventricle size and configuration.  No midline shift or intracranial mass effect. Negative pituitary.  Negative cervicomedullary junction and visualized cervical spine.   Normal bone marrow signal.  Negative scalp soft tissues.  IMPRESSION: 1.  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  No mass effect or hemorrhage. 2.  Chronic advanced small vessel ischemia, otherwise not significantly changed since 2012. 3.  MRA findings are below.  MRA HEAD WITHOUT CONTRAST  Technique: Angiographic images of the Circle of Willis were obtained using MRA technique without  intravenous contrast.  Findings: Study is degraded by motion artifact despite repeated imaging attempts.   Stable antegrade flow in the posterior circulation with evidence of chronic distal left vertebral artery occlusion.  Right PICA remains patent.  Vertebrobasilar junction remains patent.  Stable diminutive basilar artery without focal stenosis.  SCA and PCA origins are stable.  Bilateral PCA branches are stable.  Posterior communicating arteries are diminutive or absent.  Stable antegrade flow in both ICA siphons.  Tortuous distal cervical ICA is again noted.  Cavernous segment irregularity is chronic.  Mild to moderate left cavernous ICA stenosis better demonstrated on the comparison.  Carotid termini remain patent. MCA and ACA origins remain patent.  Detail of the bilateral MCA and ACA branches is suboptimal due to motion artifact.  Grossly stable bilateral MCA and ACA branches.  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  Study discussed by telephone with Dr. Susie Cassette on 01/14/2013 at 1620 hours.   Original Report Authenticated By: Erskine Speed, M.D.    Mr Brain Wo Contrast  01/14/2013  *RADIOLOGY REPORT*  Clinical Data: 62 year old female with altered mental status. Lethargy.  Not following commands.  Decreased communication.  MRI HEAD WITHOUT CONTRAST  Technique:  Multiplanar, multiecho pulse sequences of the brain and surrounding structures were  obtained according to standard protocol without intravenous contrast.  Comparison: 11/07/2010.  Findings: Round and linear focus of restricted diffusion at the left caudothalamic notch (series 4 image 15, series 400 image 15). Associated T2 and FLAIR hyperintensity without mass effect or acute hemorrhage.  Underlying numerous chronic bilateral deep gray matter nuclei lacunar infarcts, including a sizable chronic anterior left thalamic lacuna. Major intracranial vascular flow voids are stable.  Chronic brainstem and cerebellar volume loss.  Chronic right greater than left pontine lacunar infarcts.  Patchy periventricular white matter T2 and FLAIR hyperintensity.  Occasional corona radiata chronic lacunar infarcts.  Stable ventricle size and configuration.  No midline shift or intracranial mass effect. Negative pituitary.  Negative cervicomedullary junction and visualized cervical spine.  Normal bone marrow signal.  Negative scalp soft tissues.  IMPRESSION: 1.  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  No mass effect or hemorrhage. 2.  Chronic advanced small vessel ischemia, otherwise not significantly changed since 2012. 3.  MRA findings are below.  MRA HEAD WITHOUT CONTRAST  Technique: Angiographic images of the Circle of Willis were obtained using MRA technique without  intravenous contrast.  Findings: Study is degraded by motion artifact despite repeated imaging attempts.   Stable antegrade flow in the posterior circulation with evidence of chronic distal left vertebral artery occlusion.  Right PICA remains patent.  Vertebrobasilar junction remains patent.  Stable diminutive basilar artery without focal stenosis.  SCA and PCA origins are stable.  Bilateral PCA branches are stable.  Posterior communicating arteries are diminutive or absent.  Stable antegrade flow in both ICA siphons.  Tortuous distal cervical ICA is again noted.  Cavernous segment irregularity is chronic.  Mild to  moderate left cavernous ICA stenosis better demonstrated on the comparison.  Carotid termini remain patent. MCA and ACA origins remain patent.  Detail of the bilateral MCA and ACA branches is suboptimal due to motion artifact.  Grossly stable bilateral MCA and ACA branches.  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  Study discussed by telephone with Dr. Susie Cassette on 01/14/2013 at 1620 hours.   Original Report Authenticated By: Erskine Speed, M.D.    Dg Abd Acute  W/chest  01/10/2013  *RADIOLOGY REPORT*  Clinical Data: Evaluate for bowel obstruction or pneumonia.  ACUTE ABDOMEN SERIES (ABDOMEN 2 VIEW & CHEST 1 VIEW)  Comparison: Abdominal radiograph 11/08/2010.  Chest x-ray 11/09/2010.  Findings: Lung volumes are slightly low.  No definite consolidative airspace disease.  No pleural effusions.  No evidence of pulmonary edema.  Heart size is normal. The patient is rotated to the left on today's exam, resulting in distortion of the mediastinal contours and reduced diagnostic sensitivity and specificity for mediastinal pathology.  Atherosclerosis in the thoracic aorta.  Supine and left lateral decubitus views of the abdomen demonstrate gas and stool scattered throughout the colon extending to the level of the distal rectum.  There are multiple nondilated gas filled loops of small bowel throughout the central abdomen which are conspicuous, but are nonspecific.  No pneumoperitoneum.  Surgical clips project over the right upper quadrant of the abdomen, likely from prior cholecystectomy.  IMPRESSION: 1.  Nonspecific, nonobstructive bowel gas pattern, as above. 2.  Surgical clips in the right upper quadrant of the abdomen, likely from prior cholecystectomy. 3.  Low lung volumes without radiographic evidence of acute cardiopulmonary disease. 4.  Atherosclerosis.   Original Report  Authenticated By: Trudie Reed, M.D.     Scheduled Meds: . atenolol  50 mg Oral Daily  . atorvastatin  40 mg Oral q1800  . budesonide-formoterol  2 puff Inhalation BID  . cephALEXin  500 mg Oral Q12H  . citalopram  20 mg Oral Daily  . clopidogrel  75 mg Oral Q breakfast  . docusate sodium  100 mg Oral BID  . enoxaparin (LOVENOX) injection  30 mg Subcutaneous Q24H  . feeding supplement  237 mL Oral Q24H  . hydrALAZINE  50 mg Oral BID  . insulin aspart  0-15 Units Subcutaneous TID WC  . insulin aspart  0-5 Units Subcutaneous QHS  . insulin aspart  4 Units Subcutaneous TID WC  . insulin glargine  20 Units Subcutaneous QHS  . latanoprost  1 drop Both Eyes QHS  . multivitamin with minerals  1 tablet Oral Daily  . pregabalin  200 mg Oral BID  . sodium chloride  3 mL Intravenous Q12H   Continuous Infusions:   Principal Problem:   DKA (diabetic ketoacidoses) Active Problems:   DIABETES MELLITUS, TYPE II   HYPERLIPIDEMIA   DEAFNESS   HYPERTENSION   ASTHMA   Stroke   Atherosclerosis of native arteries of the extremities with intermittent claudication   UTI (urinary tract infection)   Hypotension, unspecified    Time spent: 40 minutes   [facility]  [facility] Pager [phone]. If 8PM-8AM, please contact night-coverage at [email or web address], password [facility] 01/15/2013, 7:50 AM  LOS: 6 days

## 2013-01-15 NOTE — Progress Notes (Signed)
  Echocardiogram 2D Echocardiogram has been performed.  Georgian Co 01/15/2013, 11:07 AM

## 2013-01-15 NOTE — Progress Notes (Signed)
Utilization review completed.  

## 2013-01-15 NOTE — Progress Notes (Signed)
OT Cancellation Note  Patient Details Name: Valerie Rodriguez MRN: [id number] DOB: 1951/10/01   Cancelled Treatment:    Reason Eval/Treat Not Completed: Other (comment) (noted plans for pt to DC to SNF) Will defer OT eval to SNF.  Alba Cory 01/15/2013, 10:19 AM

## 2013-01-15 NOTE — Evaluation (Signed)
Clinical/Bedside Swallow Evaluation Patient Details  Name: Valerie Rodriguez MRN: [id number] Date of Birth: 10-30-1950  Today's Date: 01/15/2013 Time: 0807-0828 SLP Time Calculation (min): 21 min  Past Medical History:  Past Medical History  Diagnosis Date  . Hypertension   . Hyperlipemia   . Deaf-mutism   . Glaucoma(365)     Dr Eulah Pont  . Diabetes mellitus     Type II, sees Dr. Candie Chroman  . Allergy   . Asthma     sees Dr [place] Callas  . Stroke 11-05-10    sees Dr. Pearlean Brownie  . Chronic kidney disease     sees Dr. Camille Bal   . PVD (peripheral vascular disease)     sees Dr. Hart [place]   . PAD (peripheral artery disease)    Past Surgical History:  Past Surgical History  Procedure Laterality Date  . Cholecystectomy    . Abdominal hysterectomy      oophorectomy 1976  . Rotator cuff repair  2004  . Colonoscopy  03-30-09    per Dr. Marina Goodell, poor prep, repeat one yr    HPI:  Valerie Rodriguez is an 62 y.o. female who was admitted 01/09/2013 with 2 day history of altered mental status. Diagnosed with DKA and UTI. Prior to discharge, patient's daughter did not feel that her mental status improved and MRI Brain was done that showed acute on chronic small vessel infarct in the left deep gray matter nuclei and chronic left lacunar infarcts in pons. Patient was also noted today to cough with po intake and has been made NPO until speech therapy can evaluate. 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   Assessment / Plan / Recommendation Clinical Impression  Pt demonstrates adequate swallow function with liquids and solids. No evidence of aspiration even with large continuous sips. There is an audible swallow indicative of delay or structural impairment, but again there is no evidence of impact on function or safety with PO. Pt is able to feed self and masticate solids in left buccal cavity where she has dentition. Recommend regular diet and thin liquids. No SLP f/u needed.      Aspiration Risk  Mild    Diet Recommendation Regular;Thin liquid   Liquid Administration via: Cup;Straw Medication Administration: Whole meds with liquid Supervision: Patient able to self feed Postural Changes and/or Swallow Maneuvers: Seated upright 90 degrees    Other  Recommendations Oral Care Recommendations: Oral care BID   Follow Up Recommendations  None    Frequency and Duration        Pertinent Vitals/Pain NA    SLP Swallow Goals     Swallow Study Prior Functional Status       General HPI: Valerie Rodriguez is an 62 y.o. female who was admitted 01/09/2013 with 2 day history of altered mental status. Diagnosed with DKA and UTI. Prior to discharge, patient's daughter did not feel that her mental status improved and MRI Brain was done that showed acute on chronic small vessel infarct in the left deep gray matter nuclei and chronic left lacunar infarcts in pons. Patient was also noted today to cough with po intake and has been made NPO until speech therapy can evaluate. 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 Type of Study: Bedside swallow evaluation Previous Swallow Assessment: none in  Diet Prior to this Study: NPO Temperature Spikes Noted: No Respiratory Status: Room air History of Recent Intubation: No Behavior/Cognition: Alert;Cooperative;Pleasant mood Oral Cavity - Dentition: Missing dentition Self-Feeding  Abilities: Able to feed self Patient Positioning: Upright in bed Baseline Vocal Quality: Other (comment) (does not phonate, mute. clear with cough, occasional humm) Volitional Cough: Strong Volitional Swallow: Unable to elicit    Oral/Motor/Sensory Function Overall Oral Motor/Sensory Function: Appears within functional limits for tasks assessed   Ice Chips     Thin Liquid Thin Liquid: Impaired Presentation: Cup;Straw;Self Fed Pharyngeal  Phase Impairments: Suspected delayed Swallow    Nectar Thick Nectar Thick Liquid: Not tested   Honey  Thick Honey Thick Liquid: Not tested   Puree Puree: Impaired Presentation: Self Fed;Spoon Pharyngeal Phase Impairments: Suspected delayed Swallow   Solid   GO    Solid: Impaired Presentation: Self Fed Pharyngeal Phase Impairments: Suspected delayed Swallow       Deerica Waszak, Riley Nearing 01/15/2013,8:36 AM

## 2013-01-15 NOTE — Progress Notes (Signed)
Stroke Team Progress Note  HISTORY Valerie Rodriguez is an 62 y.o. female who was admitted 01/09/2013 with 2 day history of altered mental status. Diagnosed with DKA and UTI. 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 Patient was also noted today to cough with po intake and has been made NPO until speech therapy can evaluate. 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  SUBJECTIVE Her daughter is at the bedside.  Overall she feels her condition is stable, back to where she was a few weeks ago.  OBJECTIVE Most recent Vital Signs: Filed Vitals:   01/15/13 0210 01/15/13 0420 01/15/13 0600 01/15/13 0933  BP: 150/70 142/58 158/68 141/68  Pulse: 64 65 68   Temp: 98.3 F (36.8 C) 98.6 F (37 C) 98.2 F (36.8 C)   TempSrc: Oral Oral Oral   Resp: 18 18 18    Height:      Weight:      SpO2: 97% 96% 96%    CBG (last 3)   Recent Labs  01/14/13 1756 01/14/13 2124 01/15/13 0729  GLUCAP 97 144* 136*    IV Fluid Intake:     MEDICATIONS  . atenolol  50 mg Oral Daily  . atorvastatin  40 mg Oral q1800  . budesonide-formoterol  2 puff Inhalation BID  . cephALEXin  500 mg Oral Q12H  . citalopram  20 mg Oral Daily  . clopidogrel  75 mg Oral Q breakfast  . docusate sodium  100 mg Oral BID  . enoxaparin (LOVENOX) injection  30 mg Subcutaneous Q24H  . feeding supplement  237 mL Oral Q24H  . hydrALAZINE  50 mg Oral BID  . insulin aspart  0-15 Units Subcutaneous TID WC  . insulin aspart  0-5 Units Subcutaneous QHS  . insulin aspart  4 Units Subcutaneous TID WC  . insulin glargine  25 Units Subcutaneous QHS  . latanoprost  1 drop Both Eyes QHS  . multivitamin with minerals  1 tablet Oral Daily  . pregabalin  200 mg Oral BID  . sodium chloride  3 mL Intravenous Q12H   PRN:  albuterol, dextrose, ipratropium, metoprolol, morphine  injection, ondansetron (ZOFRAN) IV, ondansetron, polyethylene glycol  Diet:  Carb Control thin liquids Activity:   DVT Prophylaxis:  Lovenox 30 mg sq daily   CLINICALLY SIGNIFICANT STUDIES Basic Metabolic Panel:  Recent Labs Lab 01/11/13 0824  01/13/13 0505 01/14/13 0535  NA  --   < > 141 141  K  --   < > 4.7 5.0  CL  --   < > 108 108  CO2  --   < > 24 28  GLUCOSE  --   < > 157* 106*  BUN  --   < > 12 10  CREATININE  --   < > 0.97 0.95  CALCIUM  --   < > 8.5 9.1  MG 1.5  --  2.0  --   < > = values in this interval not displayed. Liver Function Tests:  Recent Labs Lab 01/09/13 2209  AST 13  ALT 9  ALKPHOS 96  BILITOT 0.4  PROT 7.6  ALBUMIN 3.2*   CBC:  Recent Labs Lab 01/09/13 2209 01/10/13 0612  01/13/13 0505 01/14/13 0535  WBC 8.0 11.7*  < > 7.6 6.1  NEUTROABS 6.7 8.5*  --   --   --  HGB 12.7 12.3  < > 10.4* 10.4*  HCT 38.4 36.6  < > 30.9* 30.9*  MCV 74.9* 73.8*  < > 72.4* 72.9*  PLT 342 279  < > 182 181  < > = values in this interval not displayed. Coagulation:  Recent Labs Lab 01/09/13 2209  LABPROT 13.9  INR 1.08   Cardiac Enzymes:  Recent Labs Lab 01/10/13 0257 01/10/13 0853 01/10/13 1355  TROPONINI <0.30 <0.30 <0.30   Urinalysis:  Recent Labs Lab 01/09/13 2242  COLORURINE YELLOW  LABSPEC 1.026  PHURINE 5.0  GLUCOSEU >1000*  HGBUR TRACE*  BILIRUBINUR NEGATIVE  KETONESUR 15*  PROTEINUR NEGATIVE  UROBILINOGEN 0.2  NITRITE POSITIVE*  LEUKOCYTESUR NEGATIVE   Lipid Panel    Component Value Date/Time   CHOL 152 01/14/2013 1732   TRIG 144 01/14/2013 1732   HDL 37* 01/14/2013 1732   CHOLHDL 4.1 01/14/2013 1732   VLDL 29 01/14/2013 1732   LDLCALC 86 01/14/2013 1732   HgbA1C  Lab Results  Component Value Date   HGBA1C 13.6* 01/10/2013    Urine Drug Screen:   No results found for this basename: labopia, cocainscrnur, labbenz, amphetmu, thcu, labbarb    Alcohol Level: No results found for this basename: ETH,  in the last 168 hours  CT  of the brain    MRI of the brain  01/14/2013  1.  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  No mass effect or hemorrhage. 2.  Chronic advanced small vessel ischemia, otherwise not significantly changed since 2012.    MRA of the brain  01/14/2013   Allowing for motion artifact, stable intracranial MRA since 2012, including chronic. 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    2D Echocardiogram    Carotid Doppler  The right internal carotid artery demonstrates elevated velocities suggestive of stenosis <40%. There is no obvious evidence of hemodynamically significant left carotid artery stenosis. Vertebral arteries are patent with antegrade flow.  CXR  01/14/2013  Enlargement of cardiac silhouette with slight pulmonary vascular congestion. Low lung volumes with bibasilar atelectasis.     EKG  sinus tachycardia.   Therapy Recommendations SNF  Physical Exam   Middle aged lady not in distress.Awake alert. Afebrile. Head is nontraumatic. Neck is supple without bruit. Hearing is normal. Cardiac exam no murmur or gallop. Lungs are clear to auscultation. Distal pulses are well felt. Neurological exam ; Awake  Alert oriented x 3. Normal speech and language.eye movements full without nystagmus.fundi were not visualized. Vision acuity and fields appear normal. Hearing is normal. Palatal moments are normal. Face symmetric. Tongue midline. Normal strength, tone, reflexes and coordination. Normal sensation. Gait deferred. ;  ASSESSMENT [name] is a 62 y.o. female presenting with altered mental status. 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 Incidental, asymptomatic infarct felt to be thrombotic secondary to small vessel disease.  Altered mental status from metabolic encephalopathy from DKA. On clopidogrel 75 mg orally every day prior to admission. Now on clopidogrel 75 mg orally every day for secondary  stroke prevention. Patient with no resultant neuro deficits from stroke.   Hypertension Hyperlipidemia, LDL 86, on statin PTA, on statin now, recommended goal LDL < 70 for diabetics Diabetes, HgbA1c 13.6 Hx stroke PVD PAD  Hospital day # 6  TREATMENT/PLAN  Continue clopidogrel 75 mg orally every day for secondary stroke prevention.  F/u 2D results  Annie Main, MSN, RN, ANVP-BC, ANP-BC, GNP-BC [facility] Pager: [phone] 01/15/2013 9:40 AM  I have personally obtained a history, examined the patient, evaluated imaging results, and formulated the assessment and plan of care. I agree with the above. Delia Heady, MD

## 2013-01-15 NOTE — Progress Notes (Signed)
NUTRITION FOLLOW UP  Intervention:    Continue Glucerna Shake daily (220 kcals, 9.9 gm protein per 8 fl oz bottle) RD to follow for nutrition care plan  Nutrition Dx:   Inadequate oral intake related to poor appetite as evidenced by staff report, improving  Goal:   Oral intake with meals & supplements to meet >/= 90% of estimated nutrition needs, progressing  Monitor:   PO & supplemental intake, weight, labs, I/O's  Assessment:   Patient s/p bedside swallow evaluation 4/3  ---> patient demonstrated adequate swallow function. PO intake improved at 50-75% per flowsheet records.  Receiving Glucerna Shake supplement and MVI daily.  Noted discharge planned yesterday, however, cancelled due to change in patient's medical status.  Height: Ht Readings from Last 1 Encounters:  01/10/13 4\' 11"  (1.499 m)    Weight Status:   Wt Readings from Last 1 Encounters:  01/11/13 95 lb 3.8 oz (43.2 kg)    Re-estimated needs:  Kcal: 1300-1500 Protein: 60-70 gm Fluid: > 1.5 L  Skin: Intact  Diet Order: Carb Control   Intake/Output Summary (Last 24 hours) at 01/15/13 1444 Last data filed at 01/14/13 1900  Gross per 24 hour  Intake    120 ml  Output      0 ml  Net    120 ml    Last BM: 3/29  Labs:   Recent Labs Lab 01/11/13 0824 01/12/13 0550 01/13/13 0505 01/14/13 0535  NA  --  140 141 141  K  --  4.2 4.7 5.0  CL  --  108 108 108  CO2  --  23 24 28   BUN  --  13 12 10   CREATININE  --  0.99 0.97 0.95  CALCIUM  --  7.8* 8.5 9.1  MG 1.5  --  2.0  --   GLUCOSE  --  178* 157* 106*    CBG (last 3)   Recent Labs  01/14/13 2124 01/15/13 0729 01/15/13 1201  GLUCAP 144* 136* 249*    Scheduled Meds: . atenolol  50 mg Oral Daily  . atorvastatin  40 mg Oral q1800  . budesonide-formoterol  2 puff Inhalation BID  . cephALEXin  500 mg Oral Q12H  . citalopram  20 mg Oral Daily  . clopidogrel  75 mg Oral Q breakfast  . docusate sodium  100 mg Oral BID  . enoxaparin  (LOVENOX) injection  30 mg Subcutaneous Q24H  . feeding supplement  237 mL Oral Q24H  . hydrALAZINE  50 mg Oral BID  . insulin aspart  0-15 Units Subcutaneous TID WC  . insulin aspart  0-5 Units Subcutaneous QHS  . insulin aspart  4 Units Subcutaneous TID WC  . insulin glargine  25 Units Subcutaneous QHS  . latanoprost  1 drop Both Eyes QHS  . multivitamin with minerals  1 tablet Oral Daily  . pregabalin  200 mg Oral BID  . sodium chloride  3 mL Intravenous Q12H    Continuous Infusions:   Maureen Chatters, RD, LDN Pager #: [phone] After-Hours Pager #: [phone]

## 2013-01-15 NOTE — Progress Notes (Signed)
Physical Therapy Treatment Patient Details Name: Valerie Rodriguez MRN: [id number] DOB: 1950/12/25 Today's Date: 01/15/2013 Time: 4098-1191 PT Time Calculation (min): 32 min  PT Assessment / Plan / Recommendation Comments on Treatment Session  Valerie Rodriguez is an 62 y.o. female who was admitted 01/09/2013 with 2 day history of altered mental status. Diagnosed with DKA and UTI. 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 Presents to PT today with overall general weakness, demonstrating left lateral lean with ambulation and LUE appears weaker than RUE. D/c plan and goals reman appropriate.    Follow Up Recommendations  SNF;Supervision/Assistance - 24 hour     Does the patient have the potential to tolerate intense rehabilitation     Barriers to Discharge        Equipment Recommendations  None recommended by PT    Recommendations for Other Services    Frequency Min 3X/week   Plan Discharge plan remains appropriate;Frequency remains appropriate    Precautions / Restrictions Precautions Precautions: Fall Restrictions Weight Bearing Restrictions: No   Pertinent Vitals/Pain Does report pain in her legs    Mobility  Bed Mobility Bed Mobility: Supine to Sit;Sitting - Scoot to Edge of Bed Supine to Sit: 4: Min assist Sitting - Scoot to [place] of Bed: 3: Mod assist Details for Bed Mobility Assistance: slow to initiate, needs assist to scoot hips with use of pad Transfers Transfers: Sit to Stand;Stand to Sit;Stand Pivot Transfers Sit to Stand: 3: Mod assist;With upper extremity assist Stand to Sit: With upper extremity assist Stand Pivot Transfers: 1: +2 Total assist Stand Pivot Transfers: Patient Percentage: 60% Details for Transfer Assistance: mod cueing for safety, definitely needs to use upper extremities to assist, facilitation for  tall posture and to step to 3in1 Ambulation/Gait Ambulation/Gait Assistance: 3: Mod assist Ambulation Distance (Feet): 10 Feet Assistive device: Rolling walker Ambulation/Gait Assistance Details: mod facilitation for tall and midline posture as well as safety for positioning with RW; pt very anteriorly flexed with lateral trunk flexion to the left, with sign language interpreter she can achieve better posture but unable to maintain for longer than a few seconds Gait Pattern: Step-through pattern;Trunk flexed;Decreased stride length;Lateral trunk lean to left      PT Goals Acute Rehab PT Goals PT Goal: Supine/Side to Sit - Progress: Progressing toward goal PT Goal: Sit at Edge Of Bed - Progress: Progressing toward goal PT Goal: Sit to Stand - Progress: Progressing toward goal PT Goal: Stand to Sit - Progress: Progressing toward goal PT Goal: Ambulate - Progress: Progressing toward goal  Visit Information  Last PT Received On: 01/15/13 Assistance Needed: +2    Subjective Data  Subjective: Patient complaining of painful legs below her knees.    Cognition  Cognition Overall Cognitive Status: Difficult to assess Area of Impairment: Safety/judgement Difficult to assess due to: Hard of hearing/deaf Arousal/Alertness: Awake/alert Orientation Level: Disoriented to;Place;Time;Situation Behavior During Session: [facility] for tasks performed Safety/Judgement: Decreased safety judgement for tasks assessed;Decreased awareness of need for assistance Cognition - Other Comments: per sign language interpreter when I asked her who she lived with she replied [name] her daughter but then couldn't tell what city or what her address was (unsure if this is new)    Balance     End of Session PT - End of Session Equipment Utilized During Treatment: Gait belt Activity Tolerance: Patient  tolerated treatment well;Patient limited by fatigue Patient left: in chair;with call bell/phone within reach Nurse  Communication: Mobility status   GP     [facility] HELEN 01/15/2013, 4:12 PM

## 2013-01-16 DIAGNOSIS — G43009 Migraine without aura, not intractable, without status migrainosus: Secondary | ICD-10-CM

## 2013-01-16 LAB — GLUCOSE, CAPILLARY
Glucose-Capillary: 127 mg/dL — ABNORMAL HIGH (ref 70–99)
Glucose-Capillary: 226 mg/dL — ABNORMAL HIGH (ref 70–99)

## 2013-01-16 MED ORDER — INSULIN GLARGINE 100 UNIT/ML ~~LOC~~ SOLN: 25.0000 [IU] | Freq: Every day | SUBCUTANEOUS | Status: DC

## 2013-01-16 NOTE — Progress Notes (Signed)
Stroke Team Progress Note  HISTORY Valerie Rodriguez is an 62 y.o. female who was admitted 01/09/2013 with 2 day history of altered mental status. Diagnosed with DKA and UTI. 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 Patient was also noted today to cough with po intake and has been made NPO until speech therapy can evaluate. 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  SUBJECTIVE There are no family members at the bedside today. The patient feels she is improved. She is currently on isolation precautions. Stable. No changes  OBJECTIVE Most recent Vital Signs: Filed Vitals:   01/16/13 0400 01/16/13 0800 01/16/13 1030 01/16/13 1032  BP: 130/58 149/63 141/73   Pulse: 63 64  68  Temp: 97.6 F (36.4 C) 98 F (36.7 C)    TempSrc: Oral     Resp: 18 16    Height:      Weight:      SpO2: 99% 98%     CBG (last 3)   Recent Labs  01/15/13 1201 01/15/13 1650 01/15/13 2159  GLUCAP 249* 154* 114*    IV Fluid Intake:     MEDICATIONS  . atenolol  50 mg Oral Daily  . atorvastatin  40 mg Oral q1800  . budesonide-formoterol  2 puff Inhalation BID  . cephALEXin  500 mg Oral Q12H  . citalopram  20 mg Oral Daily  . clopidogrel  75 mg Oral Q breakfast  . docusate sodium  100 mg Oral BID  . enoxaparin (LOVENOX) injection  30 mg Subcutaneous Q24H  . feeding supplement  237 mL Oral Q24H  . hydrALAZINE  50 mg Oral BID  . insulin aspart  0-15 Units Subcutaneous TID WC  . insulin aspart  0-5 Units Subcutaneous QHS  . insulin aspart  4 Units Subcutaneous TID WC  . insulin glargine  25 Units Subcutaneous QHS  . latanoprost  1 drop Both Eyes QHS  . multivitamin with minerals  1 tablet Oral Daily  . pregabalin  200 mg Oral BID  . sodium chloride  3 mL Intravenous Q12H   PRN:  albuterol, dextrose, ipratropium, metoprolol, morphine  injection, ondansetron (ZOFRAN) IV, ondansetron, polyethylene glycol  Diet:  Carb Control thin liquids Activity:  Bathroom privileges DVT Prophylaxis:  Lovenox 30 mg sq daily   CLINICALLY SIGNIFICANT STUDIES Basic Metabolic Panel:  Recent Labs Lab 01/11/13 0824  01/13/13 0505 01/14/13 0535  NA  --   < > 141 141  K  --   < > 4.7 5.0  CL  --   < > 108 108  CO2  --   < > 24 28  GLUCOSE  --   < > 157* 106*  BUN  --   < > 12 10  CREATININE  --   < > 0.97 0.95  CALCIUM  --   < > 8.5 9.1  MG 1.5  --  2.0  --   < > = values in this interval not displayed. Liver Function Tests:   Recent Labs Lab 01/09/13 2209  AST 13  ALT 9  ALKPHOS 96  BILITOT 0.4  PROT 7.6  ALBUMIN 3.2*   CBC:  Recent Labs Lab 01/09/13 2209 01/10/13 0612  01/13/13 0505 01/14/13 0535  WBC 8.0 11.7*  < > 7.6 6.1  NEUTROABS 6.7 8.5*  --   --   --  HGB 12.7 12.3  < > 10.4* 10.4*  HCT 38.4 36.6  < > 30.9* 30.9*  MCV 74.9* 73.8*  < > 72.4* 72.9*  PLT 342 279  < > 182 181  < > = values in this interval not displayed. Coagulation:   Recent Labs Lab 01/09/13 2209  LABPROT 13.9  INR 1.08   Cardiac Enzymes:   Recent Labs Lab 01/10/13 0257 01/10/13 0853 01/10/13 1355  TROPONINI <0.30 <0.30 <0.30   Urinalysis:   Recent Labs Lab 01/09/13 2242  COLORURINE YELLOW  LABSPEC 1.026  PHURINE 5.0  GLUCOSEU >1000*  HGBUR TRACE*  BILIRUBINUR NEGATIVE  KETONESUR 15*  PROTEINUR NEGATIVE  UROBILINOGEN 0.2  NITRITE POSITIVE*  LEUKOCYTESUR NEGATIVE   Lipid Panel    Component Value Date/Time   CHOL 152 01/14/2013 1732   TRIG 144 01/14/2013 1732   HDL 37* 01/14/2013 1732   CHOLHDL 4.1 01/14/2013 1732   VLDL 29 01/14/2013 1732   LDLCALC 86 01/14/2013 1732   HgbA1C  Lab Results  Component Value Date   HGBA1C 13.6* 01/10/2013    Urine Drug Screen:   No results found for this basename: labopia,  cocainscrnur,  labbenz,  amphetmu,  thcu,  labbarb    Alcohol Level: No results found for this basename:  ETH,  in the last 168 hours  CT of the brain    MRI of the brain  01/14/2013  1.  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  No mass effect or hemorrhage. 2.  Chronic advanced small vessel ischemia, otherwise not significantly changed since 2012.    MRA of the brain  01/14/2013   Allowing for motion artifact, stable intracranial MRA since 2012, including chronic. 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    2D Echocardiogram  ejection fraction 60-65%. No cardiac source of emboli identified.  Carotid Doppler  The right internal carotid artery demonstrates elevated velocities suggestive of stenosis <40%. There is no obvious evidence of hemodynamically significant left carotid artery stenosis. Vertebral arteries are patent with antegrade flow.  CXR  01/14/2013  Enlargement of cardiac silhouette with slight pulmonary vascular congestion. Low lung volumes with bibasilar atelectasis.     EKG  sinus tachycardia.   Therapy Recommendations SNF  Physical Exam   Middle aged lady not in distress.Awake alert. Afebrile. Head is nontraumatic. Neck is supple without bruit. Hearing is normal. Cardiac exam no murmur or gallop. Lungs are clear to auscultation. Distal pulses are well felt. Neurological exam ; Awake  Alert oriented x 3. Normal speech and language.eye movements full without nystagmus.fundi were not visualized. Vision acuity and fields appear normal. Hearing is normal. Palatal moments are normal. Face symmetric. Tongue midline. Normal strength, tone, reflexes and coordination. Normal sensation. Gait deferred. ;  ASSESSMENT [name] is a 62 y.o. female presenting with altered mental status. 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 Incidental, asymptomatic infarct felt to be thrombotic secondary to small vessel disease.  Altered mental status from metabolic encephalopathy from DKA. On clopidogrel  75 mg orally every day prior to admission. Now on clopidogrel 75 mg orally every day for secondary stroke prevention. Patient with no resultant neuro deficits from stroke.   Hypertension Hyperlipidemia, LDL 86, on statin PTA, on statin now, recommended goal LDL < 70 for diabetics Diabetes, HgbA1c 13.6 Hx stroke PVD PAD  Hospital day # 7  TREATMENT/PLAN  Continue clopidogrel 75 mg orally every day for  secondary stroke prevention.  The stroke team will sign off at this time. Followup with Dr. Pearlean Brownie in 2 months.  Delton See PA-C [facility] Pager [phone] 01/16/2013, 11:08 AM  I have personally obtained a history, examined the patient, evaluated imaging results, and formulated the assessment and plan of care. I agree with the above.  Delia Heady, MD

## 2013-01-16 NOTE — Discharge Summary (Addendum)
Physician Discharge Summary   Valerie Rodriguez JXB:[id number] DOB: 12/19/1950 DOA: 01/09/2013  PCP: Nelwyn Salisbury, MD  Admit date: 01/09/2013  Discharge date: 01/16/2013  Time spent: 40 minutes  Recommendations for Outpatient Follow-up:  1. Follow up with PMD.  2.  Discharge Diagnoses:  left parietal deep white matter infarct  DKA (diabetic ketoacidoses)  Active Problems:  DIABETES MELLITUS, TYPE II  HYPERLIPIDEMIA  DEAFNESS  HYPERTENSION  ASTHMA  Stroke  Atherosclerosis of native arteries of the extremities with intermittent claudication  UTI (urinary tract infection)  Hypotension, unspecified    Discharge Condition: Satisfactory.  Diet recommendation: Heart-Healthy/Carbohydrate-Modified.  Discharge instructions Foley catheter discontinued prior to discharge of the patient develops retention the Foley catheter can be replaced    Filed Weights    01/10/13 0500  01/11/13 0500   Weight:  45.36 kg (100 lb)  43.2 kg (95 lb 3.8 oz)    History of present illness:  62 year old female with past medical history most significant for insulin-dependent diabetes, deaf mutism since birth, asthma, chronic kidney disease, peripheral vascular disease, history of stroke, hypertension, hyperlipidemia and glaucoma comes in with chief complaints of altered mental status, decreased oral intake and generalized belly pain. Patient has been noncompliant with her medication since last 2-3 days. Admitted for DKA.    Hospital Course:  1. DKA (diabetic ketoacidoses): Patient is a known insulin-requiring diabetic, and presented in DKA, with altered mental status and generalized abdominal pain, in the setting of possible non-compliance with therapy, and a UTI. Managed with iv fluids, and ivi insulin per Gluco-stabilizer protocol, with resolution of DKA and closure of anion gap. She was successfully transitioned to Lantus/SSI, and CBGs have markedly improved, with adjustment of insulin regimen. Hemoglobin A1c of  13.6 Lantus was increased to 25 units at bedtime today  2. HYPOTENSION/HISTORY OF HYPERTENSION: Patient was hypotensive on presentation, secondary to DKA, dehydration and volume deletion. Pre-admission antihypertensives were held, and she was managed with aggressive iv fluid resuscitation, with normalization of BP. Iv fluids were discontinued on 01/12/13. As BP started creeping up afterwards, she was reinstated on pre-admisson anti-hypertensives.   3. UTI: Urinalysis revealed a positive sediment, consistent with Klebsiella pneumoniae UTI. Patient was managed with iv Rocephin, and urine culture grew pan-sensitive Klebsiella Pneumoniae. Transitioned to oral Kefex on 01/13/13. A total of 7-days of antibiotics is planned, to be concluded on 01/17/13.   4. HYPERLIPIDEMIA: Continued on statin.  5. ASTHMA: Currently stable.    6. new onset Stroke:MRI Brain was done that showed acute on chronic small vessel infarct in the left deep gray matter nuclei  On Plavix at home. 2-D echo completed. Right internal carotid artery shows stenosis of less than 40%. No obvious evidence of hemodynamically significant left carotid artery stenosis. Lipid panel shows LDL of 86, triglycerides 152, HDL 37. Continue statin . Neurology recommends to continue Plavix. Patient being discharged to SNF  7. PVD: Not problematic at this time.   8. Bruise dorsum of left foot: Patient on 01/13/13, complained of some discomfort and mild pain on the dorsum of left foot. Examination small subcutaneous hematoma, without evidence of infection. Patient admits to recent fall or trauma. Reassured. No active treatment indicated.  discussed with bonnie daughter [phone]         Procedures:  See Below.  Consultations:  N/A.  Discharge Exam:  Filed Vitals:    01/13/13 0524  01/13/13 0900  01/13/13 1100  01/13/13 1500   BP:  132/64   161/89  148/84  Pulse:  68   67  68   Temp:  98.1 F (36.7 C)   98.7 F (37.1 C)  99.2 F (37.3 C)    TempSrc:  Oral   Oral  Oral   Resp:  18   18  16    Height:       Weight:       SpO2:  97%  98%  96%  98%     General: Comfortable, alert, not short of breath at rest.  HEENT: Mild clinical pallor, no jaundice, no conjunctival injection or discharge. Hydration is satisfactory.  NECK: Supple, JVP not seen, no carotid bruits, no palpable lymphadenopathy, no palpable goiter.  CHEST: Clinically clear to auscultation, no wheezes, no crackles.  HEART: Sounds 1 and 2 heard, normal, regular, no murmurs.  ABDOMEN: Moderately obese, soft, non-tender, no palpable organomegaly, no palpable masses, normal bowel sounds.  GENITALIA: Not examined.  LOWER EXTREMITIES: No pitting edema, palpable peripheral pulses.  MUSCULOSKELETAL SYSTEM: Generalized osteoarthritic changes, otherwise, normal.  CENTRAL NERVOUS SYSTEM: No focal neurologic deficit on gross examination.      Medication List    TAKE these medications       albuterol 108 (90 BASE) MCG/ACT inhaler  Commonly known as:  PROVENTIL HFA;VENTOLIN HFA  Inhale 2 puffs into the lungs every 4 (four) hours as needed for wheezing or shortness of breath.     albuterol-ipratropium 18-103 MCG/ACT inhaler  Commonly known as:  COMBIVENT  Inhale 2 puffs into the lungs 4 (four) times daily.     atenolol 50 MG tablet  Commonly known as:  TENORMIN  Take 50 mg by mouth daily.     azelastine 137 MCG/SPRAY nasal spray  Commonly known as:  ASTELIN  Place 1 spray into the nose 2 (two) times daily. Use in each nostril as directed     budesonide-formoterol 160-4.5 MCG/ACT inhaler  Commonly known as:  SYMBICORT  Inhale 2 puffs into the lungs 2 (two) times daily.     cephALEXin 500 MG capsule  Commonly known as:  KEFLEX  Take 1 capsule (500 mg total) by mouth 3 (three) times daily.     citalopram 20 MG tablet  Commonly known as:  CELEXA  Take 20 mg by mouth daily.     clopidogrel 75 MG tablet  Commonly known as:  PLAVIX  Take 75 mg by mouth daily.      fluticasone 50 MCG/ACT nasal spray  Commonly known as:  FLONASE  Place 2 sprays into the nose daily.     glucose blood test strip  1 each by Other route 4 (four) times daily. Use as instructed     BAYER BREEZE 2 TEST Disk  Generic drug:  Glucose Blood  2 (two) times daily.     hydrALAZINE 50 MG tablet  Commonly known as:  APRESOLINE  Take 50 mg by mouth 2 (two) times daily.     insulin aspart 100 UNIT/ML injection  Commonly known as:  novoLOG  Inject 15 Units into the skin 3 (three) times daily before meals.     insulin glargine 100 UNIT/ML injection  Commonly known as:  LANTUS  Inject 0.2 mLs (20 Units total) into the skin at bedtime.     insulin glargine 100 UNIT/ML injection  Commonly known as:  LANTUS  Inject 0.25 mLs (25 Units total) into the skin at bedtime.     latanoprost 0.005 % ophthalmic solution  Commonly known as:  XALATAN  Place 1 drop into both  eyes at bedtime.     montelukast 10 MG tablet  Commonly known as:  SINGULAIR  Take 1 tablet (10 mg total) by mouth at bedtime.     pantoprazole 40 MG tablet  Commonly known as:  PROTONIX  Take 1 tablet (40 mg total) by mouth daily.     pregabalin 200 MG capsule  Commonly known as:  LYRICA  Take 1 capsule (200 mg total) by mouth 2 (two) times daily.     promethazine 25 MG tablet  Commonly known as:  PHENERGAN  Take 25 mg by mouth every 4 (four) hours as needed for nausea.     rosuvastatin 40 MG tablet  Commonly known as:  CRESTOR  Take 1 tablet (40 mg total) by mouth daily.          The results of significant diagnostics from this hospitalization (including imaging, microbiology, ancillary and laboratory) are listed below for reference.   Significant Diagnostic Studies:  Ct Head (brain) Wo Contrast  01/09/2013 *RADIOLOGY REPORT* Clinical Data: Altered mental status, lethargic, not following commands, deaf patient not signing today CT HEAD WITHOUT CONTRAST Technique: Contiguous axial images were  obtained from the base of the skull through the vertex without contrast. Comparison: 11/05/2010 Findings: Generalized atrophy. Stable mild ex vacuo dilatation of the ventricular system. No midline shift or mass effect. Small vessel chronic ischemic changes of deep cerebral white matter. Old lacunar infarcts at left caudate head and left thalamus. No intracranial hemorrhage, mass lesion, or evidence of acute infarction. No extra-axial fluid collections. Bones and sinuses unremarkable. IMPRESSION: Atrophy with small vessel chronic ischemic changes of deep cerebral white matter. Old lacunar infarcts left caudate head and left thalamus. No acute intracranial abnormalities. Original Report Authenticated By: Ulyses Southward, M.D.  Dg Abd Acute W/chest  01/10/2013 *RADIOLOGY REPORT* Clinical Data: Evaluate for bowel obstruction or pneumonia. ACUTE ABDOMEN SERIES (ABDOMEN 2 VIEW & CHEST 1 VIEW) Comparison: Abdominal radiograph 11/08/2010. Chest x-ray 11/09/2010. Findings: Lung volumes are slightly low. No definite consolidative airspace disease. No pleural effusions. No evidence of pulmonary edema. Heart size is normal. The patient is rotated to the left on today's exam, resulting in distortion of the mediastinal contours and reduced diagnostic sensitivity and specificity for mediastinal pathology. Atherosclerosis in the thoracic aorta. Supine and left lateral decubitus views of the abdomen demonstrate gas and stool scattered throughout the colon extending to the level of the distal rectum. There are multiple nondilated gas filled loops of small bowel throughout the central abdomen which are conspicuous, but are nonspecific. No pneumoperitoneum. Surgical clips project over the right upper quadrant of the abdomen, likely from prior cholecystectomy. IMPRESSION: 1. Nonspecific, nonobstructive bowel gas pattern, as above. 2. Surgical clips in the right upper quadrant of the abdomen, likely from prior cholecystectomy. 3. Low lung  volumes without radiographic evidence of acute cardiopulmonary disease. 4. Atherosclerosis. Original Report Authenticated By: Trudie Reed, M.D.  Microbiology:  Recent Results (from the past 240 hour(s))   URINE CULTURE Status: None    Collection Time    01/09/13 10:42 PM   Result  Value  Range  Status    Specimen Description  URINE, CATHETERIZED   Final    Special Requests  NONE   Final    Culture Setup Time  01/10/2013 03:34   Final    Colony Count  >=100,000 COLONIES/ML   Final    Culture  KLEBSIELLA PNEUMONIAE   Final    Report Status  01/12/2013 FINAL   Final  Organism ID, Bacteria  KLEBSIELLA PNEUMONIAE   Final   MRSA PCR SCREENING Status: Abnormal    Collection Time    01/10/13 2:15 AM   Result  Value  Range  Status    MRSA by PCR  POSITIVE (*)  NEGATIVE  Final    Comment:      The GeneXpert MRSA Assay (FDA     approved for NASAL specimens     only), is one component of a     comprehensive MRSA colonization     surveillance program. It is not     intended to diagnose MRSA     infection nor to guide or     monitor treatment for     MRSA infections.     RESULT CALLED TO, READ BACK BY AND VERIFIED WITH:     Sheliah Plane [id number] [facility]   Labs:  Basic Metabolic Panel:  Recent Labs  Lab  01/10/13 0612  01/10/13 1355  01/11/13 0620  01/11/13 0824  01/12/13 0550  01/13/13 0505   NA  156*  151*  136  --  140  141   K  3.1*  3.1*  3.2*  --  4.2  4.7   CL  119*  117*  102  --  108  108   CO2  24  23  21   --  23  24   GLUCOSE  188*  167*  247*  --  178*  157*   BUN  17  14  14   --  13  12   CREATININE  1.27*  1.28*  1.37*  --  0.99  0.97   CALCIUM  8.7  8.3*  7.9*  --  7.8*  8.5   MG  --  --  --  1.5  --  2.0   Liver Function Tests:  Recent Labs  Lab  01/09/13 2209   AST  13   ALT  9   ALKPHOS  96   BILITOT  0.4   PROT  7.6   ALBUMIN  3.2*   No results found for this basename: LIPASE, AMYLASE, in the last 168 hours  No results found for this basename:  AMMONIA, in the last 168 hours  CBC:  Recent Labs  Lab  01/09/13 2209  01/10/13 0612  01/11/13 0620  01/13/13 0505   WBC  8.0  11.7*  9.4  7.6   NEUTROABS  6.7  8.5*  --  --   HGB  12.7  12.3  10.3*  10.4*   HCT  38.4  36.6  31.2*  30.9*   MCV  74.9*  73.8*  74.6*  72.4*   PLT  342  279  188  182   Cardiac Enzymes:  Recent Labs  Lab  01/09/13 2212  01/10/13 0257  01/10/13 0853  01/10/13 1355   TROPONINI  <0.30  <0.30  <0.30  <0.30   BNP:  BNP (last 3 results)  No results found for this basename: PROBNP, in the last 8760 hours  CBG:  Recent Labs  Lab  01/12/13 1634  01/12/13 2137  01/13/13 0756  01/13/13 1155  01/13/13 1640   GLUCAP  265*  81  192*  204*  180*

## 2013-01-16 NOTE — Progress Notes (Signed)
Pt will be d/c to [facility] today via [employer] at [facility]. CSW hand faxed the d/c summary and made phone contact with Larita Fife -Admission Coordinator who is covering for [employer] at [facility].  No other CSW needs at this time.  Sherald Barge, LCSW-A Clinical Social Worker [phone]

## 2013-02-12 ENCOUNTER — Encounter: Payer: Self-pay | Admitting: Internal Medicine

## 2013-03-13 ENCOUNTER — Ambulatory Visit (INDEPENDENT_AMBULATORY_CARE_PROVIDER_SITE_OTHER): Payer: Medicare Other | Admitting: Internal Medicine

## 2013-03-13 ENCOUNTER — Encounter: Payer: Self-pay | Admitting: Internal Medicine

## 2013-03-13 VITALS — BP 130/70 | HR 72 | Ht 63.0 in | Wt 154.2 lb

## 2013-03-13 DIAGNOSIS — E1310 Other specified diabetes mellitus with ketoacidosis without coma: Secondary | ICD-10-CM

## 2013-03-13 DIAGNOSIS — Z1211 Encounter for screening for malignant neoplasm of colon: Secondary | ICD-10-CM

## 2013-03-13 DIAGNOSIS — I6789 Other cerebrovascular disease: Secondary | ICD-10-CM

## 2013-03-13 DIAGNOSIS — E1110 Type 2 diabetes mellitus with ketoacidosis without coma: Secondary | ICD-10-CM

## 2013-03-13 NOTE — Patient Instructions (Addendum)
Please follow up with Dr. Perry as needed 

## 2013-03-13 NOTE — Progress Notes (Signed)
HISTORY OF PRESENT ILLNESS:  Valerie Rodriguez is a 62 y.o. female with multiple significant medical problems including hypertension, hyperlipidemia, insulin requiring diabetes mellitus with a history of diabetic you acidosis, asthma, chronic renal insufficiency, peripheral vascular disease, and cerebrovascular disease with prior CVA and significant residual weakness. Patient is deaf mute. She is accompanied by her daughter as well as a sign Presenter, broadcasting. She is sent by her primary provider regarding screening colonoscopy. The patient underwent screening colonoscopy 03/30/2009. The examination was carried out to the cecum but was limited by poor preparation. Followup in 1 year with vigorous preparation recommended. Unfortunately, since that time, the patient has had a marked decline in her health. She has had prior stroke with resultant immobility. She is on Plavix therapy. Unable to be care for at home and is now in a skilled nursing facility. Recent hospitalization with diabetic ketoacidosis. No family history of colon cancer. No GI complaints such as abdominal pain, change in bowel habits (except for transient constipation while in the hospital), or bleeding.  REVIEW OF SYSTEMS:  All non-GI ROS negative except for weakness and fatigue  Past Medical History  Diagnosis Date  . Hypertension   . Hyperlipemia   . Deaf-mutism   . Glaucoma     Dr Eulah Pont  . Diabetes mellitus     Type II, sees Dr. Candie Chroman  . Allergy   . Asthma     sees Dr [place] Callas  . Stroke 11-05-10    sees Dr. Pearlean Brownie  . Chronic kidney disease     sees Dr. Camille Bal   . PVD (peripheral vascular disease)     sees Dr. Hart Rochester   . PAD (peripheral artery disease)     Past Surgical History  Procedure Laterality Date  . Cholecystectomy    . Abdominal hysterectomy      oophorectomy 1976  . Rotator cuff repair  2004  . Colonoscopy  03-30-09    per Dr. Marina Goodell, poor prep, repeat one yr     Social History Valerie Rodriguez  reports that she quit smoking about 33 years ago. Her smoking use included Cigarettes. She smoked 0.00 packs per day. She has never used smokeless tobacco. She reports that she does not drink alcohol or use illicit drugs.  family history includes Coronary artery disease in an unspecified family member; Diabetes in her mother and unspecified family member; Heart disease in her mother; Hyperlipidemia in her mother and unspecified family member; and Hypertension in her mother and unspecified family member.  No Known Allergies     PHYSICAL EXAMINATION: Vital signs: BP 130/70  Pulse 72  Ht 5\' 3"  (1.6 m)  Wt 154 lb 4 oz (69.967 kg)  BMI 27.33 kg/m2 General: Chronically ill-appearing. Wheelchair-bound. Well-developed, well-nourished, no acute distress HEENT: Sclerae are anicteric, conjunctiva pink. Oral mucosa intact Lungs: Clear Heart: Regular Abdomen: soft, obese, nontender, nondistended, no obvious ascites, no peritoneal signs, normal bowel sounds. No organomegaly. Extremities: No edema Neuro: Left upper and left lower extremity weakness Psychiatric: alert and oriented x3. Cooperative   ASSESSMENT:  #28. 62 year old female multiple significant medical problems sent today regarding screening colonoscopy. Initial screening colonoscopy in June of 2010 limited by poor preparation. Followup in 1 year as recommended did not occur. Significant change in her health status since that time. I do not feel that she is an appropriate candidate for optical colonoscopy given her comorbidities and physical limitations. I did discuss virtual colonoscopy is a reasonable option. After discussing the various  forms of colon cancer screening with the patient, and her daughter, the patient has elected to forego any evaluations and be followed expectantly. If she were to require colon evaluation or change her mind regarding the form of screening, then virtual colonoscopy would likely be the next best step.  She will return to the care of Dr. Clent Ridges

## 2013-07-03 ENCOUNTER — Other Ambulatory Visit: Payer: Self-pay

## 2013-07-03 ENCOUNTER — Other Ambulatory Visit: Payer: Self-pay | Admitting: Family Medicine

## 2013-07-03 DIAGNOSIS — Z1231 Encounter for screening mammogram for malignant neoplasm of breast: Secondary | ICD-10-CM

## 2013-07-29 ENCOUNTER — Ambulatory Visit: Payer: Medicare Other

## 2013-08-24 ENCOUNTER — Ambulatory Visit
Admission: RE | Admit: 2013-08-24 | Discharge: 2013-08-24 | Disposition: A | Discharge status: Home / Self Care | Payer: Medicare Other | Source: Ambulatory Visit

## 2013-08-24 DIAGNOSIS — Z1231 Encounter for screening mammogram for malignant neoplasm of breast: Secondary | ICD-10-CM

## 2013-09-16 ENCOUNTER — Ambulatory Visit: Payer: Self-pay | Admitting: Podiatry

## 2013-09-17 ENCOUNTER — Telehealth: Payer: Self-pay | Admitting: Family Medicine

## 2013-09-17 NOTE — Telephone Encounter (Signed)
Pt's daughter calling to confirm fax was received from [facility].  Dr. Clent Ridges should have received a fax yesterday requesting medical clearance for the patient to have a full mouth extraction.

## 2013-09-18 NOTE — Telephone Encounter (Signed)
I left message with this information. 

## 2013-09-18 NOTE — Telephone Encounter (Signed)
Yes we got the form and it will be ready by Monday

## 2013-10-19 ENCOUNTER — Ambulatory Visit (INDEPENDENT_AMBULATORY_CARE_PROVIDER_SITE_OTHER): Payer: Medicaid Other | Admitting: Podiatry

## 2013-10-19 ENCOUNTER — Encounter: Payer: Self-pay | Admitting: Podiatry

## 2013-10-19 VITALS — BP 147/87 | HR 74 | Resp 12

## 2013-10-19 DIAGNOSIS — M79609 Pain in unspecified limb: Secondary | ICD-10-CM

## 2013-10-19 DIAGNOSIS — B351 Tinea unguium: Secondary | ICD-10-CM

## 2013-10-19 NOTE — Progress Notes (Signed)
   Subjective:    Patient ID: Valerie Rodriguez, female    DOB: 12/07/1950, 63 y.o.   MRN: [id number]  HPI Comments: '' TOENAILS TRIM.''  This patient presents for ongoing debridement of painful mycotic toenails. She's been a patient in this practice since June 2014. The last visit for this service was 06/24/2013    Review of Systems     Objective:   Physical Exam Dermatological: Brittle, discolored, hypertrophic, discolored toenails x10       Assessment & Plan:   Assessment: Previously diagnosied  diabetic with peripheral arterial disease  Previously diagnosed gait disturbance Symptomatic onychomycoses x10  Plan: All 10 toenails debrided without a bleeding. Reappoint at three-month intervals recommended

## 2013-12-31 ENCOUNTER — Encounter: Payer: Self-pay | Admitting: Family Medicine

## 2013-12-31 ENCOUNTER — Ambulatory Visit (INDEPENDENT_AMBULATORY_CARE_PROVIDER_SITE_OTHER): Payer: Medicare Other | Admitting: Family Medicine

## 2013-12-31 ENCOUNTER — Telehealth: Payer: Self-pay | Admitting: Family Medicine

## 2013-12-31 VITALS — BP 146/82 | HR 73 | Temp 98.0°F

## 2013-12-31 DIAGNOSIS — Z0000 Encounter for general adult medical examination without abnormal findings: Secondary | ICD-10-CM

## 2013-12-31 MED ORDER — PREGABALIN 200 MG PO CAPS: 200.0000 mg | ORAL_CAPSULE | Freq: Two times a day (BID) | ORAL | Status: DC

## 2013-12-31 MED ORDER — ATENOLOL 50 MG PO TABS: 50.0000 mg | ORAL_TABLET | Freq: Every day | ORAL | Status: DC

## 2013-12-31 MED ORDER — IPRATROPIUM-ALBUTEROL 18-103 MCG/ACT IN AERO: 2.0000 | INHALATION_SPRAY | Freq: Four times a day (QID) | RESPIRATORY_TRACT | Status: DC

## 2013-12-31 MED ORDER — PANTOPRAZOLE SODIUM 40 MG PO TBEC: 40.0000 mg | DELAYED_RELEASE_TABLET | Freq: Every day | ORAL | Status: DC

## 2013-12-31 MED ORDER — FLUTICASONE PROPIONATE 50 MCG/ACT NA SUSP: 2.0000 | Freq: Every day | NASAL | Status: DC

## 2013-12-31 MED ORDER — HYDRALAZINE HCL 50 MG PO TABS: 50.0000 mg | ORAL_TABLET | Freq: Two times a day (BID) | ORAL | Status: DC

## 2013-12-31 MED ORDER — FUROSEMIDE 20 MG PO TABS: 20.0000 mg | ORAL_TABLET | Freq: Every day | ORAL | Status: DC

## 2013-12-31 MED ORDER — CITALOPRAM HYDROBROMIDE 20 MG PO TABS: 10.0000 mg | ORAL_TABLET | Freq: Every day | ORAL | Status: DC

## 2013-12-31 MED ORDER — BUDESONIDE-FORMOTEROL FUMARATE 160-4.5 MCG/ACT IN AERO: 2.0000 | INHALATION_SPRAY | Freq: Two times a day (BID) | RESPIRATORY_TRACT | Status: DC

## 2013-12-31 MED ORDER — CLOPIDOGREL BISULFATE 75 MG PO TABS: 75.0000 mg | ORAL_TABLET | Freq: Every day | ORAL | Status: DC

## 2013-12-31 MED ORDER — MONTELUKAST SODIUM 10 MG PO TABS: 10.0000 mg | ORAL_TABLET | Freq: Every day | ORAL | Status: DC

## 2013-12-31 MED ORDER — AZELASTINE HCL 0.1 % NA SOLN: 1.0000 | Freq: Two times a day (BID) | NASAL | Status: DC

## 2013-12-31 MED ORDER — ATORVASTATIN CALCIUM 80 MG PO TABS: 80.0000 mg | ORAL_TABLET | Freq: Every day | ORAL | Status: DC

## 2013-12-31 MED ORDER — INSULIN ASPART 100 UNIT/ML ~~LOC~~ SOLN: 15.0000 [IU] | Freq: Three times a day (TID) | SUBCUTANEOUS | Status: DC

## 2013-12-31 NOTE — Progress Notes (Signed)
Pre visit review using our clinic review tool, if applicable. No additional management support is needed unless otherwise documented below in the visit note. 

## 2013-12-31 NOTE — Progress Notes (Signed)
   Subjective:    Patient ID: Valerie HumphreyShirley R Upshaw, female    DOB: 12/31/1950, 63 y.o.   MRN: [id number]  HPI 63 yr old female for a cpx. She seems to be doing well er her daughter. She does complain of burning and swelling in her feet. For some reason her nursing facility stopped giving her Lyrica last winter. She denies any SOB and she uses her inhalers daily. She does not know how her glucoses have been running.    Review of Systems  Constitutional: Negative.   HENT: Negative.   Eyes: Negative.   Respiratory: Negative.   Cardiovascular: Positive for leg swelling. Negative for chest pain and palpitations.  Gastrointestinal: Negative.   Genitourinary: Negative for dysuria, urgency, frequency, hematuria, flank pain, decreased urine volume, enuresis, difficulty urinating, pelvic pain and dyspareunia.  Musculoskeletal: Negative.   Skin: Negative.   Neurological: Negative.   Psychiatric/Behavioral: Negative.        Objective:   Physical Exam  Constitutional: She is oriented to person, place, and time. She appears well-developed and well-nourished. No distress.  HENT:  Head: Normocephalic and atraumatic.  Right Ear: External ear normal.  Left Ear: External ear normal.  Nose: Nose normal.  Mouth/Throat: Oropharynx is clear and moist. No oropharyngeal exudate.  Eyes: Conjunctivae and EOM are normal. Pupils are equal, round, and reactive to light. No scleral icterus.  Neck: Normal range of motion. Neck supple. No JVD present. No thyromegaly present.  Cardiovascular: Normal rate, regular rhythm, normal heart sounds and intact distal pulses.  Exam reveals no gallop and no friction rub.   No murmur heard. Pulmonary/Chest: Effort normal and breath sounds normal. No respiratory distress. She has no rales. She exhibits no tenderness.  Scattered wheezes   Abdominal: Soft. Bowel sounds are normal. She exhibits no distension and no mass. There is no tenderness. There is no rebound and no guarding.    Musculoskeletal: Normal range of motion. She exhibits no tenderness.  2+ edema in both feet   Lymphadenopathy:    She has no cervical adenopathy.  Neurological: She is alert and oriented to person, place, and time. She has normal reflexes. No cranial nerve deficit. She exhibits normal muscle tone. Coordination normal.  Skin: Skin is warm and dry. No rash noted. No erythema.  Psychiatric: She has a normal mood and affect. Her behavior is normal. Judgment and thought content normal.          Assessment & Plan:  Well exam. We will make sure she is getting her Lyrica. Add Lasix for the edema. She will have fasting labs drawn back at her facility.

## 2013-12-31 NOTE — Telephone Encounter (Signed)
After pt's cpe today,all her meds were sent to [facility]. [facility] needs all her meds sent to them at their pharm instead. [facility] [place]:  [phone] There were 2 new meds: lyrica and lasix. Both these needs [facility] [phone].  Along w[facility]

## 2014-01-01 ENCOUNTER — Telehealth: Payer: Self-pay

## 2014-01-01 MED ORDER — PREGABALIN 200 MG PO CAPS: 200.0000 mg | ORAL_CAPSULE | Freq: Two times a day (BID) | ORAL | Status: DC

## 2014-01-01 MED ORDER — FUROSEMIDE 20 MG PO TABS: 20.0000 mg | ORAL_TABLET | Freq: Every day | ORAL | Status: DC

## 2014-01-01 NOTE — Telephone Encounter (Signed)
Please take care of this.  

## 2014-01-01 NOTE — Telephone Encounter (Signed)
[facility]  [facility] called about prescriptions for pt Valerie Rodriguez. French Anaracy said pt prescriptions need to be sent to there pharmacy and not [facility] as thery were... [facility] pharmacy number is [phone] and the pharmacy name is [facility]

## 2014-01-01 NOTE — Telephone Encounter (Signed)
Rx faxed to [facility].

## 2014-01-06 MED ORDER — CITALOPRAM HYDROBROMIDE 20 MG PO TABS: 10.0000 mg | ORAL_TABLET | Freq: Every day | ORAL | Status: DC

## 2014-01-06 MED ORDER — AZELASTINE HCL 0.1 % NA SOLN: 1.0000 | Freq: Two times a day (BID) | NASAL | Status: AC

## 2014-01-06 MED ORDER — ATENOLOL 50 MG PO TABS: 50.0000 mg | ORAL_TABLET | Freq: Every day | ORAL | Status: DC

## 2014-01-06 MED ORDER — BUDESONIDE-FORMOTEROL FUMARATE 160-4.5 MCG/ACT IN AERO: 2.0000 | INHALATION_SPRAY | Freq: Two times a day (BID) | RESPIRATORY_TRACT | Status: AC

## 2014-01-06 MED ORDER — PANTOPRAZOLE SODIUM 40 MG PO TBEC: 40.0000 mg | DELAYED_RELEASE_TABLET | Freq: Every day | ORAL | Status: DC

## 2014-01-06 MED ORDER — INSULIN ASPART 100 UNIT/ML ~~LOC~~ SOLN: 15.0000 [IU] | Freq: Three times a day (TID) | SUBCUTANEOUS | Status: DC

## 2014-01-06 MED ORDER — CLOPIDOGREL BISULFATE 75 MG PO TABS: 75.0000 mg | ORAL_TABLET | Freq: Every day | ORAL | Status: DC

## 2014-01-06 MED ORDER — IPRATROPIUM-ALBUTEROL 18-103 MCG/ACT IN AERO: 2.0000 | INHALATION_SPRAY | Freq: Four times a day (QID) | RESPIRATORY_TRACT | Status: DC

## 2014-01-06 MED ORDER — FLUTICASONE PROPIONATE 50 MCG/ACT NA SUSP: 2.0000 | Freq: Every day | NASAL | Status: AC

## 2014-01-06 MED ORDER — PREGABALIN 200 MG PO CAPS: 200.0000 mg | ORAL_CAPSULE | Freq: Two times a day (BID) | ORAL | Status: DC

## 2014-01-06 MED ORDER — HYDRALAZINE HCL 50 MG PO TABS: 50.0000 mg | ORAL_TABLET | Freq: Two times a day (BID) | ORAL | Status: DC

## 2014-01-06 MED ORDER — ATORVASTATIN CALCIUM 80 MG PO TABS: 80.0000 mg | ORAL_TABLET | Freq: Every day | ORAL | Status: DC

## 2014-01-06 MED ORDER — MONTELUKAST SODIUM 10 MG PO TABS: 10.0000 mg | ORAL_TABLET | Freq: Every day | ORAL | Status: DC

## 2014-01-06 NOTE — Telephone Encounter (Signed)
I resent 12 scripts [facility] to [facility], script for Lyrica was printed and faxed.

## 2014-01-18 ENCOUNTER — Ambulatory Visit (INDEPENDENT_AMBULATORY_CARE_PROVIDER_SITE_OTHER): Payer: PRIVATE HEALTH INSURANCE | Admitting: Podiatry

## 2014-01-18 ENCOUNTER — Encounter: Payer: Self-pay | Admitting: Podiatry

## 2014-01-18 VITALS — BP 142/70 | HR 86 | Resp 12

## 2014-01-18 DIAGNOSIS — B351 Tinea unguium: Secondary | ICD-10-CM

## 2014-01-18 DIAGNOSIS — M79609 Pain in unspecified limb: Secondary | ICD-10-CM

## 2014-01-18 NOTE — Patient Instructions (Signed)
Diabetes and Foot Care Diabetes may cause you to have problems because of poor blood supply (circulation) to your feet and legs. This may cause the skin on your feet to become thinner, break easier, and heal more slowly. Your skin may become dry, and the skin may peel and crack. You may also have nerve damage in your legs and feet causing decreased feeling in them. You may not notice minor injuries to your feet that could lead to infections or more serious problems. Taking care of your feet is one of the most important things you can do for yourself.  HOME CARE INSTRUCTIONS  Wear shoes at all times, even in the house. Do not go barefoot. Bare feet are easily injured.  Check your feet daily for blisters, cuts, and redness. If you cannot see the bottom of your feet, use a mirror or ask someone for help.  Wash your feet with warm water (do not use hot water) and mild soap. Then pat your feet and the areas between your toes until they are completely dry. Do not soak your feet as this can dry your skin.  Apply a moisturizing lotion or petroleum jelly (that does not contain alcohol and is unscented) to the skin on your feet and to dry, brittle toenails. Do not apply lotion between your toes.  Trim your toenails straight across. Do not dig under them or around the cuticle. File the edges of your nails with an emery board or nail file.  Do not cut corns or calluses or try to remove them with medicine.  Wear clean socks or stockings every day. Make sure they are not too tight. Do not wear knee-high stockings since they may decrease blood flow to your legs.  Wear shoes that fit properly and have enough cushioning. To break in new shoes, wear them for just a few hours a day. This prevents you from injuring your feet. Always look in your shoes before you put them on to be sure there are no objects inside.  Do not cross your legs. This may decrease the blood flow to your feet.  If you find a minor scrape,  cut, or break in the skin on your feet, keep it and the skin around it clean and dry. These areas may be cleansed with mild soap and water. Do not cleanse the area with peroxide, alcohol, or iodine.  When you remove an adhesive bandage, be sure not to damage the skin around it.  If you have a wound, look at it several times a day to make sure it is healing.  Do not use heating pads or hot water bottles. They may burn your skin. If you have lost feeling in your feet or legs, you may not know it is happening until it is too late.  Make sure your health care provider performs a complete foot exam at least annually or more often if you have foot problems. Report any cuts, sores, or bruises to your health care provider immediately. SEEK MEDICAL CARE IF:   You have an injury that is not healing.  You have cuts or breaks in the skin.  You have an ingrown nail.  You notice redness on your legs or feet.  You feel burning or tingling in your legs or feet.  You have pain or cramps in your legs and feet.  Your legs or feet are numb.  Your feet always feel cold. SEEK IMMEDIATE MEDICAL CARE IF:   There is increasing redness,   swelling, or pain in or around a wound.  There is a red line that goes up your leg.  Pus is coming from a wound.  You develop a fever or as directed by your health care provider.  You notice a bad smell coming from an ulcer or wound. Document Released: 09/28/2000 Document Revised: 06/03/2013 Document Reviewed: 03/10/2013 ExitCare Patient Information 2014 [employer].  

## 2014-01-19 NOTE — Progress Notes (Signed)
Patient ID: Winferd HumphreyShirley R Carbone, female   DOB: 11/15/1950, 63 y.o.   MRN: [id number]  Subjective: This 63 year old female presents with her interpreter  seated in a wheelchair and unable to transfer into treatment chair.  Objective: Elongated, incurvated, discolored, toenails x10  Assessment: Symptomatic onychomycoses x10  Plan: Nails x10 are debrided without a bleeding. Reappoint at three-month intervals

## 2014-04-21 ENCOUNTER — Ambulatory Visit (INDEPENDENT_AMBULATORY_CARE_PROVIDER_SITE_OTHER): Payer: Medicare Other | Admitting: Podiatry

## 2014-04-21 DIAGNOSIS — B351 Tinea unguium: Secondary | ICD-10-CM

## 2014-04-21 DIAGNOSIS — M79609 Pain in unspecified limb: Secondary | ICD-10-CM

## 2014-04-21 DIAGNOSIS — M79673 Pain in unspecified foot: Secondary | ICD-10-CM

## 2014-04-21 NOTE — Patient Instructions (Addendum)
Remove dressing and third right toe in 48 hours Apply topical antibiotic ointment daily to the third right toe, cover with a Band-Aid until a scab forms

## 2014-04-21 NOTE — Progress Notes (Signed)
   Subjective:    Patient ID: Valerie HumphreyShirley R Rabe, female    DOB: 03/13/1951, 63 y.o.   MRN: [id number]  HPI Pt presents for routine debridement This patient who is deaf presents with daughter and interpreter. The patient is complaining of uncomfortable toenails and close shoes  Review of Systems     Objective:   Physical Exam  63 year old black female is seated in a wheelchair and unable to transfer to treatment chair  Dermatological: Hypertrophic, elongated, incurvated, discolored toenails 6-10      Assessment & Plan:   Assessment: Symptomatic onychomycoses 6-10  Plan: Nails x10 are debrided Pinpoint bleeding noted distal third right toe. This area was dressed with antibiotic ointment and gauze dressing.   Written instructions for followup care includes removal of bandage on third right toe in 48 hours and daily application of topical antibiotic ointment to the distal third right toe covering with a Band-Aid until a scab forms

## 2014-04-23 ENCOUNTER — Telehealth: Payer: Self-pay | Admitting: Family Medicine

## 2014-04-23 NOTE — Telephone Encounter (Signed)
lmom for pt to cb. Pt needs diabetic bundle alc and ldl. Pt also needs follow on blood pressure

## 2014-05-03 ENCOUNTER — Other Ambulatory Visit: Payer: Self-pay | Admitting: Family Medicine

## 2014-05-21 NOTE — Telephone Encounter (Signed)
Spoke with daughter and patient is now in a skilled nursing facility and the doctor there is treating her.

## 2014-07-15 ENCOUNTER — Other Ambulatory Visit: Payer: Self-pay

## 2014-07-15 DIAGNOSIS — Z1231 Encounter for screening mammogram for malignant neoplasm of breast: Secondary | ICD-10-CM

## 2014-07-21 ENCOUNTER — Ambulatory Visit: Payer: Medicare Other | Admitting: Podiatry

## 2014-08-25 ENCOUNTER — Ambulatory Visit
Admission: RE | Admit: 2014-08-25 | Discharge: 2014-08-25 | Disposition: A | Discharge status: Home / Self Care | Payer: PRIVATE HEALTH INSURANCE | Source: Ambulatory Visit

## 2014-08-25 ENCOUNTER — Encounter (INDEPENDENT_AMBULATORY_CARE_PROVIDER_SITE_OTHER): Payer: Self-pay

## 2014-08-25 DIAGNOSIS — Z1231 Encounter for screening mammogram for malignant neoplasm of breast: Secondary | ICD-10-CM

## 2014-09-20 ENCOUNTER — Ambulatory Visit (INDEPENDENT_AMBULATORY_CARE_PROVIDER_SITE_OTHER): Payer: Medicare Other | Admitting: Podiatry

## 2014-09-20 ENCOUNTER — Encounter: Payer: Self-pay | Admitting: Podiatry

## 2014-09-20 DIAGNOSIS — B351 Tinea unguium: Secondary | ICD-10-CM

## 2014-09-20 DIAGNOSIS — M79676 Pain in unspecified toe(s): Secondary | ICD-10-CM

## 2014-09-20 NOTE — Patient Instructions (Signed)
Have representative from medical facility at the time of appointment  Return every 3 months for debridement of mycotic toenails Patient was requesting diabetic shoes today, however, not able to evaluate at this visit

## 2014-09-21 NOTE — Progress Notes (Signed)
Patient ID: Valerie HumphreyShirley R Lincks, female   DOB: 01/14/1951, 63 y.o.   MRN: [id number]  Subjective: This patient presents with a deaf interpreter, however, no representative from her living facility She has a known diabetic and is requesting debridement of toenails  Objective: She is seated in a wheelchair and unable to transfer to treatment chair  The toenails are elongated, incurvated, discolored  and tender to palpation 6-10  Assessment: Symptomatic onychomycoses 6-10  Plan: Debrided toenails 10 without a bleeding Wrote note on the report of consultation requesting that a representative from the facility present to the office when patient presents for nail debridement.  Reappoint 3 months

## 2014-12-27 ENCOUNTER — Encounter: Payer: Self-pay | Admitting: Podiatry

## 2014-12-27 ENCOUNTER — Ambulatory Visit (INDEPENDENT_AMBULATORY_CARE_PROVIDER_SITE_OTHER): Payer: Medicare Other | Admitting: Podiatry

## 2014-12-27 DIAGNOSIS — M79676 Pain in unspecified toe(s): Secondary | ICD-10-CM | POA: Diagnosis not present

## 2014-12-27 DIAGNOSIS — B351 Tinea unguium: Secondary | ICD-10-CM | POA: Diagnosis not present

## 2014-12-27 NOTE — Patient Instructions (Signed)
Diabetes and Foot Care Diabetes may cause you to have problems because of poor blood supply (circulation) to your feet and legs. This may cause the skin on your feet to become thinner, break easier, and heal more slowly. Your skin may become dry, and the skin may peel and crack. You may also have nerve damage in your legs and feet causing decreased feeling in them. You may not notice minor injuries to your feet that could lead to infections or more serious problems. Taking care of your feet is one of the most important things you can do for yourself.  HOME CARE INSTRUCTIONS  Wear shoes at all times, even in the house. Do not go barefoot. Bare feet are easily injured.  Check your feet daily for blisters, cuts, and redness. If you cannot see the bottom of your feet, use a mirror or ask someone for help.  Wash your feet with warm water (do not use hot water) and mild soap. Then pat your feet and the areas between your toes until they are completely dry. Do not soak your feet as this can dry your skin.  Apply a moisturizing lotion or petroleum jelly (that does not contain alcohol and is unscented) to the skin on your feet and to dry, brittle toenails. Do not apply lotion between your toes.  Trim your toenails straight across. Do not dig under them or around the cuticle. File the edges of your nails with an emery board or nail file.  Do not cut corns or calluses or try to remove them with medicine.  Wear clean socks or stockings every day. Make sure they are not too tight. Do not wear knee-high stockings since they may decrease blood flow to your legs.  Wear shoes that fit properly and have enough cushioning. To break in new shoes, wear them for just a few hours a day. This prevents you from injuring your feet. Always look in your shoes before you put them on to be sure there are no objects inside.  Do not cross your legs. This may decrease the blood flow to your feet.  If you find a minor scrape,  cut, or break in the skin on your feet, keep it and the skin around it clean and dry. These areas may be cleansed with mild soap and water. Do not cleanse the area with peroxide, alcohol, or iodine.  When you remove an adhesive bandage, be sure not to damage the skin around it.  If you have a wound, look at it several times a day to make sure it is healing.  Do not use heating pads or hot water bottles. They may burn your skin. If you have lost feeling in your feet or legs, you may not know it is happening until it is too late.  Make sure your health care provider performs a complete foot exam at least annually or more often if you have foot problems. Report any cuts, sores, or bruises to your health care provider immediately. SEEK MEDICAL CARE IF:   You have an injury that is not healing.  You have cuts or breaks in the skin.  You have an ingrown nail.  You notice redness on your legs or feet.  You feel burning or tingling in your legs or feet.  You have pain or cramps in your legs and feet.  Your legs or feet are numb.  Your feet always feel cold. SEEK IMMEDIATE MEDICAL CARE IF:   There is increasing redness,   swelling, or pain in or around a wound.  There is a red line that goes up your leg.  Pus is coming from a wound.  You develop a fever or as directed by your health care provider.  You notice a bad smell coming from an ulcer or wound. Document Released: 09/28/2000 Document Revised: 06/03/2013 Document Reviewed: 03/10/2013 ExitCare Patient Information 2015 [employer]. This information is not intended to replace advice given to you by your health care provider. Make sure you discuss any questions you have with your health care provider.  

## 2014-12-27 NOTE — Progress Notes (Signed)
Patient ID: Valerie HumphreyShirley R Trang, female   DOB: 08/03/1951, 64 y.o.   MRN: [id number]  Subjective: Patient presents with a definite interpreter and a representative from her assisted living facility and the representative is requesting toenail debridement.  Objective: Patient is able to transfer with assistance from wheelchair to treatment chair The toenails are discolored, incurvated, brittle and tender to palpation 6-10  Assessment: Type II diabetic Deafness Symptomatic onychomycoses 6-10  Plan: Debridement toenails 10 without a bleeding  Reappoint 3 months

## 2015-03-20 ENCOUNTER — Inpatient Hospital Stay (HOSPITAL_COMMUNITY)
Admission: EM | Admit: 2015-03-20 | Discharge: 2015-03-26 | DRG: 871 | Disposition: A | Discharge status: Skilled Nursing Facility | Payer: Medicare Other | Attending: Internal Medicine | Admitting: Internal Medicine

## 2015-03-20 ENCOUNTER — Emergency Department (HOSPITAL_COMMUNITY): Payer: Medicare Other

## 2015-03-20 ENCOUNTER — Encounter (HOSPITAL_COMMUNITY): Payer: Self-pay | Admitting: Emergency Medicine

## 2015-03-20 DIAGNOSIS — N179 Acute kidney failure, unspecified: Secondary | ICD-10-CM | POA: Diagnosis present

## 2015-03-20 DIAGNOSIS — E785 Hyperlipidemia, unspecified: Secondary | ICD-10-CM | POA: Diagnosis present

## 2015-03-20 DIAGNOSIS — Z87891 Personal history of nicotine dependence: Secondary | ICD-10-CM

## 2015-03-20 DIAGNOSIS — H9190 Unspecified hearing loss, unspecified ear: Secondary | ICD-10-CM

## 2015-03-20 DIAGNOSIS — R042 Hemoptysis: Secondary | ICD-10-CM

## 2015-03-20 DIAGNOSIS — A419 Sepsis, unspecified organism: Principal | ICD-10-CM | POA: Diagnosis present

## 2015-03-20 DIAGNOSIS — R1310 Dysphagia, unspecified: Secondary | ICD-10-CM

## 2015-03-20 DIAGNOSIS — G9340 Encephalopathy, unspecified: Secondary | ICD-10-CM | POA: Diagnosis present

## 2015-03-20 DIAGNOSIS — E860 Dehydration: Secondary | ICD-10-CM | POA: Diagnosis present

## 2015-03-20 DIAGNOSIS — R0602 Shortness of breath: Secondary | ICD-10-CM

## 2015-03-20 DIAGNOSIS — Z90710 Acquired absence of both cervix and uterus: Secondary | ICD-10-CM

## 2015-03-20 DIAGNOSIS — E119 Type 2 diabetes mellitus without complications: Secondary | ICD-10-CM

## 2015-03-20 DIAGNOSIS — R4701 Aphasia: Secondary | ICD-10-CM | POA: Diagnosis present

## 2015-03-20 DIAGNOSIS — R5381 Other malaise: Secondary | ICD-10-CM | POA: Diagnosis present

## 2015-03-20 DIAGNOSIS — E8770 Fluid overload, unspecified: Secondary | ICD-10-CM | POA: Diagnosis present

## 2015-03-20 DIAGNOSIS — R1312 Dysphagia, oropharyngeal phase: Secondary | ICD-10-CM | POA: Diagnosis present

## 2015-03-20 DIAGNOSIS — I129 Hypertensive chronic kidney disease with stage 1 through stage 4 chronic kidney disease, or unspecified chronic kidney disease: Secondary | ICD-10-CM | POA: Diagnosis present

## 2015-03-20 DIAGNOSIS — B9620 Unspecified Escherichia coli [E. coli] as the cause of diseases classified elsewhere: Secondary | ICD-10-CM | POA: Diagnosis present

## 2015-03-20 DIAGNOSIS — J181 Lobar pneumonia, unspecified organism: Secondary | ICD-10-CM | POA: Diagnosis present

## 2015-03-20 DIAGNOSIS — Z8673 Personal history of transient ischemic attack (TIA), and cerebral infarction without residual deficits: Secondary | ICD-10-CM

## 2015-03-20 DIAGNOSIS — Z9049 Acquired absence of other specified parts of digestive tract: Secondary | ICD-10-CM | POA: Diagnosis present

## 2015-03-20 DIAGNOSIS — H409 Unspecified glaucoma: Secondary | ICD-10-CM | POA: Diagnosis present

## 2015-03-20 DIAGNOSIS — N390 Urinary tract infection, site not specified: Secondary | ICD-10-CM | POA: Diagnosis present

## 2015-03-20 DIAGNOSIS — I739 Peripheral vascular disease, unspecified: Secondary | ICD-10-CM | POA: Diagnosis present

## 2015-03-20 DIAGNOSIS — R4182 Altered mental status, unspecified: Secondary | ICD-10-CM | POA: Diagnosis not present

## 2015-03-20 DIAGNOSIS — J45909 Unspecified asthma, uncomplicated: Secondary | ICD-10-CM | POA: Diagnosis present

## 2015-03-20 DIAGNOSIS — Z794 Long term (current) use of insulin: Secondary | ICD-10-CM

## 2015-03-20 DIAGNOSIS — J189 Pneumonia, unspecified organism: Secondary | ICD-10-CM | POA: Diagnosis present

## 2015-03-20 DIAGNOSIS — I69391 Dysphagia following cerebral infarction: Secondary | ICD-10-CM

## 2015-03-20 DIAGNOSIS — N189 Chronic kidney disease, unspecified: Secondary | ICD-10-CM | POA: Diagnosis present

## 2015-03-20 DIAGNOSIS — L8990 Pressure ulcer of unspecified site, unspecified stage: Secondary | ICD-10-CM | POA: Insufficient documentation

## 2015-03-20 LAB — CBC WITH DIFFERENTIAL/PLATELET
BASOS PCT: 0 % (ref 0–1)
Basophils Absolute: 0 10*3/uL (ref 0.0–0.1)
EOS ABS: 0.1 10*3/uL (ref 0.0–0.7)
Eosinophils Relative: 1 % (ref 0–5)
HEMATOCRIT: 42.6 % (ref 36.0–46.0)
HEMOGLOBIN: 14 g/dL (ref 12.0–15.0)
LYMPHS ABS: 0.7 10*3/uL (ref 0.7–4.0)
LYMPHS PCT: 9 % — AB (ref 12–46)
MCH: 26.2 pg (ref 26.0–34.0)
MCHC: 32.9 g/dL (ref 30.0–36.0)
MCV: 79.6 fL (ref 78.0–100.0)
MONOS PCT: 5 % (ref 3–12)
Monocytes Absolute: 0.4 10*3/uL (ref 0.1–1.0)
NEUTROS ABS: 6.4 10*3/uL (ref 1.7–7.7)
Neutrophils Relative %: 85 % — ABNORMAL HIGH (ref 43–77)
PLATELETS: 215 10*3/uL (ref 150–400)
RBC: 5.35 MIL/uL — ABNORMAL HIGH (ref 3.87–5.11)
RDW: 15.6 % — AB (ref 11.5–15.5)
WBC: 7.6 10*3/uL (ref 4.0–10.5)

## 2015-03-20 LAB — I-STAT CG4 LACTIC ACID, ED: LACTIC ACID, VENOUS: 2.69 mmol/L — AB (ref 0.5–2.0)

## 2015-03-20 LAB — COMPREHENSIVE METABOLIC PANEL
ALT: 20 U/L (ref 14–54)
ANION GAP: 11 (ref 5–15)
AST: 27 U/L (ref 15–41)
Albumin: 3.2 g/dL — ABNORMAL LOW (ref 3.5–5.0)
Alkaline Phosphatase: 81 U/L (ref 38–126)
BILIRUBIN TOTAL: 0.7 mg/dL (ref 0.3–1.2)
BUN: 18 mg/dL (ref 6–20)
CHLORIDE: 99 mmol/L — AB (ref 101–111)
CO2: 27 mmol/L (ref 22–32)
Calcium: 9.2 mg/dL (ref 8.9–10.3)
Creatinine, Ser: 1.34 mg/dL — ABNORMAL HIGH (ref 0.44–1.00)
GFR calc non Af Amer: 41 mL/min — ABNORMAL LOW (ref >60)
GFR, EST AFRICAN AMERICAN: 48 mL/min — AB (ref >60)
GLUCOSE: 201 mg/dL — AB (ref 65–99)
Potassium: 4.5 mmol/L (ref 3.5–5.1)
Sodium: 137 mmol/L (ref 135–145)
Total Protein: 7.6 g/dL (ref 6.5–8.1)

## 2015-03-20 LAB — URINALYSIS, ROUTINE W REFLEX MICROSCOPIC
Bilirubin Urine: NEGATIVE
Glucose, UA: NEGATIVE mg/dL
Hgb urine dipstick: NEGATIVE
Ketones, ur: NEGATIVE mg/dL
Nitrite: NEGATIVE
PROTEIN: 100 mg/dL — AB
SPECIFIC GRAVITY, URINE: 1.009 (ref 1.005–1.030)
UROBILINOGEN UA: 1 mg/dL (ref 0.0–1.0)
pH: 6.5 (ref 5.0–8.0)

## 2015-03-20 LAB — URINE MICROSCOPIC-ADD ON

## 2015-03-20 LAB — I-STAT TROPONIN, ED: Troponin i, poc: 0.02 ng/mL (ref 0.00–0.08)

## 2015-03-20 MED ORDER — ACETAMINOPHEN 650 MG RE SUPP
650.0000 mg | Freq: Once | RECTAL | Status: AC
  Administered 2015-03-20: 650 mg via RECTAL
  Filled 2015-03-20: qty 1

## 2015-03-20 MED ORDER — VANCOMYCIN HCL IN DEXTROSE 1-5 GM/200ML-% IV SOLN
1000.0000 mg | Freq: Once | INTRAVENOUS | Status: AC
  Administered 2015-03-21: 1000 mg via INTRAVENOUS
  Filled 2015-03-20: qty 200

## 2015-03-20 MED ORDER — SODIUM CHLORIDE 0.9 % IV BOLUS (SEPSIS)
1000.0000 mL | Freq: Once | INTRAVENOUS | Status: AC
  Administered 2015-03-20: 1000 mL via INTRAVENOUS

## 2015-03-20 MED ORDER — PIPERACILLIN-TAZOBACTAM 3.375 G IVPB 30 MIN
3.3750 g | Freq: Once | INTRAVENOUS | Status: AC
  Administered 2015-03-20: 3.375 g via INTRAVENOUS
  Filled 2015-03-20: qty 50

## 2015-03-20 NOTE — ED Provider Notes (Signed)
CSN: [id number]     Arrival date & time 03/20/15  2227 History   First MD Initiated Contact with Patient 03/20/15 2233     Chief Complaint  Patient presents with  . Altered Mental Status     (Consider location/radiation/quality/duration/timing/severity/associated sxs/prior Treatment) The history is provided by the nursing home and the EMS personnel.  Valerie Rodriguez is a 64 y.o. female history of hypertension, hyperlipidemia, deaf using sign language at baseline, previous stroke, who presents with altered mental status. Patient was in the nursing home and is able to sign at baseline. Patient was noted to be altered today. Patient was less responsive than usual. Also has some shortness of breath and nursing home reported oxygen 70% on room air. Patient arrived by EMS and CBG was 160.    Level V caveat- AMS   Past Medical History  Diagnosis Date  . Hypertension   . Hyperlipemia   . Deaf-mutism   . Glaucoma     Dr Eulah Pont  . Diabetes mellitus     Type II, sees Dr. Candie Chroman  . Allergy   . Asthma     sees Dr [place] Callas  . Stroke 11-05-10    sees Dr. Pearlean Brownie  . Chronic kidney disease     sees Dr. Camille Bal   . PVD (peripheral vascular disease)     sees Dr. Hart [place]   . PAD (peripheral artery disease)    Past Surgical History  Procedure Laterality Date  . Cholecystectomy    . Abdominal hysterectomy      oophorectomy 1976  . Rotator cuff repair  2004  . Colonoscopy  03-30-09    per Dr. Marina Goodell, poor prep, repeat one yr    Family History  Problem Relation Age of Onset  . Hypertension      family hx  . Coronary artery disease      female 1st degree relative  . Hyperlipidemia      family hx  . Diabetes      1st degree relative  . Diabetes Mother   . Heart disease Mother   . Hypertension Mother   . Hyperlipidemia Mother    History  Substance Use Topics  . Smoking status: Former Smoker    Types: Cigarettes    Quit date: 12/21/1979  . Smokeless tobacco: Never Used  .  Alcohol Use: No   OB History    No data available     Review of Systems  Unable to perform ROS: Mental status change      Allergies  Review of patient's allergies indicates no known allergies.  Home Medications   Prior to Admission medications   Medication Sig Start Date End Date Taking? Authorizing Provider  albuterol-ipratropium (COMBIVENT) 18-103 MCG/ACT inhaler Inhale 2 puffs into the lungs 4 (four) times daily. 01/06/14   Nelwyn Salisbury, MD  atenolol (TENORMIN) 50 MG tablet Take 1 tablet (50 mg total) by mouth daily. 01/06/14   Nelwyn Salisbury, MD  atorvastatin (LIPITOR) 80 MG tablet Take 1 tablet (80 mg total) by mouth daily. 01/06/14   Nelwyn Salisbury, MD  azelastine (ASTELIN) 137 MCG/SPRAY nasal spray Place 1 spray into both nostrils 2 (two) times daily. Use in each nostril as directed 01/06/14   Nelwyn Salisbury, MD  budesonide-formoterol [facility]) 160-4.5 MCG/ACT inhaler Inhale 2 puffs into the lungs 2 (two) times daily. 01/06/14   Nelwyn Salisbury, MD  citalopram (CELEXA) 20 MG tablet Take 0.5 tablets (10 mg total) by mouth daily.  01/06/14   Nelwyn SalisburyStephen A Fry, MD  clopidogrel (PLAVIX) 75 MG tablet Take 1 tablet (75 mg total) by mouth daily. 01/06/14   Nelwyn SalisburyStephen A Fry, MD  enalapril (VASOTEC) 2.5 MG tablet  04/06/14   Historical Provider, MD  fluticasone (FLONASE) 50 MCG/ACT nasal spray Place 2 sprays into both nostrils daily. 01/06/14   Nelwyn SalisburyStephen A Fry, MD  furosemide (LASIX) 20 MG tablet Take 1 tablet (20 mg total) by mouth daily. 01/01/14   Nelwyn SalisburyStephen A Fry, MD  Glucose Blood (BAYER BREEZE 2 TEST) DISK 2 (two) times daily.      Historical Provider, MD  glucose blood test strip 1 each by Other route 4 (four) times daily. Use as instructed    Historical Provider, MD  hydrALAZINE (APRESOLINE) 50 MG tablet Take 1 tablet (50 mg total) by mouth 2 (two) times daily. 01/06/14   Nelwyn SalisburyStephen A Fry, MD  insulin aspart (NOVOLOG) 100 UNIT/ML injection Inject 15 Units into the skin 3 (three) times daily before meals.  01/06/14   Nelwyn SalisburyStephen A Fry, MD  insulin glargine (LANTUS) 100 UNIT/ML injection Inject 0.2 mLs (20 Units total) into the skin at bedtime. 01/14/13   Richarda OverlieNayana Abrol, MD  insulin glargine (LANTUS) 100 UNIT/ML injection Inject 0.25 mLs (25 Units total) into the skin at bedtime. 01/16/13   Richarda OverlieNayana Abrol, MD  latanoprost (XALATAN) 0.005 % ophthalmic solution Place 1 drop into both eyes at bedtime.      Historical Provider, MD  montelukast (SINGULAIR) 10 MG tablet Take 1 tablet (10 mg total) by mouth at bedtime. 01/06/14   Nelwyn SalisburyStephen A Fry, MD  NOVOLOG FLEXPEN 100 UNIT/ML FlexPen INJECT 15 UNITS INTO THE SKIN 3 (THREE) TIMES DAILY BEFORE MEALS. 05/03/14   Nelwyn SalisburyStephen A Fry, MD  pantoprazole (PROTONIX) 40 MG tablet Take 1 tablet (40 mg total) by mouth daily. 01/06/14   Nelwyn SalisburyStephen A Fry, MD  pregabalin (LYRICA) 200 MG capsule Take 1 capsule (200 mg total) by mouth 2 (two) times daily. 01/06/14   Nelwyn SalisburyStephen A Fry, MD  promethazine (PHENERGAN) 25 MG tablet Take 25 mg by mouth every 4 (four) hours as needed for nausea. 12/14/11   Nelwyn SalisburyStephen A Fry, MD  VENTOLIN HFA 108 (90 BASE) MCG/ACT inhaler  02/11/14   Historical Provider, MD   BP 169/94 mmHg  Pulse 101  Temp(Src) 100 F (37.8 C) (Oral)  Resp 18  SpO2 99% Physical Exam  Constitutional:  Altered, non verbal   HENT:  Head: Normocephalic.  MM slightly dry   Eyes: Conjunctivae are normal. Pupils are equal, round, and reactive to light.  Neck: Normal range of motion. Neck supple.  Cardiovascular: Regular rhythm and normal heart sounds.   Tachycardic   Pulmonary/Chest:  Slightly tachypneic, rhonchi bilaterally   Abdominal: Soft. Bowel sounds are normal. She exhibits no distension. There is no tenderness. There is no rebound.  Musculoskeletal: Normal range of motion.  1+ edema bilateral legs   Neurological:  Awake, not verbal. Moving all extremities   Skin: Skin is warm and dry.  Psychiatric:  Unable   Nursing note and vitals reviewed.   ED Course  Procedures (including  critical care time) Labs Review Labs Reviewed  CBC WITH DIFFERENTIAL/PLATELET - Abnormal; Notable for the following:    RBC 5.35 (*)    RDW 15.6 (*)    Neutrophils Relative % 85 (*)    Lymphocytes Relative 9 (*)    All other components within normal limits  COMPREHENSIVE METABOLIC PANEL - Abnormal; Notable for the following:  Chloride 99 (*)    Glucose, Bld 201 (*)    Creatinine, Ser 1.34 (*)    Albumin 3.2 (*)    GFR calc non Af Amer 41 (*)    GFR calc Af Amer 48 (*)    All other components within normal limits  URINALYSIS, ROUTINE W REFLEX MICROSCOPIC (NOT AT [facility]) - Abnormal; Notable for the following:    APPearance CLOUDY (*)    Protein, ur 100 (*)    Leukocytes, UA SMALL (*)    All other components within normal limits  URINE MICROSCOPIC-ADD ON - Abnormal; Notable for the following:    Bacteria, UA MANY (*)    All other components within normal limits  I-STAT CG4 LACTIC ACID, ED - Abnormal; Notable for the following:    Lactic Acid, Venous 2.69 (*)    All other components within normal limits  URINE CULTURE  CULTURE, BLOOD (ROUTINE X 2)  CULTURE, BLOOD (ROUTINE X 2)  I-STAT TROPOININ, ED    Imaging Review Dg Chest Port 1 View  03/20/2015   CLINICAL DATA:  Low oxygen saturation.  EXAM: PORTABLE CHEST - 1 VIEW  COMPARISON:  Chest radiograph 01/14/2013.  FINDINGS: Low lung volumes. Suspected cardiomegaly. Vascular congestion without overt failure. RIGHT lower lung zone opacity could represent pneumonia. Difficult to evaluate for pleural effusion. Vascular calcification. Osteopenia.  IMPRESSION: Suspected RIGHT lower lobe infiltrate.   Electronically Signed   By: Davonna Belling M.D.   On: 03/20/2015 23:43     EKG Interpretation   Date/Time:  Sunday March 20 2015 22:36:06 EDT Ventricular Rate:  103 PR Interval:  140 QRS Duration: 86 QT Interval:  337 QTC Calculation: 441 R Axis:   22 Text Interpretation:  Sinus tachycardia Low voltage, extremity and  precordial leads  Anteroseptal infarct, old Baseline wander in lead(s) V4  No significant change since last tracing Confirmed by Lashay Osborne  MD, Kittie Krizan  ([id number]) on 03/20/2015 10:39:07 PM      MDM   Final diagnoses:  Shortness of breath    SHAYLYN BAWA is a 64 y.o. female here with AMS. Also febrile, tachy. Likely sepsis, less likely stroke. Will do sepsis workup, CT head. Will admit.   12:21 AM Lactate 2.7. CXR showed pneumonia. Given vanc/zosyn. Will admit.    Richardean Canal, MD 03/21/15 Rich Fuchs

## 2015-03-20 NOTE — ED Notes (Signed)
Per [facility], pt from [facility], called out for low sats, and "not herself". Normal mental state is nonverbal, no talking, sometimes basic sign language. Per staff, pt was on 70% on RA, was put on 4L with simple face mask, eyes closed. EMS placed her on 12 liters, pt was alert and looking at EMS.

## 2015-03-20 NOTE — ED Notes (Signed)
Dr Silverio LayYao given a copy of lactic acid results 2.69

## 2015-03-21 DIAGNOSIS — R4182 Altered mental status, unspecified: Secondary | ICD-10-CM | POA: Diagnosis present

## 2015-03-21 DIAGNOSIS — H9190 Unspecified hearing loss, unspecified ear: Secondary | ICD-10-CM | POA: Diagnosis present

## 2015-03-21 DIAGNOSIS — J189 Pneumonia, unspecified organism: Secondary | ICD-10-CM | POA: Diagnosis not present

## 2015-03-21 DIAGNOSIS — Z8673 Personal history of transient ischemic attack (TIA), and cerebral infarction without residual deficits: Secondary | ICD-10-CM | POA: Diagnosis not present

## 2015-03-21 DIAGNOSIS — E785 Hyperlipidemia, unspecified: Secondary | ICD-10-CM | POA: Diagnosis present

## 2015-03-21 DIAGNOSIS — H9193 Unspecified hearing loss, bilateral: Secondary | ICD-10-CM | POA: Diagnosis not present

## 2015-03-21 DIAGNOSIS — N390 Urinary tract infection, site not specified: Secondary | ICD-10-CM | POA: Diagnosis present

## 2015-03-21 DIAGNOSIS — R1312 Dysphagia, oropharyngeal phase: Secondary | ICD-10-CM | POA: Diagnosis present

## 2015-03-21 DIAGNOSIS — N189 Chronic kidney disease, unspecified: Secondary | ICD-10-CM | POA: Diagnosis present

## 2015-03-21 DIAGNOSIS — I69391 Dysphagia following cerebral infarction: Secondary | ICD-10-CM | POA: Diagnosis not present

## 2015-03-21 DIAGNOSIS — B9620 Unspecified Escherichia coli [E. coli] as the cause of diseases classified elsewhere: Secondary | ICD-10-CM | POA: Diagnosis present

## 2015-03-21 DIAGNOSIS — E118 Type 2 diabetes mellitus with unspecified complications: Secondary | ICD-10-CM | POA: Diagnosis not present

## 2015-03-21 DIAGNOSIS — E119 Type 2 diabetes mellitus without complications: Secondary | ICD-10-CM | POA: Diagnosis present

## 2015-03-21 DIAGNOSIS — J181 Lobar pneumonia, unspecified organism: Secondary | ICD-10-CM

## 2015-03-21 DIAGNOSIS — G9340 Encephalopathy, unspecified: Secondary | ICD-10-CM | POA: Diagnosis present

## 2015-03-21 DIAGNOSIS — Z87891 Personal history of nicotine dependence: Secondary | ICD-10-CM | POA: Diagnosis not present

## 2015-03-21 DIAGNOSIS — N179 Acute kidney failure, unspecified: Secondary | ICD-10-CM | POA: Diagnosis present

## 2015-03-21 DIAGNOSIS — L8990 Pressure ulcer of unspecified site, unspecified stage: Secondary | ICD-10-CM | POA: Insufficient documentation

## 2015-03-21 DIAGNOSIS — E8770 Fluid overload, unspecified: Secondary | ICD-10-CM | POA: Diagnosis present

## 2015-03-21 DIAGNOSIS — I129 Hypertensive chronic kidney disease with stage 1 through stage 4 chronic kidney disease, or unspecified chronic kidney disease: Secondary | ICD-10-CM | POA: Diagnosis present

## 2015-03-21 DIAGNOSIS — R5381 Other malaise: Secondary | ICD-10-CM | POA: Diagnosis present

## 2015-03-21 DIAGNOSIS — E860 Dehydration: Secondary | ICD-10-CM | POA: Diagnosis present

## 2015-03-21 DIAGNOSIS — I739 Peripheral vascular disease, unspecified: Secondary | ICD-10-CM | POA: Diagnosis present

## 2015-03-21 DIAGNOSIS — A419 Sepsis, unspecified organism: Secondary | ICD-10-CM | POA: Diagnosis present

## 2015-03-21 DIAGNOSIS — R1310 Dysphagia, unspecified: Secondary | ICD-10-CM

## 2015-03-21 DIAGNOSIS — Z90710 Acquired absence of both cervix and uterus: Secondary | ICD-10-CM | POA: Diagnosis not present

## 2015-03-21 DIAGNOSIS — R4701 Aphasia: Secondary | ICD-10-CM | POA: Diagnosis present

## 2015-03-21 DIAGNOSIS — Z794 Long term (current) use of insulin: Secondary | ICD-10-CM | POA: Diagnosis not present

## 2015-03-21 DIAGNOSIS — J45909 Unspecified asthma, uncomplicated: Secondary | ICD-10-CM | POA: Diagnosis present

## 2015-03-21 DIAGNOSIS — Z9049 Acquired absence of other specified parts of digestive tract: Secondary | ICD-10-CM | POA: Diagnosis present

## 2015-03-21 DIAGNOSIS — H409 Unspecified glaucoma: Secondary | ICD-10-CM | POA: Diagnosis present

## 2015-03-21 LAB — I-STAT CG4 LACTIC ACID, ED: Lactic Acid, Venous: 3.09 mmol/L (ref 0.5–2.0)

## 2015-03-21 LAB — BASIC METABOLIC PANEL
ANION GAP: 10 (ref 5–15)
BUN: 17 mg/dL (ref 6–20)
CALCIUM: 8.5 mg/dL — AB (ref 8.9–10.3)
CHLORIDE: 102 mmol/L (ref 101–111)
CO2: 27 mmol/L (ref 22–32)
Creatinine, Ser: 1.4 mg/dL — ABNORMAL HIGH (ref 0.44–1.00)
GFR, EST AFRICAN AMERICAN: 45 mL/min — AB (ref >60)
GFR, EST NON AFRICAN AMERICAN: 39 mL/min — AB (ref >60)
Glucose, Bld: 249 mg/dL — ABNORMAL HIGH (ref 65–99)
Potassium: 4.4 mmol/L (ref 3.5–5.1)
Sodium: 139 mmol/L (ref 135–145)

## 2015-03-21 LAB — MRSA PCR SCREENING: MRSA by PCR: NEGATIVE

## 2015-03-21 LAB — GLUCOSE, CAPILLARY
Glucose-Capillary: 233 mg/dL — ABNORMAL HIGH (ref 65–99)
Glucose-Capillary: 202 mg/dL — ABNORMAL HIGH (ref 65–99)
Glucose-Capillary: 176 mg/dL — ABNORMAL HIGH (ref 65–99)
Glucose-Capillary: 175 mg/dL — ABNORMAL HIGH (ref 65–99)
Glucose-Capillary: 148 mg/dL — ABNORMAL HIGH (ref 65–99)

## 2015-03-21 LAB — HIV ANTIBODY (ROUTINE TESTING W REFLEX): HIV SCREEN 4TH GENERATION: NONREACTIVE

## 2015-03-21 MED ORDER — LATANOPROST 0.005 % OP SOLN
1.0000 [drp] | Freq: Every day | OPHTHALMIC | Status: DC
  Administered 2015-03-21 – 2015-03-25 (×6): 1 [drp] via OPHTHALMIC
  Filled 2015-03-21 (×2): qty 2.5

## 2015-03-21 MED ORDER — FLUTICASONE PROPIONATE 50 MCG/ACT NA SUSP
2.0000 | Freq: Every day | NASAL | Status: DC
  Administered 2015-03-21 – 2015-03-26 (×6): 2 via NASAL
  Filled 2015-03-21: qty 16

## 2015-03-21 MED ORDER — INSULIN GLARGINE 100 UNIT/ML ~~LOC~~ SOLN
32.0000 [IU] | Freq: Every day | SUBCUTANEOUS | Status: DC
  Administered 2015-03-21 – 2015-03-23 (×4): 32 [IU] via SUBCUTANEOUS
  Filled 2015-03-21 (×5): qty 0.32

## 2015-03-21 MED ORDER — SODIUM CHLORIDE 0.9 % IV SOLN
INTRAVENOUS | Status: DC
  Administered 2015-03-21: 03:00:00 via INTRAVENOUS

## 2015-03-21 MED ORDER — HYDRALAZINE HCL 20 MG/ML IJ SOLN
5.0000 mg | Freq: Four times a day (QID) | INTRAMUSCULAR | Status: DC | PRN
Start: 2015-03-21 — End: 2015-03-26
  Administered 2015-03-22: 5 mg via INTRAVENOUS
  Filled 2015-03-21: qty 1

## 2015-03-21 MED ORDER — CLOPIDOGREL BISULFATE 75 MG PO TABS
75.0000 mg | ORAL_TABLET | Freq: Every day | ORAL | Status: DC
  Administered 2015-03-21 – 2015-03-26 (×6): 75 mg via ORAL
  Filled 2015-03-21 (×6): qty 1

## 2015-03-21 MED ORDER — SODIUM CHLORIDE 0.9 % IV BOLUS (SEPSIS)
500.0000 mL | Freq: Once | INTRAVENOUS | Status: AC
  Administered 2015-03-21: 500 mL via INTRAVENOUS

## 2015-03-21 MED ORDER — PANTOPRAZOLE SODIUM 40 MG PO TBEC
40.0000 mg | DELAYED_RELEASE_TABLET | Freq: Every day | ORAL | Status: DC
  Administered 2015-03-21 – 2015-03-26 (×6): 40 mg via ORAL
  Filled 2015-03-21 (×5): qty 1

## 2015-03-21 MED ORDER — INSULIN ASPART 100 UNIT/ML ~~LOC~~ SOLN
0.0000 [IU] | Freq: Three times a day (TID) | SUBCUTANEOUS | Status: DC
  Administered 2015-03-21: 3 [IU] via SUBCUTANEOUS
  Administered 2015-03-21: 5 [IU] via SUBCUTANEOUS
  Administered 2015-03-21 – 2015-03-22 (×2): 3 [IU] via SUBCUTANEOUS
  Administered 2015-03-22: 2 [IU] via SUBCUTANEOUS
  Administered 2015-03-23: 5 [IU] via SUBCUTANEOUS
  Administered 2015-03-23: 3 [IU] via SUBCUTANEOUS
  Administered 2015-03-24: 2 [IU] via SUBCUTANEOUS
  Administered 2015-03-24 – 2015-03-26 (×5): 3 [IU] via SUBCUTANEOUS

## 2015-03-21 MED ORDER — RESOURCE THICKENUP CLEAR PO POWD
ORAL | Status: DC | PRN
  Filled 2015-03-21: qty 125

## 2015-03-21 MED ORDER — BUDESONIDE-FORMOTEROL FUMARATE 160-4.5 MCG/ACT IN AERO
2.0000 | INHALATION_SPRAY | Freq: Two times a day (BID) | RESPIRATORY_TRACT | Status: DC
  Administered 2015-03-21 – 2015-03-26 (×9): 2 via RESPIRATORY_TRACT
  Filled 2015-03-21 (×4): qty 6

## 2015-03-21 MED ORDER — DOCUSATE SODIUM 100 MG PO CAPS
100.0000 mg | ORAL_CAPSULE | Freq: Every day | ORAL | Status: DC
  Administered 2015-03-21 – 2015-03-26 (×6): 100 mg via ORAL
  Filled 2015-03-21 (×6): qty 1

## 2015-03-21 MED ORDER — HYDRALAZINE HCL 50 MG PO TABS
50.0000 mg | ORAL_TABLET | Freq: Two times a day (BID) | ORAL | Status: DC
  Administered 2015-03-21 – 2015-03-26 (×11): 50 mg via ORAL
  Filled 2015-03-21 (×13): qty 1

## 2015-03-21 MED ORDER — ATENOLOL 50 MG PO TABS
50.0000 mg | ORAL_TABLET | Freq: Every day | ORAL | Status: DC
  Administered 2015-03-21 – 2015-03-26 (×6): 50 mg via ORAL
  Filled 2015-03-21 (×6): qty 1

## 2015-03-21 MED ORDER — AZELASTINE HCL 0.1 % NA SOLN
1.0000 | Freq: Two times a day (BID) | NASAL | Status: DC
  Administered 2015-03-21 – 2015-03-26 (×11): 1 via NASAL
  Filled 2015-03-21: qty 30

## 2015-03-21 MED ORDER — VANCOMYCIN HCL 10 G IV SOLR
1250.0000 mg | INTRAVENOUS | Status: DC
  Administered 2015-03-21 – 2015-03-23 (×2): 1250 mg via INTRAVENOUS
  Filled 2015-03-21 (×2): qty 1250

## 2015-03-21 MED ORDER — PROMETHAZINE HCL 25 MG PO TABS: 25.0000 mg | ORAL_TABLET | ORAL | Status: DC | PRN

## 2015-03-21 MED ORDER — PIPERACILLIN-TAZOBACTAM 3.375 G IVPB
3.3750 g | Freq: Three times a day (TID) | INTRAVENOUS | Status: DC
  Administered 2015-03-21 – 2015-03-24 (×10): 3.375 g via INTRAVENOUS
  Filled 2015-03-21 (×12): qty 50

## 2015-03-21 MED ORDER — CETYLPYRIDINIUM CHLORIDE 0.05 % MT LIQD
7.0000 mL | Freq: Two times a day (BID) | OROMUCOSAL | Status: DC
  Administered 2015-03-21 – 2015-03-26 (×11): 7 mL via OROMUCOSAL

## 2015-03-21 MED ORDER — PREGABALIN 100 MG PO CAPS
200.0000 mg | ORAL_CAPSULE | Freq: Two times a day (BID) | ORAL | Status: DC
  Administered 2015-03-21 – 2015-03-26 (×11): 200 mg via ORAL
  Filled 2015-03-21 (×12): qty 2

## 2015-03-21 MED ORDER — ATORVASTATIN CALCIUM 80 MG PO TABS
80.0000 mg | ORAL_TABLET | Freq: Every day | ORAL | Status: DC
  Administered 2015-03-21 – 2015-03-25 (×5): 80 mg via ORAL
  Filled 2015-03-21 (×6): qty 1

## 2015-03-21 MED ORDER — INSULIN ASPART 100 UNIT/ML ~~LOC~~ SOLN
4.0000 [IU] | Freq: Three times a day (TID) | SUBCUTANEOUS | Status: DC
  Administered 2015-03-21 – 2015-03-26 (×11): 4 [IU] via SUBCUTANEOUS

## 2015-03-21 MED ORDER — HYDRALAZINE HCL 20 MG/ML IJ SOLN
10.0000 mg | Freq: Once | INTRAMUSCULAR | Status: AC
  Administered 2015-03-21: 10 mg via INTRAVENOUS
  Filled 2015-03-21: qty 1

## 2015-03-21 MED ORDER — MONTELUKAST SODIUM 10 MG PO TABS
10.0000 mg | ORAL_TABLET | Freq: Every day | ORAL | Status: DC
  Administered 2015-03-21 – 2015-03-25 (×6): 10 mg via ORAL
  Filled 2015-03-21 (×7): qty 1

## 2015-03-21 MED ORDER — IPRATROPIUM-ALBUTEROL 0.5-2.5 (3) MG/3ML IN SOLN
3.0000 mL | Freq: Four times a day (QID) | RESPIRATORY_TRACT | Status: DC
  Administered 2015-03-21 – 2015-03-23 (×10): 3 mL via RESPIRATORY_TRACT
  Filled 2015-03-21 (×9): qty 3

## 2015-03-21 MED ORDER — HEPARIN SODIUM (PORCINE) 5000 UNIT/ML IJ SOLN
5000.0000 [IU] | Freq: Three times a day (TID) | INTRAMUSCULAR | Status: DC
  Administered 2015-03-21 – 2015-03-26 (×16): 5000 [IU] via SUBCUTANEOUS
  Filled 2015-03-21 (×20): qty 1

## 2015-03-21 MED ORDER — ENALAPRIL MALEATE 2.5 MG PO TABS
2.5000 mg | ORAL_TABLET | Freq: Every day | ORAL | Status: DC
  Filled 2015-03-21: qty 1

## 2015-03-21 NOTE — ED Notes (Signed)
Spoke with Dr. Julian ReilGardner, states placed order for 10mg  hydralazine IV and PO med need to be crushed and given with apple sauce.

## 2015-03-21 NOTE — Progress Notes (Signed)
   [facility]                                                                              Patient Demographics  Valerie Rodriguez, is a 64 y.o. female, DOB - 01/29/1951, WUJ:[id number]  Admit date - 03/20/2015   Admitting Physician Hillary BowJared M Gardner, DO  Outpatient Primary MD for the patient is No PCP Per Patient  LOS - 0   Chief Complaint  Patient presents with  . Altered Mental Status      HPI on 03/21/2015 by Dr. Lyda PeroneJared Gardner Valerie Rodriguez is a 64 y.o. 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 Patient presents to ED with AMS. At baseline patient signs. Today patient was altered, less responsive, brought in by EMS with low O2 sat on room air. After arrival she is satting well on 2L of O2. Fever was noted to be 104.2. Work up demonstrates RLL PNA on CXR. Patients mental status improved and she is now signing to family member who is in room with her. Assessment & Plan   Patient admitted earlier this morning by Dr. Nestor LewandowskyJerry Gardner, agree with current assessment and plan. After meals for H&P for details.  Sepsis secondary to HCAP -Upon admission, patient was febrile with tachycardia -Chest x-ray: Suspect right lower lobe infiltrate -Continue vancomycin and Zosyn -Blood cultures, sputum culture pending -Strep pneumonia and legionella urine antigens pending  Acute renal Insufficiency -Creatinine upon admission was 1.3, currently 1.4 -Baseline is 0.9 -Likely secondary to dehydration from sepsis -Continue gentle IV fluid -Will continue to monitor BMP -If no improvement, will obtain renal ultrasound and possibly consult nephrology  Diabetes mellitus -Continue Lantus, insulin sliding scale CBG monitoring  Acute encephalopathy -Likely secondary to sepsis -CT of the head: No acute intracranial abnormality -Will continue to monitor closely  Deafness  History of CVA -Patient has persistent dysphagia, currently on pure diet -CT of the  head showed no acute intracranial abnormality -Continue Plavix and statin  Hypertension -Continue atenolol -Will hold enalapril due to renal insufficiency -Will add on hydralazine PRN  Code Status: Full  Family Communication: None at bedside  Disposition Plan: Admitted  Time Spent in minutes   30 minutes  Procedures  None  Consults   None  DVT Prophylaxis  Heparin  Saanvi Hakala D.O. on 03/21/2015 at 10:43 AM  Between 7am to 7pm - Pager - [phone]  After 7pm go to [email or web address] - password [email or web address]  And look for the night coverage person covering for me after hours  [facility] Office  [phone]

## 2015-03-21 NOTE — H&P (Signed)
[facility] History and Physical  Valerie Rodriguez JXB:[id number] DOB: 04/28/1951 DOA: 03/20/2015  Referring physician: EDP PCP: No PCP Per Patient   Chief Complaint: AMS   HPI: Valerie HumphreyShirley R Sofranko is a 64 y.o. 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  Patient presents to ED with AMS.  At baseline patient signs.  Today patient was altered, less responsive, brought in by EMS with low O2 sat on room air.  After arrival she is satting well on 2L of O2.  Fever was noted to be 104.2.  Work up demonstrates RLL PNA on CXR.  Patients mental status improved and she is now signing to family member who is in room with her.  Review of Systems: Systems reviewed.  As above, otherwise negative  Past Medical History  Diagnosis Date  . Hypertension   . Hyperlipemia   . Deaf-mutism   . Glaucoma     Dr Eulah Pontashwell  . Diabetes mellitus     Type II, sees Dr. Candie ChromanBindubal Balen  . Allergy   . Asthma     sees Dr [place] CallasSharma  . Stroke 11-05-10    sees Dr. Pearlean BrownieSethi  . Chronic kidney disease     sees Dr. Camille Balynthia Dunham   . PVD (peripheral vascular disease)     sees Dr. Hart RochesterLawson   . PAD (peripheral artery disease)    Past Surgical History  Procedure Laterality Date  . Cholecystectomy    . Abdominal hysterectomy      oophorectomy 1976  . Rotator cuff repair  2004  . Colonoscopy  03-30-09    per Dr. Marina GoodellPerry, poor prep, repeat one yr    Social History:  reports that she quit smoking about 35 years ago. Her smoking use included Cigarettes. She has never used smokeless tobacco. She reports that she does not drink alcohol or use illicit drugs.  No Known Allergies  Family History  Problem Relation Age of Onset  . Hypertension      family hx  . Coronary artery disease      female 1st degree relative  . Hyperlipidemia      family hx  . Diabetes      1st degree relative  . Diabetes Mother   . Heart disease Mother   . Hypertension Mother   . Hyperlipidemia Mother      Prior  to Admission medications   Medication Sig Start Date End Date Taking? Authorizing Provider  albuterol-ipratropium (COMBIVENT) 18-103 MCG/ACT inhaler Inhale 2 puffs into the lungs 4 (four) times daily. 01/06/14   Nelwyn SalisburyStephen A Fry, MD  atenolol (TENORMIN) 50 MG tablet Take 1 tablet (50 mg total) by mouth daily. 01/06/14   Nelwyn SalisburyStephen A Fry, MD  atorvastatin (LIPITOR) 80 MG tablet Take 1 tablet (80 mg total) by mouth daily. 01/06/14   Nelwyn SalisburyStephen A Fry, MD  azelastine (ASTELIN) 137 MCG/SPRAY nasal spray Place 1 spray into both nostrils 2 (two) times daily. Use in each nostril as directed 01/06/14   Nelwyn SalisburyStephen A Fry, MD  budesonide-formoterol [facility](SYMBICORT) 160-4.5 MCG/ACT inhaler Inhale 2 puffs into the lungs 2 (two) times daily. 01/06/14   Nelwyn SalisburyStephen A Fry, MD  citalopram (CELEXA) 20 MG tablet Take 0.5 tablets (10 mg total) by mouth daily. 01/06/14   Nelwyn SalisburyStephen A Fry, MD  clopidogrel (PLAVIX) 75 MG tablet Take 1 tablet (75 mg total) by mouth daily. 01/06/14   Nelwyn SalisburyStephen A Fry, MD  enalapril (VASOTEC) 2.5 MG tablet  04/06/14   Historical Provider, MD  fluticasone (FLONASE) 50 MCG/ACT nasal spray Place 2 sprays into both nostrils daily. 01/06/14   Nelwyn Salisbury, MD  furosemide (LASIX) 20 MG tablet Take 1 tablet (20 mg total) by mouth daily. 01/01/14   Nelwyn Salisbury, MD  Glucose Blood (BAYER BREEZE 2 TEST) DISK 2 (two) times daily.      Historical Provider, MD  glucose blood test strip 1 each by Other route 4 (four) times daily. Use as instructed    Historical Provider, MD  hydrALAZINE (APRESOLINE) 50 MG tablet Take 1 tablet (50 mg total) by mouth 2 (two) times daily. 01/06/14   Nelwyn Salisbury, MD  insulin aspart (NOVOLOG) 100 UNIT/ML injection Inject 15 Units into the skin 3 (three) times daily before meals. 01/06/14   Nelwyn Salisbury, MD  insulin glargine (LANTUS) 100 UNIT/ML injection Inject 0.2 mLs (20 Units total) into the skin at bedtime. 01/14/13   Richarda Overlie, MD  insulin glargine (LANTUS) 100 UNIT/ML injection Inject 0.25 mLs (25 Units  total) into the skin at bedtime. 01/16/13   Richarda Overlie, MD  latanoprost (XALATAN) 0.005 % ophthalmic solution Place 1 drop into both eyes at bedtime.      Historical Provider, MD  montelukast (SINGULAIR) 10 MG tablet Take 1 tablet (10 mg total) by mouth at bedtime. 01/06/14   Nelwyn Salisbury, MD  NOVOLOG FLEXPEN 100 UNIT/ML FlexPen INJECT 15 UNITS INTO THE SKIN 3 (THREE) TIMES DAILY BEFORE MEALS. 05/03/14   Nelwyn Salisbury, MD  pantoprazole (PROTONIX) 40 MG tablet Take 1 tablet (40 mg total) by mouth daily. 01/06/14   Nelwyn Salisbury, MD  pregabalin (LYRICA) 200 MG capsule Take 1 capsule (200 mg total) by mouth 2 (two) times daily. 01/06/14   Nelwyn Salisbury, MD  promethazine (PHENERGAN) 25 MG tablet Take 25 mg by mouth every 4 (four) hours as needed for nausea. 12/14/11   Nelwyn Salisbury, MD  VENTOLIN HFA 108 (90 BASE) MCG/ACT inhaler  02/11/14   Historical Provider, MD   Physical Exam: Filed Vitals:   03/21/15 0002  BP:   Pulse:   Temp: 100 F (37.8 C)  Resp:     BP 169/94 mmHg  Pulse 101  Temp(Src) 100 F (37.8 C) (Oral)  Resp 18  SpO2 99%  General Appearance:    Alert, oriented, no distress, appears stated age  Head:    Normocephalic, atraumatic  Eyes:    PERRL, EOMI, sclera non-icteric        Nose:   Nares without drainage or epistaxis. Mucosa, turbinates normal  Throat:   Moist mucous membranes. Oropharynx without erythema or exudate.  Neck:   Supple. No carotid bruits.  No thyromegaly.  No lymphadenopathy.   Back:     No CVA tenderness, no spinal tenderness  Lungs:     Clear to auscultation bilaterally, without wheezes, rhonchi or rales  Chest wall:    No tenderness to palpitation  Heart:    Regular rate and rhythm without murmurs, gallops, rubs  Abdomen:     Soft, non-tender, nondistended, normal bowel sounds, no organomegaly  Genitalia:    deferred  Rectal:    deferred  Extremities:   No clubbing, cyanosis or edema.  Pulses:   2+ and symmetric all extremities  Skin:   Skin color,  texture, turgor normal, no rashes or lesions  Lymph nodes:   Cervical, supraclavicular, and axillary nodes normal  Neurologic:   CNII-XII intact. Normal strength, sensation and reflexes  throughout    Labs on Admission:  Basic Metabolic Panel:  Recent Labs Lab 03/20/15 2301  NA 137  K 4.5  CL 99*  CO2 27  GLUCOSE 201*  BUN 18  CREATININE 1.34*  CALCIUM 9.2   Liver Function Tests:  Recent Labs Lab 03/20/15 2301  AST 27  ALT 20  ALKPHOS 81  BILITOT 0.7  PROT 7.6  ALBUMIN 3.2*   No results for input(s): LIPASE, AMYLASE in the last 168 hours. No results for input(s): AMMONIA in the last 168 hours. CBC:  Recent Labs Lab 03/20/15 2301  WBC 7.6  NEUTROABS 6.4  HGB 14.0  HCT 42.6  MCV 79.6  PLT 215   Cardiac Enzymes: No results for input(s): CKTOTAL, CKMB, CKMBINDEX, TROPONINI in the last 168 hours.  BNP (last 3 results) No results for input(s): PROBNP in the last 8760 hours. CBG: No results for input(s): GLUCAP in the last 168 hours.  Radiological Exams on Admission: Ct Head Wo Contrast  03/21/2015   CLINICAL DATA:  Altered mental status.  EXAM: CT HEAD WITHOUT CONTRAST  TECHNIQUE: Contiguous axial images were obtained from the base of the skull through the vertex without intravenous contrast.  COMPARISON:  Head CT 01/09/2013  FINDINGS: Generalized cerebral and cerebellar volume loss. Stable periventricular white matter change. Remote lacunar infarcts in the left caudate and thalamus. No intracranial hemorrhage, mass effect, or midline shift. No hydrocephalus. The basilar cisterns are patent. No evidence of acute ischemia. No intracranial fluid collection. Calvarium is intact. Included paranasal sinuses and mastoid air cells are well aerated.  IMPRESSION: Stable atrophy and chronic small vessel ischemic change. No acute intracranial abnormality.   Electronically Signed   By: Rubye Oaks M.D.   On: 03/21/2015 00:38   Dg Chest Port 1 View  03/20/2015    CLINICAL DATA:  Low oxygen saturation.  EXAM: PORTABLE CHEST - 1 VIEW  COMPARISON:  Chest radiograph 01/14/2013.  FINDINGS: Low lung volumes. Suspected cardiomegaly. Vascular congestion without overt failure. RIGHT lower lung zone opacity could represent pneumonia. Difficult to evaluate for pleural effusion. Vascular calcification. Osteopenia.  IMPRESSION: Suspected RIGHT lower lobe infiltrate.   Electronically Signed   By: Davonna Belling M.D.   On: 03/20/2015 23:43    EKG: Independently reviewed.  Assessment/Plan Principal Problem:   HCAP (healthcare-associated pneumonia) Active Problems:   DM2 (diabetes mellitus, type 2)   Hearing loss   Sepsis   Right lower lobe pneumonia   1. HCAP - RLL PNA 1. Zosyn and vanc per pharm 2. pna pathway 3. Cultures pending 2. Sepsis secondary to PNA - 1. Getting IVF in ED 2. Continuing gentle hydration 3. Recheck BMP in AM due to slight bump in creatinine (baseline 0.9, now up to 1.3) 4. ABx as above 3. DM2 - 1. continue the 32 units daily of lantus she gets in the evening at NH 2. 4 units with meals and moderate dose SSI ordered 4. Deafness    Code Status: Full Code  Family Communication: At bedside Disposition Plan: Admit to inpatient   Time spent: 70 min  Ronnika Collett M. [facility] Pager [phone]  If 7AM-7PM, please contact the day team taking care of the patient [email or web address] Password [email or web address] 03/21/2015, 12:51 AM

## 2015-03-21 NOTE — Progress Notes (Signed)
Pt arrived to floor with daughter at bedside. Admission history performed with sign language interpretation from daughter. Pt A/O to self and place, does not know why she came to hospital or what year it is. Pt in NAD, call bell within reach, VSS.

## 2015-03-21 NOTE — Progress Notes (Signed)
64yo female presents w/ low O2 sats from SNF, CXR shows possible RLL infiltrate, to begin IV ABX.  Will start Zosyn 3.75g IV Q8H for CrCl ~45 ml/min and monitor CBC and Cx.  Vernard GamblesVeronda Bohden Dung, PharmD, BCPS 03/21/2015 12:55 AM

## 2015-03-21 NOTE — Progress Notes (Signed)
ANTIBIOTIC CONSULT NOTE - INITIAL  Pharmacy Consult for Vanco/Zosyn Indication: pneumonia  No Known Allergies  Patient Measurements: Height: 4\' 9"  (144.8 cm) Weight: 208 lb 15.9 oz (94.8 kg) IBW/kg (Calculated) : 38.6 Adjusted Body Weight:    Vital Signs: Temp: 98.6 F (37 C) (06/06 0645) Temp Source: Oral (06/06 0645) BP: 161/65 mmHg (06/06 0645) Pulse Rate: 89 (06/06 0645) Intake/Output from previous day: 06/05 0701 - 06/06 0700 In: -  Out: 25 [Urine:25] Intake/Output from this shift: Total I/O In: 120 [P.O.:120] Out: -   Labs:  Recent Labs  03/20/15 2301 03/21/15 0403  WBC 7.6  --   HGB 14.0  --   PLT 215  --   CREATININE 1.34* 1.40*   Estimated Creatinine Clearance: 39.7 mL/min (by C-G formula based on Cr of 1.4). No results for input(s): VANCOTROUGH, VANCOPEAK, VANCORANDOM, GENTTROUGH, GENTPEAK, GENTRANDOM, TOBRATROUGH, TOBRAPEAK, TOBRARND, AMIKACINPEAK, AMIKACINTROU, AMIKACIN in the last 72 hours.   Microbiology: Recent Results (from the past 720 hour(s))  MRSA PCR Screening     Status: None   Collection Time: 03/21/15  2:49 AM  Result Value Ref Range Status   MRSA by PCR NEGATIVE NEGATIVE Final    Comment:        The GeneXpert MRSA Assay (FDA approved for NASAL specimens only), is one component of a comprehensive MRSA colonization surveillance program. It is not intended to diagnose MRSA infection nor to guide or monitor treatment for MRSA infections.     Medical History: Past Medical History  Diagnosis Date  . Hypertension   . Hyperlipemia   . Deaf-mutism   . Glaucoma     Dr Eulah Pontashwell  . Diabetes mellitus     Type II, sees Dr. Candie ChromanBindubal Balen  . Allergy   . Asthma     sees Dr [place] CallasSharma  . Stroke 11-05-10    sees Dr. Pearlean BrownieSethi  . Chronic kidney disease     sees Dr. Camille Balynthia Dunham   . PVD (peripheral vascular disease)     sees Dr. Hart RochesterLawson   . PAD (peripheral artery disease)     Assessment: 64yo female presents w/ low O2 sats and AMS from  SNF, CXR shows possible RLL infiltrate, to begin IV ABX  Anticoagulation: SQ heparin. CBC WNL Infectious Disease: Abx for sepsis secondary to HCAP. RLL infiltrate. Tmax 104.2. WBC 7.6. CrCl 39 Zosyn 6/6>> Vanco 6/6>>  Cardiovascular: HTN: BP 161/65 on atenolol, Lipitor80, Plavix, hydralazine (ACEI on hold) Endocrinology: DM on SSI and Lantus Gastrointestinal / Nutrition: po PPI Neurology: Deafness, h/o CVA, Acute encephalopathy. Lyrica Nephrology: ARI possibly from dehydration. Pulmonary: Astelin, Symbicort, Flonase, Duonebx, Singulair Hematology / Oncology: CBC WNL PTA Medication Issues: Celexa, lasix, enalapril Best Practices: SQ heparin, MC, PPI   Goal of Therapy:  Vancomycin trough level 15-20 mcg/ml  Plan:  Zosyn 3.375g IV q8hr Vanco 1g in ED (6/6 at 0017). Then start Vanco 1250mg  IV q24h. Vanco trough at steady state after 3-5 doses. F/u to resume Celexa when mental status improves.   Brance Dartt S. Merilynn Finlandobertson, PharmD, BCPS Clinical Staff Pharmacist Pager [phone]  Misty Stanleyobertson, Tristina Sahagian Stillinger 03/21/2015,11:37 AM

## 2015-03-21 NOTE — Evaluation (Signed)
Clinical/Bedside Swallow Evaluation Patient Details  Name: Valerie HumphreyShirley R Heyboer MRN: [id number] Date of Birth: 11/06/1950  Today's Date: 03/21/2015 Time: SLP Start Time (ACUTE ONLY): 0950 SLP Stop Time (ACUTE ONLY): 1023 SLP Time Calculation (min) (ACUTE ONLY): 33 min  Past Medical History:  Past Medical History  Diagnosis Date  . Hypertension   . Hyperlipemia   . Deaf-mutism   . Glaucoma     Dr Eulah Pontashwell  . Diabetes mellitus     Type II, sees Dr. Candie ChromanBindubal Balen  . Allergy   . Asthma     sees Dr [place] CallasSharma  . Stroke 11-05-10    sees Dr. Pearlean BrownieSethi  . Chronic kidney disease     sees Dr. Camille Balynthia Dunham   . PVD (peripheral vascular disease)     sees Dr. Hart RochesterLawson   . PAD (peripheral artery disease)    Past Surgical History:  Past Surgical History  Procedure Laterality Date  . Cholecystectomy    . Abdominal hysterectomy      oophorectomy 1976  . Rotator cuff repair  2004  . Colonoscopy  03-30-09    per Dr. Marina GoodellPerry, poor prep, repeat one yr    HPI:  64 y.o. female with h/o deafness, prior stroke resulting in dysphagia (on puree diet) admitted with  AMS. Work up demonstrates RLL PNA on CXR.BSE 01/15/13 with audible swallow, no s/s aspiration and regular thin recommended. CT No acute intracranial abnormality. CXR Suspected RIGHT lower lobe infiltrate.   Assessment / Plan / Recommendation Clinical Impression  Pt seen for bedside swallow providing pt with written information re: purpose of assessment and responses needed throughout eval. Pt at increased risk aspiration due to difficulty initiating and prolonging phonation as well as initiating dry swallow. She demonstrated increased work of breathing, delayed cough with thin and nectar liquids and delayed throat clear. SLP downgraded liquids to honey thick and continue Dys 1. She would benefit from objective assessment with FEES (due to body habitus) to fully assess swallow function.      Aspiration Risk  Moderate    Diet Recommendation Dysphagia 1  (Puree);Honey   Medication Administration: Whole meds with puree Compensations: Slow rate;Small sips/bites    Other  Recommendations Oral Care Recommendations: Oral care BID Other Recommendations: Order thickener from pharmacy   Follow Up Recommendations       Frequency and Duration min 2x/week  2 weeks   Pertinent Vitals/Pain none         Swallow Study           Oral/Motor/Sensory Function Overall Oral Motor/Sensory Function:  (decreased strength and coordination)   Ice Chips Ice chips: Not tested   Thin Liquid Thin Liquid: Impaired Presentation: Cup Pharyngeal  Phase Impairments: Suspected delayed Swallow;Cough - Delayed (suspect premature spill, watery eyes)    Nectar Thick Nectar Thick Liquid: Impaired Presentation: Cup Pharyngeal Phase Impairments: Throat Clearing - Delayed;Suspected delayed Swallow;Multiple swallows   Honey Thick Honey Thick Liquid: Impaired Pharyngeal Phase Impairments: Multiple swallows;Throat Clearing - Delayed   Puree Puree: Impaired Pharyngeal Phase Impairments: Throat Clearing - Delayed   Solid   GO    Solid: Not tested       Royce MacadamiaLitaker, Clayson Riling Willis 03/21/2015,12:30 PM  Breck CoonsLisa Willis Lonell FaceLitaker M.Ed [employer] Pager [phone]

## 2015-03-22 DIAGNOSIS — N390 Urinary tract infection, site not specified: Secondary | ICD-10-CM

## 2015-03-22 LAB — CBC
HCT: 32.2 % — ABNORMAL LOW (ref 36.0–46.0)
Hemoglobin: 10.5 g/dL — ABNORMAL LOW (ref 12.0–15.0)
MCH: 25.7 pg — ABNORMAL LOW (ref 26.0–34.0)
MCHC: 32.6 g/dL (ref 30.0–36.0)
MCV: 78.7 fL (ref 78.0–100.0)
PLATELETS: 182 10*3/uL (ref 150–400)
RBC: 4.09 MIL/uL (ref 3.87–5.11)
RDW: 15.7 % — AB (ref 11.5–15.5)
WBC: 8 10*3/uL (ref 4.0–10.5)

## 2015-03-22 LAB — BASIC METABOLIC PANEL
ANION GAP: 11 (ref 5–15)
BUN: 16 mg/dL (ref 6–20)
CHLORIDE: 105 mmol/L (ref 101–111)
CO2: 24 mmol/L (ref 22–32)
Calcium: 8.3 mg/dL — ABNORMAL LOW (ref 8.9–10.3)
Creatinine, Ser: 1.29 mg/dL — ABNORMAL HIGH (ref 0.44–1.00)
GFR, EST AFRICAN AMERICAN: 50 mL/min — AB (ref >60)
GFR, EST NON AFRICAN AMERICAN: 43 mL/min — AB (ref >60)
Glucose, Bld: 221 mg/dL — ABNORMAL HIGH (ref 65–99)
POTASSIUM: 4.2 mmol/L (ref 3.5–5.1)
Sodium: 140 mmol/L (ref 135–145)

## 2015-03-22 LAB — LACTIC ACID, PLASMA
Lactic Acid, Venous: 1.8 mmol/L (ref 0.5–2.0)
Lactic Acid, Venous: 2.5 mmol/L (ref 0.5–2.0)

## 2015-03-22 LAB — GLUCOSE, CAPILLARY
GLUCOSE-CAPILLARY: 117 mg/dL — AB (ref 65–99)
Glucose-Capillary: 196 mg/dL — ABNORMAL HIGH (ref 65–99)
Glucose-Capillary: 144 mg/dL — ABNORMAL HIGH (ref 65–99)
Glucose-Capillary: 159 mg/dL — ABNORMAL HIGH (ref 65–99)

## 2015-03-22 MED ORDER — ACETAMINOPHEN 325 MG PO TABS
650.0000 mg | ORAL_TABLET | Freq: Four times a day (QID) | ORAL | Status: DC | PRN
  Administered 2015-03-22 – 2015-03-24 (×2): 650 mg via ORAL
  Filled 2015-03-22 (×2): qty 2

## 2015-03-22 NOTE — Progress Notes (Signed)
Speech Language Pathology Treatment: Dysphagia  Patient Details Name: Valerie Rodriguez MRN: [id number] DOB: 11/23/1950 Today's Date: 03/22/2015 Time: [id number] SLP Time Calculation (min) (ACUTE ONLY): 22 min  Assessment / Plan / Recommendation Clinical Impression  Dysphagia treatment revealed continued increased work of breathing (above baseline) following honey thick liquids and suspected delayed swallow initiation and/or pharyngeal residue. Min-mod verbal/tactile cues/feeding assist provided. Recommend objective assessment with FEES (due to body habitus) next date; continue Dys 1, honey until assessment. Informed consent received over phone with daughter [name](Bonnie).   HPI Other Pertinent Information: 64 y.o. female with h/o deafness, prior stroke resulting in dysphagia (on puree diet) admitted with  AMS. Work up demonstrates RLL PNA on CXR.BSE 01/15/13 with audible swallow, no s/s aspiration and regular thin recommended. CT No acute intracranial abnormality. CXR Suspected RIGHT lower lobe infiltrate.   Pertinent Vitals Pain Assessment: No/denies pain  SLP Plan   (FEES)    Recommendations Diet recommendations: Honey-thick liquid;Dysphagia 1 (puree) Liquids provided via: Cup Medication Administration: Whole meds with puree Supervision: Staff to assist with self feeding;Full supervision/cueing for compensatory strategies Compensations: Slow rate;Small sips/bites Postural Changes and/or Swallow Maneuvers: Seated upright 90 degrees              Oral Care Recommendations: Oral care BID Follow up Recommendations:  (TBD) Plan:  (FEES)    GO     Royce MacadamiaLitaker, Amberlin Utke Willis 03/22/2015, 3:06 PM  Breck CoonsLisa Willis Lonell FaceLitaker M.Ed [employer] Pager [phone]

## 2015-03-22 NOTE — Progress Notes (Signed)
[facility]                                                                              Patient Demographics  Valerie Rodriguez, is a 64 y.o. female, DOB - 12-11-1950, ZOX:[id number]  Admit date - 03/20/2015   Admitting Physician Hillary Bow, DO  Outpatient Primary MD for the patient is No PCP Per Patient  LOS - 1   Chief Complaint  Patient presents with  . Altered Mental Status      HPI on 03/21/2015 by Dr. Lyda Perone Valerie Rodriguez is a 64 y.o. 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 Patient presents to ED with AMS. At baseline patient signs. Today patient was altered, less responsive, brought in by EMS with low O2 sat on room air. After arrival she is satting well on 2L of O2. Fever was noted to be 104.2. Work up demonstrates RLL PNA on CXR. Patients mental status improved and she is now signing to family member who is in room with her.  Assessment & Plan   Sepsis secondary to HCAP/UTI -Upon admission, patient was febrile with tachycardia -Chest x-ray: Suspect right lower lobe infiltrate -UA: WBC 11-20, many bacteria, small leukocytes -Continue vancomycin and Zosyn -Blood cultures show no growth to date -Urine culture showed>100K Ecoli, sensitivities pending -Strep pneumonia and legionella urine antigens pending  Acute renal Insufficiency -Creatinine upon admission was 1.3, currently 1.29 -Baseline is 0.9 -Likely secondary to dehydration from sepsis -Continue gentle IV fluid -Will continue to monitor BMP  Diabetes mellitus -Continue Lantus, insulin sliding scale CBG monitoring  Acute encephalopathy -Likely secondary to sepsis -Seems to be at baseline -CT of the head: No acute intracranial abnormality -Will continue to monitor closely  Deafness  History of CVA -Patient has persistent dysphagia, currently on pure diet -CT of the head showed no acute intracranial abnormality -Continue Plavix and  statin  Hypertension -Continue atenolol -Enalapril held due to renal insufficiency -Continue hydralazine PRN  Code Status: Full  Family Communication: None at bedside  Disposition Plan: Admitted  Time Spent in minutes 30 minutes  Procedures  None  Consults  None  DVT Prophylaxis Heparin Lab Results  Component Value Date   PLT 182 03/22/2015    Medications  Scheduled Meds: . antiseptic oral rinse  7 mL Mouth Rinse BID  . atenolol  50 mg Oral Daily  . atorvastatin  80 mg Oral q1800  . azelastine  1 spray Each Nare BID  . budesonide-formoterol  2 puff Inhalation BID  . clopidogrel  75 mg Oral Daily  . docusate sodium  100 mg Oral Daily  . fluticasone  2 spray Each Nare Daily  . heparin  5,000 Units Subcutaneous 3 times per day  . hydrALAZINE  50 mg Oral BID  . insulin aspart  0-15 Units Subcutaneous TID WC  . insulin aspart  4 Units Subcutaneous TID WC  . insulin glargine  32 Units Subcutaneous QHS  . ipratropium-albuterol  3 mL Inhalation 4 times per day  . latanoprost  1 drop Both Eyes QHS  . montelukast  10 mg Oral QHS  . pantoprazole  40 mg Oral Daily  .  piperacillin-tazobactam (ZOSYN)  IV  3.375 g Intravenous Q8H  . pregabalin  200 mg Oral BID  . vancomycin  1,250 mg Intravenous Q24H   Continuous Infusions: . sodium chloride 75 mL/hr at 03/21/15 0304   PRN Meds:.acetaminophen, hydrALAZINE, promethazine, RESOURCE THICKENUP CLEAR  Antibiotics    Anti-infectives    Start     Dose/Rate Route Frequency Ordered Stop   03/21/15 0600  piperacillin-tazobactam (ZOSYN) IVPB 3.375 g     3.375 g 12.5 mL/hr over 240 Minutes Intravenous Every 8 hours 03/21/15 0056     03/21/15 0000  vancomycin (VANCOCIN) 1,250 mg in sodium chloride 0.9 % 250 mL IVPB     1,250 mg 166.7 mL/hr over 90 Minutes Intravenous Every 24 hours 03/21/15 1144     03/20/15 2315  vancomycin (VANCOCIN) IVPB 1000 mg/200 mL premix     1,000 mg 200 mL/hr over 60 Minutes Intravenous  Once  03/20/15 2313 03/21/15 0129   03/20/15 2315  piperacillin-tazobactam (ZOSYN) IVPB 3.375 g     3.375 g 100 mL/hr over 30 Minutes Intravenous  Once 03/20/15 2313 03/21/15 0001        Subjective:   Valerie Rodriguez seen and examined today.  Patient is deaf however can't read and answer yes no questions. Patient currently denies any pain, chest pain, dizziness, headache, abdominal pain. Feels her breathing has improved..   Objective:   Filed Vitals:   03/21/15 2100 03/22/15 0625 03/22/15 0900 03/22/15 1027  BP: 163/77 187/92  151/70  Pulse: 88 95  93  Temp: 98 F (36.7 C) 99.7 F (37.6 C)  100.5 F (38.1 C)  TempSrc: Oral Oral  Oral  Resp: Height:      Weight:  93.961 kg (207 lb 2.3 oz)    SpO2: 99% 97% 99% 97%    Wt Readings from Last 3 Encounters:  03/22/15 93.961 kg (207 lb 2.3 oz)  03/13/13 69.967 kg (154 lb 4 oz)  01/11/13 43.2 kg (95 lb 3.8 oz)     Intake/Output Summary (Last 24 hours) at 03/22/15 1111 Last data filed at 03/22/15 0816  Gross per 24 hour  Intake 3003.25 ml  Output      0 ml  Net 3003.25 ml    Exam  General: Well developed, well nourished, no distress   HEENT: NCAT, mucous membranes moist.   Cardiovascular: S1 S2 auscultated, no rubs, murmurs or gallops. Regular rate and rhythm.  Respiratory: Diminished breath sounds throughout, some rhonchi  Abdomen: Soft, obese, nontender, nondistended, + bowel sounds  Extremities: warm dry without cyanosis clubbing or edema  Neuro: Patient is deaf.  Awake and alert, oriented. Able to answer yes/no questions and read lips as well as written statements.  Data Review   Micro Results Recent Results (from the past 240 hour(s))  Urine culture     Status: None (Preliminary result)   Collection Time: 03/20/15 10:55 PM  Result Value Ref Range Status   Specimen Description URINE, CLEAN CATCH  Final   Special Requests NONE  Final   Colony Count   Final    >=100,000 COLONIES/ML Performed at  [facility]    Culture   Final    ESCHERICHIA COLI Performed at [facility]    Report Status PENDING  Incomplete  Blood culture (routine x 2)     Status: None (Preliminary result)   Collection Time: 03/20/15 10:57 PM  Result Value Ref Range Status   Specimen Description BLOOD RIGHT ANTECUBITAL  Final   Special Requests BOTTLES DRAWN AEROBIC ONLY 2CC  Final   Culture   Final           BLOOD CULTURE RECEIVED NO GROWTH TO DATE CULTURE WILL BE HELD FOR 5 DAYS BEFORE ISSUING A FINAL NEGATIVE REPORT Performed at [facility]    Report Status PENDING  Incomplete  Blood culture (routine x 2)     Status: None (Preliminary result)   Collection Time: 03/20/15 11:00 PM  Result Value Ref Range Status   Specimen Description BLOOD LEFT HAND  Final   Special Requests BOTTLES DRAWN AEROBIC ONLY 2CC  Final   Culture   Final           BLOOD CULTURE RECEIVED NO GROWTH TO DATE CULTURE WILL BE HELD FOR 5 DAYS BEFORE ISSUING A FINAL NEGATIVE REPORT Performed at [facility]    Report Status PENDING  Incomplete  MRSA PCR Screening     Status: None   Collection Time: 03/21/15  2:49 AM  Result Value Ref Range Status   MRSA by PCR NEGATIVE NEGATIVE Final    Comment:        The GeneXpert MRSA Assay (FDA approved for NASAL specimens only), is one component of a comprehensive MRSA colonization surveillance program. It is not intended to diagnose MRSA infection nor to guide or monitor treatment for MRSA infections.     Radiology Reports Ct Head Wo Contrast  03/21/2015   CLINICAL DATA:  Altered mental status.  EXAM: CT HEAD WITHOUT CONTRAST  TECHNIQUE: Contiguous axial images were obtained from the base of the skull through the vertex without intravenous contrast.  COMPARISON:  Head CT 01/09/2013  FINDINGS: Generalized cerebral and cerebellar volume loss. Stable periventricular white matter change. Remote lacunar infarcts in the left caudate and thalamus. No  intracranial hemorrhage, mass effect, or midline shift. No hydrocephalus. The basilar cisterns are patent. No evidence of acute ischemia. No intracranial fluid collection. Calvarium is intact. Included paranasal sinuses and mastoid air cells are well aerated.  IMPRESSION: Stable atrophy and chronic small vessel ischemic change. No acute intracranial abnormality.   Electronically Signed   By: Rubye OaksMelanie  Ehinger M.D.   On: 03/21/2015 00:38   Dg Chest Port 1 View  03/20/2015   CLINICAL DATA:  Low oxygen saturation.  EXAM: PORTABLE CHEST - 1 VIEW  COMPARISON:  Chest radiograph 01/14/2013.  FINDINGS: Low lung volumes. Suspected cardiomegaly. Vascular congestion without overt failure. RIGHT lower lung zone opacity could represent pneumonia. Difficult to evaluate for pleural effusion. Vascular calcification. Osteopenia.  IMPRESSION: Suspected RIGHT lower lobe infiltrate.   Electronically Signed   By: Davonna BellingJohn  Curnes M.D.   On: 03/20/2015 23:43    CBC  Recent Labs Lab 03/20/15 2301 03/22/15 0325  WBC 7.6 8.0  HGB 14.0 10.5*  HCT 42.6 32.2*  PLT 215 182  MCV 79.6 78.7  MCH 26.2 25.7*  MCHC 32.9 32.6  RDW 15.6* 15.7*  LYMPHSABS 0.7  --   MONOABS 0.4  --   EOSABS 0.1  --   BASOSABS 0.0  --     Chemistries   Recent Labs Lab 03/20/15 2301 03/21/15 0403 03/22/15 0325  NA 137 139 140  K 4.5 4.4 4.2  CL 99* 102 105  CO2 27 27 24   GLUCOSE 201* 249* 221*  BUN 18 17 16   CREATININE 1.34* 1.40* 1.29*  CALCIUM 9.2 8.5* 8.3*  AST 27  --   --   ALT 20  --   --  ALKPHOS 81  --   --   BILITOT 0.7  --   --    ------------------------------------------------------------------------------------------------------------------ estimated creatinine clearance is 42.8 mL/min (by C-G formula based on Cr of 1.29). ------------------------------------------------------------------------------------------------------------------ No results for input(s): HGBA1C in the last 72  hours. ------------------------------------------------------------------------------------------------------------------ No results for input(s): CHOL, HDL, LDLCALC, TRIG, CHOLHDL, LDLDIRECT in the last 72 hours. ------------------------------------------------------------------------------------------------------------------ No results for input(s): TSH, T4TOTAL, T3FREE, THYROIDAB in the last 72 hours.  Invalid input(s): FREET3 ------------------------------------------------------------------------------------------------------------------ No results for input(s): VITAMINB12, FOLATE, FERRITIN, TIBC, IRON, RETICCTPCT in the last 72 hours.  Coagulation profile No results for input(s): INR, PROTIME in the last 168 hours.  No results for input(s): DDIMER in the last 72 hours.  Cardiac Enzymes No results for input(s): CKMB, TROPONINI, MYOGLOBIN in the last 168 hours.  Invalid input(s): CK ------------------------------------------------------------------------------------------------------------------ Invalid input(s): POCBNP    Athenia Rys D.O. on 03/22/2015 at 11:11 AM  Between 7am to 7pm - Pager - [phone]  After 7pm go to [email or web address] - password [email or web address]  And look for the night coverage person covering for me after hours  [facility] Office  [phone]

## 2015-03-22 NOTE — Clinical Social Work Note (Signed)
Clinical Social Work Assessment  Patient Details  Name: Valerie Rodriguez MRN: [id number] Date of Birth: 09/14/1951  Date of referral:  03/21/15               Reason for consult:  Other (Comment Required) ([facility]                Permission sought to share information with:  Family Supports, Oceanographeracility Contact Representative Permission granted to share information::  Yes, Designer, fashion/clothingVerbal Permission Granted (Patient nodded her head Yes)  Name::        Agency::     Relationship::     Contact Information:     Housing/Transportation Living arrangements for the past 2 months:  Skilled Building surveyorursing Facility Source of Information:  Patient, Adult Children Patient Interpreter Needed:  None Criminal Activity/Legal Involvement Pertinent to Current Situation/Hospitalization:  No - Comment as needed Significant Relationships:  Adult Children Lives with:  Facility Resident Do you feel safe going back to the place where you live?  Yes Need for family participation in patient care:  Yes (Comment)  Care giving concerns:  No Caregiving concerns given by patient as she is deaf/mute. She wrote on paper her assent to [facility] medically [facility].  [facility] and wants return there.  Social Worker assessment / plan:  64 year old female- [facility].  Patient is is deaf/mute and can read lips somewhat but also can communicate in writing.  Patient and her daughter both indicate a desire to return to the SNF when medically stable per MD.  Clovis CaoFl2 will be completed and placed on chart for MD's signature. Will monitor for stability per MD.  Employment status:  Disabled (Comment on whether or not currently receiving Disability) Insurance information:  Managed Medicare PT Recommendations:  Skilled Nursing Facility Information / Referral to community resources:  Skilled Nursing  Facility  Patient/Family's Response to care:  Unable to fully assess by patient however- she indicated in writing that she was "OK and had no questions or needs".  Daughter also denied any concerns or questions.  Requested information re: patient's insurance and how it relates to bed hold at the SNF. CSW provide this information to daughter.  Patient/Family's Understanding of and Emotional Response to Diagnosis, Current Treatment, and Prognosis:  Unable to really ascertain patient's understanding of and emotional response to diagnosis etc- however- she appeared relaxed and nodded yes when asked if she was feeling better. Daughter was able to verbalize her understanding of patient's current medical issues and treatment plan and stated she would talk to MD if she had further medical questions.  Emotional Assessment Appearance:  Appears stated age Attitude/Demeanor/Rapport:  Unable to Assess (Patient is deaf/mute- however able to write on paper or nod her head yes/no) Affect (typically observed):   (Patient is deaf/mute- however- she is able to nod her head yes/no  and can write in paper.  ) Orientation:  Oriented to Self, Oriented to Place, Oriented to  Time, Oriented to Situation (Patient is deaf/mute) Alcohol / Substance use:  Never Used Psych involvement (Current and /or in the community):  No (Comment)  Discharge Needs  Concerns to be addressed:  Care Coordination Readmission within the last 30 days:   No Current discharge risk:  None Barriers to Discharge:  No Barriers Identified   [name], Ayianna Darnold T, LCSW 03/22/2015, 12:50 PM

## 2015-03-23 ENCOUNTER — Inpatient Hospital Stay (HOSPITAL_COMMUNITY): Payer: Medicare Other

## 2015-03-23 ENCOUNTER — Encounter (HOSPITAL_COMMUNITY): Payer: Self-pay | Admitting: Radiology

## 2015-03-23 DIAGNOSIS — E118 Type 2 diabetes mellitus with unspecified complications: Secondary | ICD-10-CM

## 2015-03-23 DIAGNOSIS — L8990 Pressure ulcer of unspecified site, unspecified stage: Secondary | ICD-10-CM

## 2015-03-23 DIAGNOSIS — J189 Pneumonia, unspecified organism: Secondary | ICD-10-CM

## 2015-03-23 DIAGNOSIS — R1310 Dysphagia, unspecified: Secondary | ICD-10-CM

## 2015-03-23 DIAGNOSIS — A419 Sepsis, unspecified organism: Principal | ICD-10-CM

## 2015-03-23 LAB — GLUCOSE, CAPILLARY
GLUCOSE-CAPILLARY: 164 mg/dL — AB (ref 65–99)
GLUCOSE-CAPILLARY: 197 mg/dL — AB (ref 65–99)
Glucose-Capillary: 190 mg/dL — ABNORMAL HIGH (ref 65–99)
Glucose-Capillary: 217 mg/dL — ABNORMAL HIGH (ref 65–99)

## 2015-03-23 LAB — BASIC METABOLIC PANEL
Anion gap: 8 (ref 5–15)
BUN: 13 mg/dL (ref 6–20)
CHLORIDE: 105 mmol/L (ref 101–111)
CO2: 27 mmol/L (ref 22–32)
Calcium: 8.7 mg/dL — ABNORMAL LOW (ref 8.9–10.3)
Creatinine, Ser: 1.13 mg/dL — ABNORMAL HIGH (ref 0.44–1.00)
GFR calc Af Amer: 59 mL/min — ABNORMAL LOW (ref >60)
GFR calc non Af Amer: 51 mL/min — ABNORMAL LOW (ref >60)
GLUCOSE: 239 mg/dL — AB (ref 65–99)
Potassium: 4.2 mmol/L (ref 3.5–5.1)
SODIUM: 140 mmol/L (ref 135–145)

## 2015-03-23 LAB — CBC
HCT: 32 % — ABNORMAL LOW (ref 36.0–46.0)
Hemoglobin: 10.4 g/dL — ABNORMAL LOW (ref 12.0–15.0)
MCH: 25.7 pg — ABNORMAL LOW (ref 26.0–34.0)
MCHC: 32.5 g/dL (ref 30.0–36.0)
MCV: 79 fL (ref 78.0–100.0)
PLATELETS: 190 10*3/uL (ref 150–400)
RBC: 4.05 MIL/uL (ref 3.87–5.11)
RDW: 15.9 % — ABNORMAL HIGH (ref 11.5–15.5)
WBC: 7.3 10*3/uL (ref 4.0–10.5)

## 2015-03-23 MED ORDER — IOHEXOL 350 MG/ML SOLN
100.0000 mL | Freq: Once | INTRAVENOUS | Status: AC | PRN
  Administered 2015-03-23: 100 mL via INTRAVENOUS

## 2015-03-23 MED ORDER — VANCOMYCIN HCL IN DEXTROSE 750-5 MG/150ML-% IV SOLN
750.0000 mg | Freq: Two times a day (BID) | INTRAVENOUS | Status: DC
  Administered 2015-03-23 – 2015-03-26 (×6): 750 mg via INTRAVENOUS
  Filled 2015-03-23 (×8): qty 150

## 2015-03-23 MED ORDER — SODIUM CHLORIDE 0.9 % IV SOLN: INTRAVENOUS | Status: AC

## 2015-03-23 NOTE — Progress Notes (Signed)
Nutrition Brief Note  Patient identified due to a low Braden score   Wt Readings from Last 15 Encounters:  03/22/15 207 lb 2.3 oz (93.961 kg)  03/13/13 154 lb 4 oz (69.967 kg)  01/11/13 95 lb 3.8 oz (43.2 kg)  12/30/12 127 lb (57.607 kg)  09/16/12 147 lb (66.679 kg)  12/21/11 133 lb (60.328 kg)  12/14/11 128 lb (58.06 kg)  07/24/10 183 lb (83.008 kg)  07/12/10 175 lb (79.379 kg)  02/07/10 186 lb (84.369 kg)  01/04/10 184 lb (83.462 kg)  09/27/09 183 lb (83.008 kg)  04/29/09 178 lb (80.74 kg)  02/18/09 181 lb (82.101 kg)  10/29/08 172 lb (78.019 kg)    Body mass index is 44.81 kg/(m^2). Patient meets criteria for Morbid Obesity based on current BMI.   Current diet order is Dysphagia 1 with nectar-thick liquids, patient is consuming approximately 50-100% of meals at this time. Labs and medications reviewed.   No nutrition interventions warranted at this time. If nutrition issues arise, please consult RD.   Ian Malkineanne Barnett RD, LDN Inpatient Clinical Dietitian Pager: [phone] After Hours Pager: [phone]

## 2015-03-23 NOTE — Progress Notes (Signed)
Pt alert to self, place, situation, pt deaf can read lips and signs.  Pt had FEES done this am and found to cough up blood in trachea, Dr. Arthor CaptainElmahi notified and assessed pt, pt now on dys 1 diet with nector liquid, meds whole in apple sauce, pt put on IV vanc, no c/o pain, vss, pt stable, O2 on room air 96% 2L humidified O2 PRN, pt stable

## 2015-03-23 NOTE — Progress Notes (Signed)
At 1520. Introduced self to pt.  Call bell at reach,  Instructed to call for assistance.  Pt nodded head yes.  Will continue to monitor.  Amanda PeaNellie Kelin Borum, Charity fundraiserN.

## 2015-03-23 NOTE — Progress Notes (Signed)
ANTIBIOTIC CONSULT NOTE - Follow-up  Pharmacy Consult for Vanco/Zosyn Indication: pneumonia  No Known Allergies  Patient Measurements: Height: 4\' 9"  (144.8 cm) Weight: 207 lb 2.3 oz (93.961 kg) IBW/kg (Calculated) : 38.6 Adjusted Body Weight:    Vital Signs: Temp: 98 F (36.7 C) (06/08 1030) Temp Source: Oral (06/08 1030) BP: 169/81 mmHg (06/08 1030) Pulse Rate: 79 (06/08 1030) Intake/Output from previous day: 06/07 0701 - 06/08 0700 In: 898 [P.O.:598; IV Piggyback:300] Out: -  Intake/Output from this shift:    Labs:  Recent Labs  03/20/15 2301 03/21/15 0403 03/22/15 0325 03/23/15 0411  WBC 7.6  --  8.0 7.3  HGB 14.0  --  10.5* 10.4*  PLT 215  --  182 190  CREATININE 1.34* 1.40* 1.29* 1.13*   Estimated Creatinine Clearance: 48.9 mL/min (by C-G formula based on Cr of 1.13). No results for input(s): VANCOTROUGH, VANCOPEAK, VANCORANDOM, GENTTROUGH, GENTPEAK, GENTRANDOM, TOBRATROUGH, TOBRAPEAK, TOBRARND, AMIKACINPEAK, AMIKACINTROU, AMIKACIN in the last 72 hours.   Microbiology:   Medical History: Past Medical History  Diagnosis Date  . Hypertension   . Hyperlipemia   . Deaf-mutism   . Glaucoma     Dr Eulah Pontashwell  . Diabetes mellitus     Type II, sees Dr. Candie ChromanBindubal Balen  . Allergy   . Asthma     sees Dr Kettering CallasSharma  . Stroke 11-05-10    sees Dr. Pearlean BrownieSethi  . Chronic kidney disease     sees Dr. Camille Balynthia Dunham   . PVD (peripheral vascular disease)     sees Dr. Hart RochesterLawson   . PAD (peripheral artery disease)     Assessment: 64yo female presents w/ low O2 sats and AMS from SNF, CXR shows possible RLL infiltrate, to begin IV ABX  Infectious Disease: Abx for sepsis secondary to HCAP/UTI. RLL infiltrate. Tmax 104.2>100.5 currently. WBC 7.3. CrCl 48 Zosyn 6/6>> Vanco 6/6>>  6/5: UCx: Ecoli 6/5: BC x 2>>  Goal of Therapy:  Vancomycin trough level 15-20 mcg/ml  Plan:  Increase Vancomycin to 750mg  Iv q12h with improved renal function Zosyn 3.375g IV q8hr Vanco trough  at steady state after 3-5 doses. F/u to resume Celexa when mental status improves.   Nykira Reddix S. Merilynn Finlandobertson, PharmD, BCPS Clinical Staff Pharmacist Pager [phone]  Misty Stanleyobertson, Vashti Bolanos Stillinger 03/23/2015,11:14 AM

## 2015-03-23 NOTE — Procedures (Signed)
Objective Swallowing Evaluation: Other (Comment) (FEES)  Patient Details  Name: Valerie Rodriguez MRN: [id number] Date of Birth: Mar 02, 1951  Today's Date: 03/23/2015 Time: SLP Start Time (ACUTE ONLY): 1010-SLP Stop Time (ACUTE ONLY): 1040 SLP Time Calculation (min) (ACUTE ONLY): 30 min  Past Medical History:  Past Medical History  Diagnosis Date  . Hypertension   . Hyperlipemia   . Deaf-mutism   . Glaucoma     Dr Eulah Pont  . Diabetes mellitus     Type II, sees Dr. Candie Chroman  . Allergy   . Asthma     sees Dr Eupora Callas  . Stroke 11-05-10    sees Dr. Pearlean Brownie  . Chronic kidney disease     sees Dr. Camille Bal   . PVD (peripheral vascular disease)     sees Dr. Hart Rochester   . PAD (peripheral artery disease)    Past Surgical History:  Past Surgical History  Procedure Laterality Date  . Cholecystectomy    . Abdominal hysterectomy      oophorectomy 1976  . Rotator cuff repair  2004  . Colonoscopy  03-30-09    per Dr. Marina Goodell, poor prep, repeat one yr    HPI:  Other Pertinent Information: 64 y.o. female with h/o deafness, prior stroke resulting in dysphagia (on puree diet) admitted with  AMS. Work up demonstrates RLL PNA on CXR.BSE 01/15/13 with audible swallow, no s/s aspiration and regular thin recommended. CT No acute intracranial abnormality. CXR Suspected RIGHT lower lobe infiltrate.  No Data Recorded  Assessment / Plan / Recommendation CHL IP CLINICAL IMPRESSIONS 03/23/2015  Therapy Diagnosis (None)  Clinical Impression Pt demonstrates moderate oral and oropharyngeal dysphagia with poor coordination of breathing and swallow with open mouth posture, decreased oral control of bolus with rapid spill to oropharynx. The swallow is very strong, but initiation is delayed with frank penetration to cords with nectar, without aspriation. With thin boluses, pt did have sensed aspiration before the swallow as well as questionable silent aspiration events (pt unable to cough forcefully on  command). When coughing was elicited, pt expectorated large amount of bright red blood x2. Study terminated, RN and MD made aware via video. Of note, No bleeding occured in the nasopharynx or oropharynx during this exam. Blood definitively came from the trachea. Recommend pt consume a pureed diet with nectar thickened liquids. Will need assist for small single sips to improve breathing/swallow timing.       CHL IP TREATMENT RECOMMENDATION 03/23/2015  Treatment Recommendations Therapy as outlined in treatment plan below     CHL IP DIET RECOMMENDATION 03/23/2015  SLP Diet Recommendations Dysphagia 1 (Puree);Nectar  Liquid Administration via (None)  Medication Administration Whole meds with puree  Compensations Slow rate;Small sips/bites  Postural Changes and/or Swallow Maneuvers (None)     CHL IP OTHER RECOMMENDATIONS 03/23/2015  Recommended Consults (None)  Oral Care Recommendations Oral care BID  Other Recommendations Order thickener from pharmacy     CHL IP FOLLOW UP RECOMMENDATIONS 03/22/2015  Follow up Recommendations (No Data)     CHL IP FREQUENCY AND DURATION 03/23/2015  Speech Therapy Frequency (ACUTE ONLY) min 2x/week  Treatment Duration 2 weeks     Pertinent Vitals/Pain NA    SLP Swallow Goals No flowsheet data found.  No flowsheet data found.    CHL IP REASON FOR REFERRAL 03/23/2015  Reason for Referral Objectively evaluate swallowing function     CHL IP ORAL PHASE 03/23/2015  Lips (None)  Tongue (None)  Mucous membranes (None)  Nutritional status (  None)  Other (None)  Oxygen therapy (None)  Oral Phase Impaired  Oral - Pudding Teaspoon (None)  Oral - Pudding Cup (None)  Oral - Honey Teaspoon (None)  Oral - Honey Cup (None)  Oral - Honey Syringe (None)  Oral - Nectar Teaspoon (None)  Oral - Nectar Cup (None)  Oral - Nectar Straw (None)  Oral - Nectar Syringe (None)  Oral - Ice Chips (None)  Oral - Thin Teaspoon (None)  Oral - Thin Cup (None)  Oral - Thin Straw (None)   Oral - Thin Syringe (None)  Oral - Puree (None)  Oral - Mechanical Soft (None)  Oral - Regular (None)  Oral - Multi-consistency (None)  Oral - Pill (None)  Oral Phase - Comment (None)      CHL IP PHARYNGEAL PHASE 03/23/2015  Pharyngeal Phase Impaired  Pharyngeal - Pudding Teaspoon (None)  Penetration/Aspiration details (pudding teaspoon) (None)  Pharyngeal - Pudding Cup (None)  Penetration/Aspiration details (pudding cup) (None)  Pharyngeal - Honey Teaspoon (None)  Penetration/Aspiration details (honey teaspoon) (None)  Pharyngeal - Honey Cup (None)  Penetration/Aspiration details (honey cup) (None)  Pharyngeal - Honey Syringe (None)  Penetration/Aspiration details (honey syringe) (None)  Pharyngeal - Nectar Teaspoon (None)  Penetration/Aspiration details (nectar teaspoon) (None)  Pharyngeal - Nectar Cup (None)  Penetration/Aspiration details (nectar cup) (None)  Pharyngeal - Nectar Straw (None)  Penetration/Aspiration details (nectar straw) (None)  Pharyngeal - Nectar Syringe (None)  Penetration/Aspiration details (nectar syringe) (None)  Pharyngeal - Ice Chips (None)  Penetration/Aspiration details (ice chips) (None)  Pharyngeal - Thin Teaspoon (None)  Penetration/Aspiration details (thin teaspoon) (None)  Pharyngeal - Thin Cup (None)  Penetration/Aspiration details (thin cup) (None)  Pharyngeal - Thin Straw (None)  Penetration/Aspiration details (thin straw) (None)  Pharyngeal - Thin Syringe (None)  Penetration/Aspiration details (thin syringe') (None)  Pharyngeal - Puree (None)  Penetration/Aspiration details (puree) (None)  Pharyngeal - Mechanical Soft (None)  Penetration/Aspiration details (mechanical soft) (None)  Pharyngeal - Regular (None)  Penetration/Aspiration details (regular) (None)  Pharyngeal - Multi-consistency (None)  Penetration/Aspiration details (multi-consistency) (None)  Pharyngeal - Pill (None)  Penetration/Aspiration details (pill) (None)   Pharyngeal Comment (None)      No flowsheet data found.  No flowsheet data found.        Harlon DittyBonnie Mysha Peeler, [place] CCC-SLP [phone]  Claudine MoutonDeBlois, Wilfred Siverson Caroline 03/23/2015, 10:55 AM

## 2015-03-23 NOTE — Progress Notes (Signed)
Unable to catch urine, pt is very incontinent.

## 2015-03-23 NOTE — Progress Notes (Signed)
[facility] PROGRESS NOTE  KORALYN PRESTAGE ZOX:[id number] DOB: January 07, 1951 DOA: 03/20/2015 PCP: No PCP Per Patient  Assessment/Plan: Sepsis secondary to HCAP (RLL PNA)/UTI Patient was febrile and tachycardic upon admission.  CXR showed suspected RLL infiltrates. Lactate level of 2.7. Suspected silent aspiration due to HCAP. Two episodes of hemoptysis during FEES, followed by blowing blood out of her nose.  Blood culture has no growth to present. Will continue to pend for five total days. Urine culture + e. Coli. MRSA by PCR Negative. Continue IV vancomycin. CTA of chest to evaluate hemoptysis.  Hemoptysis She had episode of hemoptysis while she is having the fees. Very clear this is coming from inside the lungs as below going through the true vocal cords. Patient was able to cough the blood out, will obtain CT scan, depending on the results will obtain pulmonary consult.  Acute renal insufficiency Creatinine decreased from 1.40 on admission to 1.13 on 03/23/15. Continue IV fluids  DM2 Weight gain of 76 lbs over the past 2 years Continue novoLOG and LANTUS. Start PT to increase activity level and potentially regain weight bearing ability.  Deafness Lip reads to some degree; communicates via sign language with daughter  Dysphagia Dysphagia due to previous stroke. CT of head on 03/20/15 showed stable atrophy and chronic small vessel ischemic change with no acute intracranial abnormality. Diet: nectar thick fluid consistency  Chronic debility According to her daughter patient is bedridden for the past 1 year. That was percent be secondary to sudden weight gain and peripheral neuropathy.  Code Status: full Family Communication: daughter is at bedside and is understanding of plan of care Disposition Plan: Remains as inpatient   Consultants:  None  Procedures:  None  Antibiotics:  Vancomycin  HPI/Subjective: NAREH MATZKE is a 64 y.o. female with a history of deafness  and stroke resulting in dysphagia presented with AMS and sepsis due to HCAP and UTI. Her daughter was in the room and provided much of the history and translated for the patient and providers. The patient stays at a nursing home. She experienced two episodes of coughing up frank blood during FEES this am. After FEES, she blew frank blood out of her nose. Her daughter denied any episodes of coughing or blowing out blood prior to the FEES. She has had an increase in weight from 130 lbs to 206 lbs in the last two years and is now considered non weight bearing.  CXR on 03/20/15 showed suspected right lower lobe infiltrate. CT of head on 03/20/15 showed stable atrophy and chronic small vessel ischemic change; no acute intracranial abnormality. Her blood culture has no growth to date. Her urine culture grew scherichia Coli. MRSA by PCR was negative. Her glucose 03/23/15 at 0411 was 239.  Objective: Filed Vitals:   03/23/15 1030  BP: 169/81  Pulse: 79  Temp: 98 F (36.7 C)  Resp: 20    Intake/Output Summary (Last 24 hours) at 03/23/15 1602 Last data filed at 03/23/15 0600  Gross per 24 hour  Intake    660 ml  Output      0 ml  Net    660 ml   Filed Weights   03/21/15 0229 03/22/15 0625  Weight: 94.8 kg (208 lb 15.9 oz) 93.961 kg (207 lb 2.3 oz)    Exam:   General:  Appears calm and comfortable.  Cardiovascular: RRR, no m/r/g. Lower extremity edema.  Respiratory: Patient unable to cough and clear upper airways. Difficult to auscultate. Upper airway turbulence.  Abdomen: soft, nt; mildly distended   Data Reviewed: Basic Metabolic Panel:  Recent Labs Lab 03/20/15 2301 03/21/15 0403 03/22/15 0325 03/23/15 0411  NA 137 139 140 140  K 4.5 4.4 4.2 4.2  CL 99* 102 105 105  CO2 27 27 24 27   GLUCOSE 201* 249* 221* 239*  BUN 18 17 16 13   CREATININE 1.34* 1.40* 1.29* 1.13*  CALCIUM 9.2 8.5* 8.3* 8.7*   Liver Function Tests:  Recent Labs Lab 03/20/15 2301  AST 27  ALT 20  ALKPHOS  81  BILITOT 0.7  PROT 7.6  ALBUMIN 3.2*   CBC:  Recent Labs Lab 03/20/15 2301 03/22/15 0325 03/23/15 0411  WBC 7.6 8.0 7.3  NEUTROABS 6.4  --   --   HGB 14.0 10.5* 10.4*  HCT 42.6 32.2* 32.0*  MCV 79.6 78.7 79.0  PLT 215 182 190    CBG:  Recent Labs Lab 03/22/15 1129 03/22/15 1626 03/22/15 2202 03/23/15 0856 03/23/15 1046  GLUCAP 144* 117* 159* 190* 164*    Recent Results (from the past 240 hour(s))  Urine culture     Status: None (Preliminary result)   Collection Time: 03/20/15 10:55 PM  Result Value Ref Range Status   Specimen Description URINE, CLEAN CATCH  Final   Special Requests NONE  Final   Colony Count   Final    >=100,000 COLONIES/ML Performed at [facility]    Culture   Final    ESCHERICHIA COLI Performed at [facility]    Report Status PENDING  Incomplete  Blood culture (routine x 2)     Status: None (Preliminary result)   Collection Time: 03/20/15 10:57 PM  Result Value Ref Range Status   Specimen Description BLOOD RIGHT ANTECUBITAL  Final   Special Requests BOTTLES DRAWN AEROBIC ONLY 2CC  Final   Culture   Final           BLOOD CULTURE RECEIVED NO GROWTH TO DATE CULTURE WILL BE HELD FOR 5 DAYS BEFORE ISSUING A FINAL NEGATIVE REPORT Performed at [facility]    Report Status PENDING  Incomplete  Blood culture (routine x 2)     Status: None (Preliminary result)   Collection Time: 03/20/15 11:00 PM  Result Value Ref Range Status   Specimen Description BLOOD LEFT HAND  Final   Special Requests BOTTLES DRAWN AEROBIC ONLY 2CC  Final   Culture   Final           BLOOD CULTURE RECEIVED NO GROWTH TO DATE CULTURE WILL BE HELD FOR 5 DAYS BEFORE ISSUING A FINAL NEGATIVE REPORT Performed at [facility]    Report Status PENDING  Incomplete  MRSA PCR Screening     Status: None   Collection Time: 03/21/15  2:49 AM  Result Value Ref Range Status   MRSA by PCR NEGATIVE NEGATIVE Final    Comment:        The GeneXpert  MRSA Assay (FDA approved for NASAL specimens only), is one component of a comprehensive MRSA colonization surveillance program. It is not intended to diagnose MRSA infection nor to guide or monitor treatment for MRSA infections.      Studies: CXR on 03/20/15 showed suspected right lower lobe infiltrate. CT of head on 03/20/15 showed stable atrophy and chronic small vessel ischemic change; no acute intracranial abnormality. Her blood culture has no growth to date. Her urine culture grew scherichia Coli. MRSA by PCR was negative. Her glucose 03/23/15 at 0411 was 239.  Scheduled  Meds: . sodium chloride   Intravenous STAT  . antiseptic oral rinse  7 mL Mouth Rinse BID  . atenolol  50 mg Oral Daily  . atorvastatin  80 mg Oral q1800  . azelastine  1 spray Each Nare BID  . budesonide-formoterol  2 puff Inhalation BID  . clopidogrel  75 mg Oral Daily  . docusate sodium  100 mg Oral Daily  . fluticasone  2 spray Each Nare Daily  . heparin  5,000 Units Subcutaneous 3 times per day  . hydrALAZINE  50 mg Oral BID  . insulin aspart  0-15 Units Subcutaneous TID WC  . insulin aspart  4 Units Subcutaneous TID WC  . insulin glargine  32 Units Subcutaneous QHS  . ipratropium-albuterol  3 mL Inhalation 4 times per day  . latanoprost  1 drop Both Eyes QHS  . montelukast  10 mg Oral QHS  . pantoprazole  40 mg Oral Daily  . piperacillin-tazobactam (ZOSYN)  IV  3.375 g Intravenous Q8H  . pregabalin  200 mg Oral BID  . vancomycin  750 mg Intravenous Q12H   Continuous Infusions:   Principal Problem:   HCAP (healthcare-associated pneumonia) Active Problems:   DM2 (diabetes mellitus, type 2)   Hearing loss   Sepsis   Right lower lobe pneumonia   Dysphagia   Pressure ulcer    Time spent: 45    Elenore Paddy PA-S [facility] Pager [phone]. If 7PM-7AM, please contact night-coverage at [email or web address], password [facility] 03/23/2015, 4:02 PM  LOS: 2 days      Addendum  Patient seen and  examined, chart and data base reviewed.  I agree with the above assessment and plan.  For full details please see Mrs. Elenore Paddy PA-S note.  I reviewed and amended the above note as appropriate.   Clint Lipps, MD [facility] Pager: [phone] 03/23/2015, 4:38 PM

## 2015-03-23 NOTE — Progress Notes (Signed)
Received from procedure via stretcher. Telemetry reactivated. Continued to monitor pt

## 2015-03-24 LAB — URINE CULTURE: Colony Count: >=100000

## 2015-03-24 LAB — GLUCOSE, CAPILLARY
Glucose-Capillary: 150 mg/dL — ABNORMAL HIGH (ref 65–99)
Glucose-Capillary: 184 mg/dL — ABNORMAL HIGH (ref 65–99)
Glucose-Capillary: 151 mg/dL — ABNORMAL HIGH (ref 65–99)
Glucose-Capillary: 154 mg/dL — ABNORMAL HIGH (ref 65–99)
Glucose-Capillary: 169 mg/dL — ABNORMAL HIGH (ref 65–99)

## 2015-03-24 MED ORDER — INSULIN GLARGINE 100 UNIT/ML ~~LOC~~ SOLN
35.0000 [IU] | Freq: Every day | SUBCUTANEOUS | Status: DC
  Administered 2015-03-24 – 2015-03-25 (×2): 35 [IU] via SUBCUTANEOUS
  Filled 2015-03-24 (×3): qty 0.35

## 2015-03-24 MED ORDER — SODIUM CHLORIDE 0.9 % IV SOLN
500.0000 mg | Freq: Three times a day (TID) | INTRAVENOUS | Status: DC
  Administered 2015-03-24 – 2015-03-26 (×5): 500 mg via INTRAVENOUS
  Filled 2015-03-24 (×9): qty 500

## 2015-03-24 MED ORDER — IPRATROPIUM-ALBUTEROL 0.5-2.5 (3) MG/3ML IN SOLN
3.0000 mL | Freq: Three times a day (TID) | RESPIRATORY_TRACT | Status: DC
  Administered 2015-03-24 – 2015-03-26 (×7): 3 mL via RESPIRATORY_TRACT
  Filled 2015-03-24 (×8): qty 3

## 2015-03-24 NOTE — Clinical Documentation Improvement (Signed)
PLEASE NOTE IF PRESENT ON ADMISSION  Pressure Ulcer is documented in the 03/23/15 progress note.  Please clarify site and stage of pressure ulcer and document findings in next progress note and include in discharge summary if applicable.  Stage  I  Pressure Ulcer   (reddening of the skin) Stage  II Pressure Ulcer  (blister open or unopened) Stage  III Pressure Ulcer (through all layers skin) Stage IV Pressure Ulcer   (through skin & underlying  muscle, tendons, and bones) Other Condition Cannot Clinically Determine   Thank You, Shellee Milo ,RN Clinical Documentation Specialist:  [phone]  [facility]- Health Information Management

## 2015-03-24 NOTE — Clinical Documentation Improvement (Signed)
Nutritional Consult notes Morbid Obesity with BMI of 44.  Please assess consult and render an opinion in next progress note and include in discharge summary if applicable.  _______Other Condition__________________ _______Cannot Clinically Determine   Thank You, Shellee Milo ,RN Clinical Documentation Specialist:  [phone]  [facility]- Health Information Management

## 2015-03-24 NOTE — Progress Notes (Signed)
Speech Language Pathology Treatment: Dysphagia  Patient Details Name: Valerie Rodriguez MRN: [id number] DOB: May 07, 1951 Today's Date: 03/24/2015 Time: 5038-8828 SLP Time Calculation (min) (ACUTE ONLY): 18 min  Assessment / Plan / Recommendation Clinical Impression   Pt has audible congestion upon SLP arrival, which does not clear with cued coughs, which are weak. Throughout intake there may be a questionable increase in congestion, however if any change it is minimal. Intermittent vocalizations reveal a clear vocal quality throughout intake. SLP provided [facility] assist for self-feeding with additional Mod cues for safe swallowing strategies. Reviewed results and recommendations from FEES on previous date.    HPI Other Pertinent Information: 64 y.o. female with h/o deafness, prior stroke resulting in dysphagia (on puree diet) admitted with  AMS. Work up demonstrates RLL PNA on CXR.BSE 01/15/13 with audible swallow, no s/s aspiration and regular thin recommended. CT No acute intracranial abnormality. CXR Suspected RIGHT lower lobe infiltrate.   Pertinent Vitals Pain Assessment: No/denies pain  SLP Plan  Continue with current plan of care    Recommendations Diet recommendations: Dysphagia 1 (puree);Nectar-thick liquid Liquids provided via: Cup Medication Administration: Whole meds with puree Supervision: Staff to assist with self feeding;Full supervision/cueing for compensatory strategies Compensations: Slow rate;Small sips/bites Postural Changes and/or Swallow Maneuvers: Seated upright 90 degrees       Oral Care Recommendations: Oral care BID Follow up Recommendations: Home health SLP Plan: Continue with current plan of care    Maxcine Ham, M.A. CCC-SLP [phone]  Maxcine Ham 03/24/2015, 10:53 AM

## 2015-03-24 NOTE — Progress Notes (Signed)
[facility] PROGRESS NOTE  [name]:[id number] DOB: 10-20-1950 DOA: 03/20/2015 PCP: No PCP Per Patient  Assessment/Plan:  Sepsis secondary to HCAP (RLL PNA)/UTI - Patient was febrile and tachycardic upon admission.  - CXR showed suspected RLL infiltrates. Lactate level of 2.7. Suspected silent aspiration due to HCAP. Two episodes of hemoptysis during FEES, followed by blowing blood out of her nose.  - Blood culture has no growth to present. Will continue to pend for five total days.  - MRSA by PCR Negative. - CTA chest showed no evidence of acute pulmonary thromboembolism.  Escherichia coli ESBL UTI - Urine culture + e. Coli and confirmed extended spectrum Beta-lactamase producer (ESBL).  -Switch from vanc/zosyn to primaxin due to culture showing e. Coli with ESBL.  Hemoptysis She had episode of hemoptysis while she is having the FEES. Very clear this is coming from inside the lungs as below going through the true vocal cords. Patient was able to cough the blood up to swallow. CTA showed results listed above under sepsis secondary to HCAP/UTI.  Acute renal insufficiency Creatinine decreased from 1.40 on admission to 1.13 on 03/23/15. Continue IV fluids  DM2 Weight gain of 76 lbs over the past 2 years Continue novoLOG and LANTUS. Start PT to increase activity level and potentially regain weight bearing ability.  Deafness Lip reads to some degree; communicates via sign language with daughter  Dysphagia Dysphagia due to previous stroke. CT of head on 03/20/15 showed stable atrophy and chronic small vessel ischemic change with no acute intracranial abnormality. Diet: dysphagia 1 diet, nectar thick fluid consistency.  Chronic debility According to her daughter patient is bedridden for the past 1 year. That was stated by daughter to be secondary to sudden weight gain and peripheral neuropathy.  Code Status: full Family Communication: daughter is at bedside and is  understanding of plan of care Disposition Plan: Remains as inpatient   Consultants:  None  Procedures:  None  Antibiotics:  Primaxin  HPI/Subjective: Valerie Rodriguez is a 64 y.o. 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 Her daughter was in the room and provided much of the history and translated for the patient and providers. The patient stays at a nursing home. She experienced two episodes of coughing up frank blood during FEES this am. After FEES, she blew frank blood out of her nose. Her daughter denied any episodes of coughing or blowing out blood prior to the FEES. She has had an increase in weight from 130 lbs to 206 lbs in the last two years and is now considered non weight bearing.  CXR on 03/20/15 showed suspected right lower lobe infiltrate. CT of head on 03/20/15 showed stable atrophy and chronic small vessel ischemic change; no acute intracranial abnormality. Her blood culture has no growth to date. Her urine culture grew scherichia Coli. MRSA by PCR was negative. Her glucose 03/23/15 at 0411 was 239.  Objective: Filed Vitals:   03/24/15 1153  BP: 145/59  Pulse: 79  Temp: 98 F (36.7 C)  Resp: 18    Intake/Output Summary (Last 24 hours) at 03/24/15 1249 Last data filed at 03/24/15 1211  Gross per 24 hour  Intake    715 ml  Output      0 ml  Net    715 ml   Filed Weights   03/21/15 0229 03/22/15 0625 03/24/15 0516  Weight: 94.8 kg (208 lb 15.9 oz)  93.961 kg (207 lb 2.3 oz) 93.1 kg (205 lb 4 oz)    Exam:   General:  Appears calm and comfortable.  HEENT: suctioned her mouth and into her oropharynx without presence of a gag reflex.  Cardiovascular: RRR, no m/r/g. Lower extremity edema.  Respiratory: Patient unable to cough and clear upper airways. Upper airway turbulence.  Abdomen: soft, nt; mildly distended   Data Reviewed: Basic Metabolic Panel:  Recent Labs Lab  03/20/15 2301 03/21/15 0403 03/22/15 0325 03/23/15 0411  NA 137 139 140 140  K 4.5 4.4 4.2 4.2  CL 99* 102 105 105  CO2 GLUCOSE 201* 249* 221* 239*  BUN CREATININE 1.34* 1.40* 1.29* 1.13*  CALCIUM 9.2 8.5* 8.3* 8.7*   Liver Function Tests:  Recent Labs Lab 03/20/15 2301  AST 27  ALT 20  ALKPHOS 81  BILITOT 0.7  PROT 7.6  ALBUMIN 3.2*   CBC:  Recent Labs Lab 03/20/15 2301 03/22/15 0325 03/23/15 0411  WBC 7.6 8.0 7.3  NEUTROABS 6.4  --   --   HGB 14.0 10.5* 10.4*  HCT 42.6 32.2* 32.0*  MCV 79.6 78.7 79.0  PLT 215 182 190    CBG:  Recent Labs Lab 03/23/15 0856 03/23/15 1046 03/23/15 1631 03/23/15 2157 03/24/15 0554  GLUCAP 190* 164* 217* 197* 150*    Recent Results (from the past 240 hour(s))  Urine culture     Status: None   Collection Time: 03/20/15 10:55 PM  Result Value Ref Range Status   Specimen Description URINE, CLEAN CATCH  Final   Special Requests NONE  Final   Colony Count   Final    >=100,000 COLONIES/ML Performed at [facility]    Culture   Final    ESCHERICHIA COLI Note: Confirmed Extended Spectrum Beta-Lactamase Producer (ESBL) CRITICAL RESULT CALLED TO, READ BACK BY AND VERIFIED WITH: JUANITA FUTRELL @ 7:59 AM 03/24/15 BY DWEEKS Performed at [facility]    Report Status 03/24/2015 FINAL  Final   Organism ID, Bacteria ESCHERICHIA COLI  Final      Susceptibility   Escherichia coli - MIC*    AMPICILLIN >=32 RESISTANT Resistant     CEFAZOLIN >=64 RESISTANT Resistant     CEFTRIAXONE RESISTANT      CIPROFLOXACIN >=4 RESISTANT Resistant     GENTAMICIN <=1 SENSITIVE Sensitive     LEVOFLOXACIN >=8 RESISTANT Resistant     NITROFURANTOIN <=16 SENSITIVE Sensitive     TOBRAMYCIN <=1 SENSITIVE Sensitive     TRIMETH/SULFA <=20 SENSITIVE Sensitive     IMIPENEM <=0.25 SENSITIVE Sensitive     PIP/TAZO <=4 SENSITIVE Sensitive     * ESCHERICHIA COLI  Blood culture (routine x 2)     Status: None  (Preliminary result)   Collection Time: 03/20/15 10:57 PM  Result Value Ref Range Status   Specimen Description BLOOD RIGHT ANTECUBITAL  Final   Special Requests BOTTLES DRAWN AEROBIC ONLY 2CC  Final   Culture   Final           BLOOD CULTURE RECEIVED NO GROWTH TO DATE CULTURE WILL BE HELD FOR 5 DAYS BEFORE ISSUING A FINAL NEGATIVE REPORT Performed at [facility]    Report Status PENDING  Incomplete  Blood culture (routine x 2)     Status: None (Preliminary result)   Collection Time: 03/20/15 11:00 PM  Result Value Ref Range Status   Specimen Description BLOOD LEFT HAND  Final  Special Requests BOTTLES DRAWN AEROBIC ONLY 2CC  Final   Culture   Final           BLOOD CULTURE RECEIVED NO GROWTH TO DATE CULTURE WILL BE HELD FOR 5 DAYS BEFORE ISSUING A FINAL NEGATIVE REPORT Performed at [facility]    Report Status PENDING  Incomplete  MRSA PCR Screening     Status: None   Collection Time: 03/21/15  2:49 AM  Result Value Ref Range Status   MRSA by PCR NEGATIVE NEGATIVE Final    Comment:        The GeneXpert MRSA Assay (FDA approved for NASAL specimens only), is one component of a comprehensive MRSA colonization surveillance program. It is not intended to diagnose MRSA infection nor to guide or monitor treatment for MRSA infections.      Studies: CXR on 03/20/15 showed suspected right lower lobe infiltrate. CT of head on 03/20/15 showed stable atrophy and chronic small vessel ischemic change; no acute intracranial abnormality. Her blood culture has no growth to date. Her urine culture grew escherichia Coli with confirmed extended spectrum Beta-lactamase producer (ESBL). MRSA by PCR was negative. Her glucose 03/23/15 at 0411 was 239. CT angio chest on 03/23/15 showed no evidence of acute pulmonary thromboembolism.The ascending aorta and aortic arch are small caliber but the descending aorta is normal in caliber. There is no significant stenosis in this may represent  pseudo-coarctation which can be associated with aortic valve abnormalities. Consider echocardiogram to further characterize. There are bilateral ill-defined and patchy pulmonary opacities in the lower lobes worrisome for an inflammatory process. There is also ground-glass in the right upper and middle lobes as well as soft tissue prominence in the right hilum. CT follow-up in 3 months is recommended to ensure resolution of these findings.  Scheduled Meds: . sodium chloride   Intravenous STAT  . antiseptic oral rinse  7 mL Mouth Rinse BID  . atenolol  50 mg Oral Daily  . atorvastatin  80 mg Oral q1800  . azelastine  1 spray Each Nare BID  . budesonide-formoterol  2 puff Inhalation BID  . clopidogrel  75 mg Oral Daily  . docusate sodium  100 mg Oral Daily  . fluticasone  2 spray Each Nare Daily  . heparin  5,000 Units Subcutaneous 3 times per day  . hydrALAZINE  50 mg Oral BID  . insulin aspart  0-15 Units Subcutaneous TID WC  . insulin aspart  4 Units Subcutaneous TID WC  . insulin glargine  32 Units Subcutaneous QHS  . ipratropium-albuterol  3 mL Inhalation TID  . latanoprost  1 drop Both Eyes QHS  . montelukast  10 mg Oral QHS  . pantoprazole  40 mg Oral Daily  . pregabalin  200 mg Oral BID  . vancomycin  750 mg Intravenous Q12H   Continuous Infusions:   Principal Problem:   HCAP (healthcare-associated pneumonia) Active Problems:   DM2 (diabetes mellitus, type 2)   Hearing loss   Sepsis   Right lower lobe pneumonia   Dysphagia   Pressure ulcer    Time spent: 45    Elenore Paddy PA-S [facility] Pager [phone]. If 7PM-7AM, please contact night-coverage at [email or web address], password [facility] 03/24/2015, 12:49 PM  LOS: 3 days      Addendum  Patient seen and examined, chart and data base reviewed.  I agree with the above assessment and plan.  For full details please see Mrs. Elenore Paddy PA-S note.  I reviewed and amended the  above note as  appropriate.   Clint Lipps, MD [facility] Pager: [phone] 03/24/2015, 12:49 PM

## 2015-03-24 NOTE — Clinical Social Work Note (Signed)
CSW reviewed chart. Once pt is medically stable she can return back to [facility].   Carnell Beavers, MSW, LCSWA [phone]

## 2015-03-24 NOTE — Progress Notes (Signed)
ANTIBIOTIC CONSULT NOTE - Follow-up  Pharmacy Consult for Vanco and new Primaxin Indication: pneumonia  No Known Allergies  Patient Measurements: Height: 4\' 9"  (144.8 cm) Weight: 205 lb 4 oz (93.1 kg) IBW/kg (Calculated) : 38.6 Adjusted Body Weight:    Vital Signs: Temp: 100.2 F (37.9 C) (06/09 0516) Temp Source: Oral (06/09 0516) BP: 143/59 mmHg (06/09 0516) Pulse Rate: 84 (06/09 0516) Intake/Output from previous day: 06/08 0701 - 06/09 0700 In: 380 [P.O.:380] Out: -  Intake/Output from this shift: Total I/O In: 120 [P.O.:120] Out: -   Labs:  Recent Labs  03/22/15 0325 03/23/15 0411  WBC 8.0 7.3  HGB 10.5* 10.4*  PLT 182 190  CREATININE 1.29* 1.13*   Estimated Creatinine Clearance: 48.6 mL/min (by C-G formula based on Cr of 1.13). No results for input(s): VANCOTROUGH, VANCOPEAK, VANCORANDOM, GENTTROUGH, GENTPEAK, GENTRANDOM, TOBRATROUGH, TOBRAPEAK, TOBRARND, AMIKACINPEAK, AMIKACINTROU, AMIKACIN in the last 72 hours.   Microbiology:   Medical History: Past Medical History  Diagnosis Date  . Hypertension   . Hyperlipemia   . Deaf-mutism   . Glaucoma     Dr Valerie Rodriguez  . Diabetes mellitus     Type II, sees Dr. Candie Rodriguez  . Allergy   . Asthma     sees Dr Valerie Rodriguez  . Stroke 11-05-10    sees Dr. Pearlean Rodriguez  . Chronic kidney disease     sees Dr. Camille Rodriguez   . PVD (peripheral vascular disease)     sees Dr. Hart Rodriguez   . PAD (peripheral artery disease)     Assessment: 64yo female presents w/ low O2 sats and AMS from SNF, CXR shows possible RLL infiltrate, to begin IV ABX  Infectious Disease: Abx for sepsis secondary to HCAP/UTI. RLL infiltrate. Tmax 104.2>100.6. WBC 7.3. CrCl 48. ESBL + Ecoli in urine Zosyn 6/6>> Vanco 6/6>>  6/5: UCx: Ecoli ESBL + 6/5: BC x 2>>  Goal of Therapy:  Vancomycin trough level 15-20 mcg/ml  Plan:  Change Zosyn to Primaxin 500mg  IV q8hr. Increase Vancomycin to 750mg  IV q12h-done 6/8 Vanco trough at steady state after  3-5 doses. F/u to resume Celexa when mental status improves.  Valerie Rodriguez S. Valerie Rodriguez, PharmD, BCPS Clinical Staff Pharmacist Pager [phone]  Valerie Rodriguez 03/24/2015,9:36 AM

## 2015-03-25 LAB — BASIC METABOLIC PANEL
Anion gap: 8 (ref 5–15)
BUN: 13 mg/dL (ref 6–20)
CO2: 29 mmol/L (ref 22–32)
Calcium: 9.1 mg/dL (ref 8.9–10.3)
Chloride: 108 mmol/L (ref 101–111)
Creatinine, Ser: 1.09 mg/dL — ABNORMAL HIGH (ref 0.44–1.00)
GFR calc non Af Amer: 53 mL/min — ABNORMAL LOW (ref >60)
Glucose, Bld: 93 mg/dL (ref 65–99)
Potassium: 3.9 mmol/L (ref 3.5–5.1)
Sodium: 145 mmol/L (ref 135–145)

## 2015-03-25 LAB — GLUCOSE, CAPILLARY
GLUCOSE-CAPILLARY: 93 mg/dL (ref 65–99)
GLUCOSE-CAPILLARY: 158 mg/dL — AB (ref 65–99)
Glucose-Capillary: 197 mg/dL — ABNORMAL HIGH (ref 65–99)
Glucose-Capillary: 184 mg/dL — ABNORMAL HIGH (ref 65–99)
Glucose-Capillary: 202 mg/dL — ABNORMAL HIGH (ref 65–99)

## 2015-03-25 MED ORDER — FUROSEMIDE 10 MG/ML IJ SOLN
40.0000 mg | Freq: Once | INTRAMUSCULAR | Status: AC
  Administered 2015-03-25: 40 mg via INTRAVENOUS
  Filled 2015-03-25: qty 4

## 2015-03-25 NOTE — Progress Notes (Addendum)
[facility] PROGRESS NOTE  Valerie Rodriguez JXB:[id number] DOB: 1951/07/14 DOA: 03/20/2015 PCP: No PCP Per Patient    HPI/Subjective: Patient is deaf,  she can read questions. I have asked her to cough, she had very minimal cough effort. I used the suction to help her to get the secretions out she did not have gag reflex. Continue current Abx for today and D.C to the SNF in AM  Assessment/Plan:  Sepsis secondary to HCAP (RLL PNA)/UTI - Patient was febrile and tachycardic upon admission.  - CXR showed suspected RLL infiltrates. Lactate level of 2.7. Suspected silent aspiration due to HCAP. Two episodes of hemoptysis during FEES, followed by blowing blood out of her nose.  - Blood culture has no growth to present. Will continue to pend for five total days.  - MRSA by PCR Negative. - CTA chest showed no evidence of acute pulmonary thromboembolism.  Escherichia coli ESBL UTI - Urine culture + e. Coli and confirmed extended spectrum Beta-lactamase producer (ESBL).  -Switch from vanc/zosyn to primaxin due to culture showing e. Coli with ESBL. -Likely to give fosfomycin in the morning and send her back to the nursing home.  Hemoptysis She had episode of hemoptysis while she is having the FEES. True hemoptysis as blood was coming from between the true vocal cords CTA showed results listed above under sepsis secondary to HCAP/UTI.  Acute renal insufficiency Creatinine decreased from 1.40 on admission to 1.13 on 03/23/15. On Lasix because of fluid overload. Repeat Lasix today  DM2 Weight gain of 76 lbs over the past 2 years Continue novoLOG and LANTUS. Start PT to increase activity level and potentially regain weight bearing ability.  Deafness Lip reads to some degree; communicates via sign language with daughter  Dysphagia Dysphagia due to previous stroke. CT of head on 03/20/15 showed stable atrophy and chronic small vessel ischemic change with no acute intracranial  abnormality. Diet: dysphagia 1 diet, nectar thick fluid consistency.  Chronic debility According to her daughter patient is bedridden for the past 1 year. That was stated by daughter to be secondary to sudden weight gain and peripheral neuropathy.  Code Status: full Family Communication: daughter is at bedside and is understanding of plan of care Disposition Plan: Remains as inpatient   Consultants:  None  Procedures:  None  Antibiotics:  Primaxin   Objective: Filed Vitals:   03/25/15 1126  BP: 178/63  Pulse: 81  Temp:   Resp: 22    Intake/Output Summary (Last 24 hours) at 03/25/15 1159 Last data filed at 03/25/15 0946  Gross per 24 hour  Intake   1015 ml  Output      0 ml  Net   1015 ml   Filed Weights   03/22/15 0625 03/24/15 0516 03/25/15 0607  Weight: 93.961 kg (207 lb 2.3 oz) 93.1 kg (205 lb 4 oz) 93.441 kg (206 lb)    Exam:   General:  Appears calm and comfortable.  HEENT: suctioned her mouth and into her oropharynx without presence of a gag reflex.  Cardiovascular: RRR, no m/r/g. Lower extremity edema.  Respiratory: Patient unable to cough and clear upper airways. Upper airway turbulence.  Abdomen: soft, nt; mildly distended   Data Reviewed: Basic Metabolic Panel:  Recent Labs Lab 03/20/15 2301 03/21/15 0403 03/22/15 0325 03/23/15 0411 03/25/15 0818  NA 137 139 140 140 145  K 4.5 4.4 4.2 4.2 3.9  CL 99* 102 105 105 108  CO2 GLUCOSE 201* 249*  221* 239* 93  BUN 18 17 16 13 13   CREATININE 1.34* 1.40* 1.29* 1.13* 1.09*  CALCIUM 9.2 8.5* 8.3* 8.7* 9.1   Liver Function Tests:  Recent Labs Lab 03/20/15 2301  AST 27  ALT 20  ALKPHOS 81  BILITOT 0.7  PROT 7.6  ALBUMIN 3.2*   CBC:  Recent Labs Lab 03/20/15 2301 03/22/15 0325 03/23/15 0411  WBC 7.6 8.0 7.3  NEUTROABS 6.4  --   --   HGB 14.0 10.5* 10.4*  HCT 42.6 32.2* 32.0*  MCV 79.6 78.7 79.0  PLT 215 182 190    CBG:  Recent Labs Lab  03/24/15 1635 03/24/15 1956 03/24/15 2154 03/25/15 0632 03/25/15 1124  GLUCAP 151* 154* 169* 93 158*    Recent Results (from the past 240 hour(s))  Urine culture     Status: None   Collection Time: 03/20/15 10:55 PM  Result Value Ref Range Status   Specimen Description URINE, CLEAN CATCH  Final   Special Requests NONE  Final   Colony Count   Final    >=100,000 COLONIES/ML Performed at [facility]    Culture   Final    ESCHERICHIA COLI Note: Confirmed Extended Spectrum Beta-Lactamase Producer (ESBL) CRITICAL RESULT CALLED TO, READ BACK BY AND VERIFIED WITH: JUANITA FUTRELL @ 7:59 AM 03/24/15 BY DWEEKS Performed at [facility]    Report Status 03/24/2015 FINAL  Final   Organism ID, Bacteria ESCHERICHIA COLI  Final      Susceptibility   Escherichia coli - MIC*    AMPICILLIN >=32 RESISTANT Resistant     CEFAZOLIN >=64 RESISTANT Resistant     CEFTRIAXONE RESISTANT      CIPROFLOXACIN >=4 RESISTANT Resistant     GENTAMICIN <=1 SENSITIVE Sensitive     LEVOFLOXACIN >=8 RESISTANT Resistant     NITROFURANTOIN <=16 SENSITIVE Sensitive     TOBRAMYCIN <=1 SENSITIVE Sensitive     TRIMETH/SULFA <=20 SENSITIVE Sensitive     IMIPENEM <=0.25 SENSITIVE Sensitive     PIP/TAZO <=4 SENSITIVE Sensitive     * ESCHERICHIA COLI  Blood culture (routine x 2)     Status: None (Preliminary result)   Collection Time: 03/20/15 10:57 PM  Result Value Ref Range Status   Specimen Description BLOOD RIGHT ANTECUBITAL  Final   Special Requests BOTTLES DRAWN AEROBIC ONLY 2CC  Final   Culture   Final           BLOOD CULTURE RECEIVED NO GROWTH TO DATE CULTURE WILL BE HELD FOR 5 DAYS BEFORE ISSUING A FINAL NEGATIVE REPORT Performed at [facility]    Report Status PENDING  Incomplete  Blood culture (routine x 2)     Status: None (Preliminary result)   Collection Time: 03/20/15 11:00 PM  Result Value Ref Range Status   Specimen Description BLOOD LEFT HAND  Final   Special Requests  BOTTLES DRAWN AEROBIC ONLY 2CC  Final   Culture   Final           BLOOD CULTURE RECEIVED NO GROWTH TO DATE CULTURE WILL BE HELD FOR 5 DAYS BEFORE ISSUING A FINAL NEGATIVE REPORT Performed at [facility]    Report Status PENDING  Incomplete  MRSA PCR Screening     Status: None   Collection Time: 03/21/15  2:49 AM  Result Value Ref Range Status   MRSA by PCR NEGATIVE NEGATIVE Final    Comment:        The GeneXpert MRSA Assay (FDA approved for NASAL  specimens only), is one component of a comprehensive MRSA colonization surveillance program. It is not intended to diagnose MRSA infection nor to guide or monitor treatment for MRSA infections.      Studies: CXR on 03/20/15 showed suspected right lower lobe infiltrate. CT of head on 03/20/15 showed stable atrophy and chronic small vessel ischemic change; no acute intracranial abnormality. Her blood culture has no growth to date. Her urine culture grew escherichia Coli with confirmed extended spectrum Beta-lactamase producer (ESBL). MRSA by PCR was negative. Her glucose 03/23/15 at 0411 was 239. CT angio chest on 03/23/15 showed no evidence of acute pulmonary thromboembolism.The ascending aorta and aortic arch are small caliber but the descending aorta is normal in caliber. There is no significant stenosis in this may represent pseudo-coarctation which can be associated with aortic valve abnormalities. Consider echocardiogram to further characterize. There are bilateral ill-defined and patchy pulmonary opacities in the lower lobes worrisome for an inflammatory process. There is also ground-glass in the right upper and middle lobes as well as soft tissue prominence in the right hilum. CT follow-up in 3 months is recommended to ensure resolution of these findings.  Scheduled Meds: . antiseptic oral rinse  7 mL Mouth Rinse BID  . atenolol  50 mg Oral Daily  . atorvastatin  80 mg Oral q1800  . azelastine  1 spray Each Nare BID  .  budesonide-formoterol  2 puff Inhalation BID  . clopidogrel  75 mg Oral Daily  . docusate sodium  100 mg Oral Daily  . fluticasone  2 spray Each Nare Daily  . heparin  5,000 Units Subcutaneous 3 times per day  . hydrALAZINE  50 mg Oral BID  . imipenem-cilastatin  500 mg Intravenous 3 times per day  . insulin aspart  0-15 Units Subcutaneous TID WC  . insulin aspart  4 Units Subcutaneous TID WC  . insulin glargine  35 Units Subcutaneous QHS  . ipratropium-albuterol  3 mL Inhalation TID  . latanoprost  1 drop Both Eyes QHS  . montelukast  10 mg Oral QHS  . pantoprazole  40 mg Oral Daily  . pregabalin  200 mg Oral BID  . vancomycin  750 mg Intravenous Q12H   Continuous Infusions:   Principal Problem:   HCAP (healthcare-associated pneumonia) Active Problems:   DM2 (diabetes mellitus, type 2)   Hearing loss   Sepsis   Right lower lobe pneumonia   Dysphagia   Pressure ulcer    Time spent: 45    [facility] Pager [phone]. If 7PM-7AM, please contact night-coverage at [email or web address], password [facility] 03/25/2015, 11:59 AM  LOS: 4 days

## 2015-03-25 NOTE — Progress Notes (Signed)
Noted that patient has not voided so far this shift.  Bladder scan performed showing .  Dr. Arthor Captain text paged, will await further orders.

## 2015-03-26 DIAGNOSIS — H9193 Unspecified hearing loss, bilateral: Secondary | ICD-10-CM

## 2015-03-26 LAB — BASIC METABOLIC PANEL
Anion gap: 11 (ref 5–15)
BUN: 16 mg/dL (ref 6–20)
CO2: 28 mmol/L (ref 22–32)
Calcium: 9.4 mg/dL (ref 8.9–10.3)
Chloride: 105 mmol/L (ref 101–111)
Creatinine, Ser: 1.23 mg/dL — ABNORMAL HIGH (ref 0.44–1.00)
GFR calc Af Amer: 53 mL/min — ABNORMAL LOW (ref >60)
GFR calc non Af Amer: 46 mL/min — ABNORMAL LOW (ref >60)
Glucose, Bld: 280 mg/dL — ABNORMAL HIGH (ref 65–99)
POTASSIUM: 3.9 mmol/L (ref 3.5–5.1)
SODIUM: 144 mmol/L (ref 135–145)

## 2015-03-26 LAB — GLUCOSE, CAPILLARY
GLUCOSE-CAPILLARY: 195 mg/dL — AB (ref 65–99)
Glucose-Capillary: 169 mg/dL — ABNORMAL HIGH (ref 65–99)

## 2015-03-26 MED ORDER — FOSFOMYCIN TROMETHAMINE 3 G PO PACK
3.0000 g | PACK | Freq: Once | ORAL | Status: DC
  Filled 2015-03-26: qty 3

## 2015-03-26 MED ORDER — AMOXICILLIN-POT CLAVULANATE 600-42.9 MG/5ML PO SUSR: 7.5000 mL | Freq: Two times a day (BID) | ORAL | Status: DC

## 2015-03-26 NOTE — Discharge Summary (Signed)
Physician Discharge Summary  Valerie Rodriguez:[id number] DOB: [birth year] DOA: 03/20/2015  PCP: No PCP Per Patient  Admit date: 03/20/2015 Discharge date: 03/26/2015  Time spent: 40 minutes  Recommendations for Outpatient Follow-up:  1. Follow up with nursing home M.D. 2. Augmentin for 5 more days. 3. Patient has very poor cough effort and high risk for aspiration  Discharge Diagnoses:  Principal Problem:   HCAP (healthcare-associated pneumonia) Active Problems:   DM2 (diabetes mellitus, type 2)   Hearing loss   Sepsis   Right lower lobe pneumonia   Dysphagia   Pressure ulcer   Discharge Condition: Stable  Diet recommendation: Dysphagia 1 with nectar thick liquids  Filed Weights   03/24/15 0516 03/25/15 0607 03/26/15 0536  Weight: 93.1 kg (205 lb 4 oz) 93.441 kg (206 lb) 98.2 kg (216 lb 7.9 oz)    History of present illness:  Valerie Rodriguez is a 64 y.o. 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 Patient presents to ED with AMS. At baseline patient signs. Today patient was altered, less responsive, brought in by EMS with low O2 sat on room air. After arrival she is satting well on 2L of O2. Fever was noted to be 104.2. Work up demonstrates RLL PNA on CXR. Patients mental status improved and she is now signing to family member who is in room with her.   Hospital Course:   Sepsis secondary to HCAP versus aspiration pneumonia - Patient was febrile and tachycardic upon admission.  - CXR showed suspected RLL infiltrates. Lactate level of 2.7.  - Two episodes of hemoptysis during FEES, followed by blowing blood out of her nose.  - Blood culture showed no growth. - MRSA by PCR Negative. - CTA chest showed no evidence of acute pulmonary thromboembolism. - Discharged on 5 more days of Augmentin.  Escherichia coli ESBL UTI -Urine culture grew ESBL producing Escherichia coli -Switch from vanc/zosyn to primaxin due to culture showing  e. Coli with ESBL. -Had 3 days of Zosyn, 2 days of Primaxin and 1 dose of fosfomycin prior to discharge.  Hemoptysis She had 2 episodes of hemoptysis while she is having the FEES. True hemoptysis as blood was coming from between the true vocal cords CTA showed no evidence of PE but bilateral multiple infiltrates. Hemoptysis resolved.  Acute renal insufficiency Creatinine decreased from 1.40 on admission to 1.13 on 03/23/15. Creatinine going up and down, on discharge is 1.23  DM2 Weight gain of 76 lbs over the past 2 years Continue novoLOG and LANTUS. Start PT to increase activity level and potentially regain weight bearing ability.  Deafness Lip reads to some degree; communicates via sign language with daughter  Dysphagia Dysphagia due to previous stroke. CT of head on 03/20/15 showed stable atrophy and chronic small vessel ischemic change with no acute intracranial abnormality. Diet: dysphagia 1 diet, nectar thick fluid consistency.  Chronic debility According to her daughter patient is bedridden for the past 1 year. That was stated by daughter to be secondary to sudden weight gain and peripheral neuropathy.   Procedures:  None  Consultations:  None  Discharge Exam: Filed Vitals:   03/26/15 0536  BP: 174/78  Pulse: 78  Temp: 98.2 F (36.8 C)  Resp: 22   General: Alert and awake, oriented x3, not in any acute distress. HEENT: anicteric sclera, pupils reactive to light and accommodation, EOMI CVS: S1-S2 clear, no murmur rubs or gallops Chest: clear to auscultation bilaterally, no wheezing, rales or rhonchi  Abdomen: soft nontender, nondistended, normal bowel sounds, no organomegaly Extremities: no cyanosis, clubbing or edema noted bilaterally Neuro: Cranial nerves II-XII intact, no focal neurological deficits  Discharge Instructions   Discharge Instructions    Diet - low sodium heart healthy    Complete by:  As directed      Increase activity slowly    Complete  by:  As directed           Current Discharge Medication List    START taking these medications   Details  amoxicillin-clavulanate (AUGMENTIN) 600-42.9 MG/5ML suspension Take 7.5 mLs by mouth 2 (two) times daily. Qty: 200 mL, Refills: 0      CONTINUE these medications which have NOT CHANGED   Details  albuterol (PROVENTIL HFA;VENTOLIN HFA) 108 (90 BASE) MCG/ACT inhaler Inhale 2 puffs into the lungs every 6 (six) hours as needed for wheezing or shortness of breath.    albuterol-ipratropium (COMBIVENT) 18-103 MCG/ACT inhaler Inhale 2 puffs into the lungs 4 (four) times daily. Qty: 3 Inhaler, Refills: 3    atenolol (TENORMIN) 50 MG tablet Take 1 tablet (50 mg total) by mouth daily. Qty: 90 tablet, Refills: 3    atorvastatin (LIPITOR) 80 MG tablet Take 1 tablet (80 mg total) by mouth daily. Qty: 90 tablet, Refills: 3    azelastine (ASTELIN) 137 MCG/SPRAY nasal spray Place 1 spray into both nostrils 2 (two) times daily. Use in each nostril as directed Qty: 90 mL, Refills: 3    citalopram (CELEXA) 20 MG tablet Take 0.5 tablets (10 mg total) by mouth daily. Qty: 90 tablet, Refills: 3    clopidogrel (PLAVIX) 75 MG tablet Take 1 tablet (75 mg total) by mouth daily. Qty: 90 tablet, Refills: 3    docusate sodium (COLACE) 100 MG capsule Take 100 mg by mouth every morning.    enalapril (VASOTEC) 2.5 MG tablet Take 2.5 mg by mouth every morning.     ferrous sulfate 325 (65 FE) MG tablet Take 325 mg by mouth every morning.    fluticasone (FLONASE) 50 MCG/ACT nasal spray Place 2 sprays into both nostrils daily. Qty: 48 g, Refills: 3    Fluticasone Furoate-Vilanterol 100-25 MCG/INH AEPB Inhale 1 puff into the lungs every morning.    furosemide (LASIX) 20 MG tablet Take 1 tablet (20 mg total) by mouth daily. Qty: 30 tablet, Refills: 11    Glucose Blood (BAYER BREEZE 2 TEST) DISK 2 (two) times daily.      glucose blood test strip 1 each by Other route 4 (four) times daily. Use as  instructed    hydrALAZINE (APRESOLINE) 50 MG tablet Take 1 tablet (50 mg total) by mouth 2 (two) times daily. Qty: 180 tablet, Refills: 3    HYDROcodone-acetaminophen (NORCO/VICODIN) 5-325 MG per tablet Take 1 tablet by mouth 2 (two) times daily.    insulin glargine (LANTUS) 100 UNIT/ML injection Inject 0.2 mLs (20 Units total) into the skin at bedtime. Qty: 10 mL, Refills: 10    insulin lispro (HUMALOG) 100 UNIT/ML KiwkPen Inject 10 Units into the skin 3 (three) times daily before meals.    ipratropium-albuterol (DUONEB) 0.5-2.5 (3) MG/3ML SOLN Take 3 mLs by nebulization every 4 (four) hours as needed (wheezing or shortness of breath).    latanoprost (XALATAN) 0.005 % ophthalmic solution Place 1 drop into both eyes at bedtime.      montelukast (SINGULAIR) 10 MG tablet Take 1 tablet (10 mg total) by mouth at bedtime. Qty: 90 tablet, Refills: 3    pantoprazole (PROTONIX)  40 MG tablet Take 1 tablet (40 mg total) by mouth daily. Qty: 90 tablet, Refills: 3    polyethylene glycol (MIRALAX / GLYCOLAX) packet Take 17 g by mouth every morning.    pregabalin (LYRICA) 200 MG capsule Take 1 capsule (200 mg total) by mouth 2 (two) times daily. Qty: 180 capsule, Refills: 3    Vitamin D, Ergocalciferol, (DRISDOL) 50000 UNITS CAPS capsule Take 50,000 Units by mouth every 30 (thirty) days.    budesonide-formoterol (SYMBICORT) 160-4.5 MCG/ACT inhaler Inhale 2 puffs into the lungs 2 (two) times daily. Qty: 3 Inhaler, Refills: 3      STOP taking these medications     promethazine (PHENERGAN) 25 MG tablet        No Known Allergies    The results of significant diagnostics from this hospitalization (including imaging, microbiology, ancillary and laboratory) are listed below for reference.    Significant Diagnostic Studies: Ct Head Wo Contrast  03/21/2015   CLINICAL DATA:  Altered mental status.  EXAM: CT HEAD WITHOUT CONTRAST  TECHNIQUE: Contiguous axial images were obtained from the base of  the skull through the vertex without intravenous contrast.  COMPARISON:  Head CT 01/09/2013  FINDINGS: Generalized cerebral and cerebellar volume loss. Stable periventricular white matter change. Remote lacunar infarcts in the left caudate and thalamus. No intracranial hemorrhage, mass effect, or midline shift. No hydrocephalus. The basilar cisterns are patent. No evidence of acute ischemia. No intracranial fluid collection. Calvarium is intact. Included paranasal sinuses and mastoid air cells are well aerated.  IMPRESSION: Stable atrophy and chronic small vessel ischemic change. No acute intracranial abnormality.   Electronically Signed   By: Rubye Oaks M.D.   On: 03/21/2015 00:38   Ct Angio Chest Pe W/cm &/or Wo Cm  03/23/2015   CLINICAL DATA:  Hemoptysis  EXAM: CT ANGIOGRAPHY CHEST WITH CONTRAST  TECHNIQUE: Multidetector CT imaging of the chest was performed using the standard protocol during bolus administration of intravenous contrast. Multiplanar CT image reconstructions and MIPs were obtained to evaluate the vascular anatomy.  CONTRAST:  OMNIPAQUE IOHEXOL 350 MG/ML SOLN  COMPARISON:  01/14/2013 two view chest radiograph study  FINDINGS: There are no filling defects in the pulmonary arterial tree to suggest acute pulmonary thromboembolism.  The ascending aorta and aortic arch is small caliber. The descending aorta assumes a more normal caliber with a maximal diameter of 3.4 cm. At the transition point, there is no significant focal narrowing. This may represent a pseudo-coarctation.  No abnormal mediastinal adenopathy. Coronary artery calcifications are noted  There is peribronchovascular soft tissue thickening within the right hilum. There is an ill-defined 3.1 cm right medial basal pulmonary opacity. Similar multiple patchy opacities at the dependent left lung base are also present. Patchy density in the superior segment of the right lower lobe on image 35. There are ground-glass opacities  scattered within the right upper and middle lobes. The left upper lobe is relatively spared.  No pneumothorax.  No pleural effusion.  T12 compression deformity is stable compared with the prior lateral radiograph from 2014.  Review of the MIP images confirms the above findings.  IMPRESSION: No evidence of acute pulmonary thromboembolism  The ascending aorta and aortic arch are small caliber but the descending aorta is normal in caliber. There is no significant stenosis in this may represent pseudo-coarctation which can be associated with aortic valve abnormalities. Consider echocardiogram to further characterize.  There are bilateral ill-defined and patchy pulmonary opacities in the lower lobes worrisome for an  inflammatory process. There is also ground-glass in the right upper and middle lobes as well as soft tissue prominence in the right hilum. CT follow-up in 3 months is recommended to ensure resolution of these findings.   Electronically Signed   By: Jolaine Click M.D.   On: 03/23/2015 19:56   Dg Chest Port 1 View  03/20/2015   CLINICAL DATA:  Low oxygen saturation.  EXAM: PORTABLE CHEST - 1 VIEW  COMPARISON:  Chest radiograph 01/14/2013.  FINDINGS: Low lung volumes. Suspected cardiomegaly. Vascular congestion without overt failure. RIGHT lower lung zone opacity could represent pneumonia. Difficult to evaluate for pleural effusion. Vascular calcification. Osteopenia.  IMPRESSION: Suspected RIGHT lower lobe infiltrate.   Electronically Signed   By: Davonna Belling M.D.   On: 03/20/2015 23:43    Microbiology: Recent Results (from the past 240 hour(s))  Urine culture     Status: None   Collection Time: 03/20/15 10:55 PM  Result Value Ref Range Status   Specimen Description URINE, CLEAN CATCH  Final   Special Requests NONE  Final   Colony Count   Final    >=100,000 COLONIES/ML Performed at [facility]    Culture   Final    ESCHERICHIA COLI Note: Confirmed Extended Spectrum Beta-Lactamase  Producer (ESBL) CRITICAL RESULT CALLED TO, READ BACK BY AND VERIFIED WITH: JUANITA FUTRELL @ 7:59 AM 03/24/15 BY DWEEKS Performed at [facility]    Report Status 03/24/2015 FINAL  Final   Organism ID, Bacteria ESCHERICHIA COLI  Final      Susceptibility   Escherichia coli - MIC*    AMPICILLIN >=32 RESISTANT Resistant     CEFAZOLIN >=64 RESISTANT Resistant     CEFTRIAXONE RESISTANT      CIPROFLOXACIN >=4 RESISTANT Resistant     GENTAMICIN <=1 SENSITIVE Sensitive     LEVOFLOXACIN >=8 RESISTANT Resistant     NITROFURANTOIN <=16 SENSITIVE Sensitive     TOBRAMYCIN <=1 SENSITIVE Sensitive     TRIMETH/SULFA <=20 SENSITIVE Sensitive     IMIPENEM <=0.25 SENSITIVE Sensitive     PIP/TAZO <=4 SENSITIVE Sensitive     * ESCHERICHIA COLI  Blood culture (routine x 2)     Status: None (Preliminary result)   Collection Time: 03/20/15 10:57 PM  Result Value Ref Range Status   Specimen Description BLOOD RIGHT ANTECUBITAL  Final   Special Requests BOTTLES DRAWN AEROBIC ONLY 2CC  Final   Culture   Final           BLOOD CULTURE RECEIVED NO GROWTH TO DATE CULTURE WILL BE HELD FOR 5 DAYS BEFORE ISSUING A FINAL NEGATIVE REPORT Performed at [facility]    Report Status PENDING  Incomplete  Blood culture (routine x 2)     Status: None (Preliminary result)   Collection Time: 03/20/15 11:00 PM  Result Value Ref Range Status   Specimen Description BLOOD LEFT HAND  Final   Special Requests BOTTLES DRAWN AEROBIC ONLY 2CC  Final   Culture   Final           BLOOD CULTURE RECEIVED NO GROWTH TO DATE CULTURE WILL BE HELD FOR 5 DAYS BEFORE ISSUING A FINAL NEGATIVE REPORT Performed at [facility]    Report Status PENDING  Incomplete  MRSA PCR Screening     Status: None   Collection Time: 03/21/15  2:49 AM  Result Value Ref Range Status   MRSA by PCR NEGATIVE NEGATIVE Final    Comment:  The GeneXpert MRSA Assay (FDA approved for NASAL specimens only), is one component of  a comprehensive MRSA colonization surveillance program. It is not intended to diagnose MRSA infection nor to guide or monitor treatment for MRSA infections.      Labs: Basic Metabolic Panel:  Recent Labs Lab 03/21/15 0403 03/22/15 0325 03/23/15 0411 03/25/15 0818 03/26/15 0308  NA 139 140 140 145 144  K 4.4 4.2 4.2 3.9 3.9  CL 102 105 105 108 105  CO2 27 24 27 29 28   GLUCOSE 249* 221* 239* 93 280*  BUN 17 16 13 13 16   CREATININE 1.40* 1.29* 1.13* 1.09* 1.23*  CALCIUM 8.5* 8.3* 8.7* 9.1 9.4   Liver Function Tests:  Recent Labs Lab 03/20/15 2301  AST 27  ALT 20  ALKPHOS 81  BILITOT 0.7  PROT 7.6  ALBUMIN 3.2*   No results for input(s): LIPASE, AMYLASE in the last 168 hours. No results for input(s): AMMONIA in the last 168 hours. CBC:  Recent Labs Lab 03/20/15 2301 03/22/15 0325 03/23/15 0411  WBC 7.6 8.0 7.3  NEUTROABS 6.4  --   --   HGB 14.0 10.5* 10.4*  HCT 42.6 32.2* 32.0*  MCV 79.6 78.7 79.0  PLT 215 182 190   Cardiac Enzymes: No results for input(s): CKTOTAL, CKMB, CKMBINDEX, TROPONINI in the last 168 hours. BNP: BNP (last 3 results) No results for input(s): BNP in the last 8760 hours.  ProBNP (last 3 results) No results for input(s): PROBNP in the last 8760 hours.  CBG:  Recent Labs Lab 03/25/15 1124 03/25/15 1429 03/25/15 1619 03/25/15 2228 03/26/15 0615  GLUCAP 158* 197* 184* 202* 195*       Signed:  Dwayne Bulkley A  [facility] 03/26/2015, 11:18 AM

## 2015-03-26 NOTE — Progress Notes (Addendum)
Patient is a planned DC today/Saturday to return back to SNF. LCSW was alerted by nursing staff. Completed packet and FL2 has been signed. Awaiting MD to completed DC Summary. LCSW used interpreter to communicate with patient that she is returning to SNF today. Patient nods yes in agreement. Reports her daughters are both at work, but they can be called.  RN aware of DC and called MD for DC summary.  [facility] called and confirm patient can return to SNF today and send DC summary.  Will notify patient daughter once all is confirmed and arrange transport by EMS. Daughter [name] called to notify patient is discharging. EMS arranged by SW  Deretha Emory, MSW Clinical Social Work: Emergency Room [phone]

## 2015-03-26 NOTE — Progress Notes (Signed)
Report called to [facility] nurse [place] . Patient to be transported via [facility] . VSS no distress noted.

## 2015-03-27 LAB — CULTURE, BLOOD (ROUTINE X 2)
CULTURE: NO GROWTH
Culture: NO GROWTH

## 2015-03-28 ENCOUNTER — Ambulatory Visit: Payer: Medicare Other | Admitting: Podiatry

## 2015-04-05 ENCOUNTER — Ambulatory Visit: Payer: Medicare Other | Admitting: Podiatry

## 2015-04-08 ENCOUNTER — Inpatient Hospital Stay (HOSPITAL_COMMUNITY)
Admission: EM | Admit: 2015-04-08 | Discharge: 2015-04-12 | DRG: 871 | Disposition: A | Discharge status: Skilled Nursing Facility | Payer: Medicare Other | Attending: Internal Medicine | Admitting: Internal Medicine

## 2015-04-08 ENCOUNTER — Emergency Department (HOSPITAL_COMMUNITY): Payer: Medicare Other

## 2015-04-08 DIAGNOSIS — I5033 Acute on chronic diastolic (congestive) heart failure: Secondary | ICD-10-CM | POA: Diagnosis present

## 2015-04-08 DIAGNOSIS — E870 Hyperosmolality and hypernatremia: Secondary | ICD-10-CM | POA: Diagnosis present

## 2015-04-08 DIAGNOSIS — Z87891 Personal history of nicotine dependence: Secondary | ICD-10-CM

## 2015-04-08 DIAGNOSIS — Y95 Nosocomial condition: Secondary | ICD-10-CM | POA: Diagnosis present

## 2015-04-08 DIAGNOSIS — H9190 Unspecified hearing loss, unspecified ear: Secondary | ICD-10-CM | POA: Diagnosis present

## 2015-04-08 DIAGNOSIS — E785 Hyperlipidemia, unspecified: Secondary | ICD-10-CM | POA: Diagnosis present

## 2015-04-08 DIAGNOSIS — J9601 Acute respiratory failure with hypoxia: Secondary | ICD-10-CM | POA: Diagnosis present

## 2015-04-08 DIAGNOSIS — N179 Acute kidney failure, unspecified: Secondary | ICD-10-CM | POA: Diagnosis present

## 2015-04-08 DIAGNOSIS — E872 Acidosis: Secondary | ICD-10-CM | POA: Diagnosis present

## 2015-04-08 DIAGNOSIS — J9602 Acute respiratory failure with hypercapnia: Secondary | ICD-10-CM | POA: Diagnosis present

## 2015-04-08 DIAGNOSIS — R600 Localized edema: Secondary | ICD-10-CM

## 2015-04-08 DIAGNOSIS — G9340 Encephalopathy, unspecified: Secondary | ICD-10-CM | POA: Diagnosis present

## 2015-04-08 DIAGNOSIS — Z6841 Body Mass Index (BMI) 40.0 and over, adult: Secondary | ICD-10-CM | POA: Diagnosis not present

## 2015-04-08 DIAGNOSIS — I739 Peripheral vascular disease, unspecified: Secondary | ICD-10-CM | POA: Diagnosis present

## 2015-04-08 DIAGNOSIS — J45909 Unspecified asthma, uncomplicated: Secondary | ICD-10-CM | POA: Diagnosis present

## 2015-04-08 DIAGNOSIS — I69391 Dysphagia following cerebral infarction: Secondary | ICD-10-CM

## 2015-04-08 DIAGNOSIS — E119 Type 2 diabetes mellitus without complications: Secondary | ICD-10-CM

## 2015-04-08 DIAGNOSIS — N182 Chronic kidney disease, stage 2 (mild): Secondary | ICD-10-CM | POA: Diagnosis present

## 2015-04-08 DIAGNOSIS — R0600 Dyspnea, unspecified: Secondary | ICD-10-CM | POA: Diagnosis not present

## 2015-04-08 DIAGNOSIS — Z794 Long term (current) use of insulin: Secondary | ICD-10-CM

## 2015-04-08 DIAGNOSIS — E875 Hyperkalemia: Secondary | ICD-10-CM | POA: Diagnosis present

## 2015-04-08 DIAGNOSIS — I69354 Hemiplegia and hemiparesis following cerebral infarction affecting left non-dominant side: Secondary | ICD-10-CM | POA: Diagnosis not present

## 2015-04-08 DIAGNOSIS — Z7902 Long term (current) use of antithrombotics/antiplatelets: Secondary | ICD-10-CM

## 2015-04-08 DIAGNOSIS — R4701 Aphasia: Secondary | ICD-10-CM | POA: Diagnosis present

## 2015-04-08 DIAGNOSIS — A419 Sepsis, unspecified organism: Secondary | ICD-10-CM | POA: Diagnosis present

## 2015-04-08 DIAGNOSIS — Z7401 Bed confinement status: Secondary | ICD-10-CM

## 2015-04-08 DIAGNOSIS — J9811 Atelectasis: Secondary | ICD-10-CM | POA: Diagnosis present

## 2015-04-08 DIAGNOSIS — I6930 Unspecified sequelae of cerebral infarction: Secondary | ICD-10-CM

## 2015-04-08 DIAGNOSIS — I129 Hypertensive chronic kidney disease with stage 1 through stage 4 chronic kidney disease, or unspecified chronic kidney disease: Secondary | ICD-10-CM | POA: Diagnosis present

## 2015-04-08 DIAGNOSIS — H409 Unspecified glaucoma: Secondary | ICD-10-CM | POA: Diagnosis present

## 2015-04-08 DIAGNOSIS — J189 Pneumonia, unspecified organism: Secondary | ICD-10-CM | POA: Diagnosis present

## 2015-04-08 DIAGNOSIS — K219 Gastro-esophageal reflux disease without esophagitis: Secondary | ICD-10-CM | POA: Diagnosis present

## 2015-04-08 DIAGNOSIS — E118 Type 2 diabetes mellitus with unspecified complications: Secondary | ICD-10-CM | POA: Diagnosis not present

## 2015-04-08 DIAGNOSIS — R1310 Dysphagia, unspecified: Secondary | ICD-10-CM | POA: Diagnosis present

## 2015-04-08 DIAGNOSIS — I10 Essential (primary) hypertension: Secondary | ICD-10-CM | POA: Diagnosis present

## 2015-04-08 DIAGNOSIS — J9600 Acute respiratory failure, unspecified whether with hypoxia or hypercapnia: Secondary | ICD-10-CM | POA: Diagnosis present

## 2015-04-08 DIAGNOSIS — R5383 Other fatigue: Secondary | ICD-10-CM | POA: Diagnosis present

## 2015-04-08 DIAGNOSIS — R0902 Hypoxemia: Secondary | ICD-10-CM

## 2015-04-08 LAB — CBC WITH DIFFERENTIAL/PLATELET
Basophils Absolute: 0.1 10*3/uL (ref 0.0–0.1)
Basophils Absolute: 0.1 10*3/uL (ref 0.0–0.1)
Basophils Relative: 1 % (ref 0–1)
Basophils Relative: 1 % (ref 0–1)
EOS ABS: 0.1 10*3/uL (ref 0.0–0.7)
EOS PCT: 1 % (ref 0–5)
Eosinophils Absolute: 0.1 10*3/uL (ref 0.0–0.7)
Eosinophils Relative: 1 % (ref 0–5)
HCT: 37.6 % (ref 36.0–46.0)
HCT: 35.2 % — ABNORMAL LOW (ref 36.0–46.0)
HEMOGLOBIN: 12.1 g/dL (ref 12.0–15.0)
HEMOGLOBIN: 11.3 g/dL — AB (ref 12.0–15.0)
LYMPHS ABS: 2.1 10*3/uL (ref 0.7–4.0)
LYMPHS ABS: 1.6 10*3/uL (ref 0.7–4.0)
Lymphocytes Relative: 27 % (ref 12–46)
Lymphocytes Relative: 21 % (ref 12–46)
MCH: 25.9 pg — AB (ref 26.0–34.0)
MCH: 26 pg (ref 26.0–34.0)
MCHC: 32.2 g/dL (ref 30.0–36.0)
MCHC: 32.1 g/dL (ref 30.0–36.0)
MCV: 80.5 fL (ref 78.0–100.0)
MCV: 80.9 fL (ref 78.0–100.0)
MONOS PCT: 13 % — AB (ref 3–12)
MONOS PCT: 8 % (ref 3–12)
Monocytes Absolute: 1.1 10*3/uL — ABNORMAL HIGH (ref 0.1–1.0)
Monocytes Absolute: 0.6 10*3/uL (ref 0.1–1.0)
NEUTROS PCT: 69 % (ref 43–77)
Neutro Abs: 4.7 10*3/uL (ref 1.7–7.7)
Neutro Abs: 5.4 10*3/uL (ref 1.7–7.7)
Neutrophils Relative %: 58 % (ref 43–77)
Platelets: 266 10*3/uL (ref 150–400)
Platelets: 240 10*3/uL (ref 150–400)
RBC: 4.67 MIL/uL (ref 3.87–5.11)
RBC: 4.35 MIL/uL (ref 3.87–5.11)
RDW: 16.9 % — ABNORMAL HIGH (ref 11.5–15.5)
RDW: 16.7 % — ABNORMAL HIGH (ref 11.5–15.5)
WBC: 8 10*3/uL (ref 4.0–10.5)
WBC: 7.8 10*3/uL (ref 4.0–10.5)

## 2015-04-08 LAB — URINALYSIS, ROUTINE W REFLEX MICROSCOPIC
BILIRUBIN URINE: NEGATIVE
Glucose, UA: NEGATIVE mg/dL
Hgb urine dipstick: NEGATIVE
KETONES UR: NEGATIVE mg/dL
Nitrite: NEGATIVE
PROTEIN: NEGATIVE mg/dL
Specific Gravity, Urine: 1.015 (ref 1.005–1.030)
Urobilinogen, UA: 0.2 mg/dL (ref 0.0–1.0)
pH: 6 (ref 5.0–8.0)

## 2015-04-08 LAB — I-STAT TROPONIN, ED: Troponin i, poc: 0.03 ng/mL (ref 0.00–0.08)

## 2015-04-08 LAB — BLOOD GAS, ARTERIAL
Acid-Base Excess: 7.8 mmol/L — ABNORMAL HIGH (ref 0.0–2.0)
Bicarbonate: 32.8 mEq/L — ABNORMAL HIGH (ref 20.0–24.0)
DELIVERY SYSTEMS: POSITIVE
DRAWN BY: 418751
Expiratory PAP: 8
FIO2: 100 %
Inspiratory PAP: 18
LHR: 18 {breaths}/min
MODE: POSITIVE
O2 Saturation: 99.5 %
PO2 ART: 416 mmHg — AB (ref 80.0–100.0)
Patient temperature: 98.6
TCO2: 34.4 mmol/L (ref 0–100)
pCO2 arterial: 54.7 mmHg — ABNORMAL HIGH (ref 35.0–45.0)
pH, Arterial: 7.395 (ref 7.350–7.450)

## 2015-04-08 LAB — I-STAT ARTERIAL BLOOD GAS, ED
ACID-BASE EXCESS: 11 mmol/L — AB (ref 0.0–2.0)
BICARBONATE: 36.9 meq/L — AB (ref 20.0–24.0)
O2 Saturation: 89 %
PH ART: 7.416 (ref 7.350–7.450)
TCO2: 39 mmol/L (ref 0–100)
pCO2 arterial: 57.4 mmHg (ref 35.0–45.0)
pO2, Arterial: 58 mmHg — ABNORMAL LOW (ref 80.0–100.0)

## 2015-04-08 LAB — COMPREHENSIVE METABOLIC PANEL
ALBUMIN: 3.2 g/dL — AB (ref 3.5–5.0)
ALK PHOS: 62 U/L (ref 38–126)
ALT: 15 U/L (ref 14–54)
ANION GAP: 9 (ref 5–15)
AST: 33 U/L (ref 15–41)
BUN: 40 mg/dL — AB (ref 6–20)
CO2: 32 mmol/L (ref 22–32)
CREATININE: 2.05 mg/dL — AB (ref 0.44–1.00)
Calcium: 9.8 mg/dL (ref 8.9–10.3)
Chloride: 109 mmol/L (ref 101–111)
GFR calc Af Amer: 29 mL/min — ABNORMAL LOW (ref >60)
GFR calc non Af Amer: 25 mL/min — ABNORMAL LOW (ref >60)
Glucose, Bld: 194 mg/dL — ABNORMAL HIGH (ref 65–99)
POTASSIUM: 4.9 mmol/L (ref 3.5–5.1)
Sodium: 150 mmol/L — ABNORMAL HIGH (ref 135–145)
TOTAL PROTEIN: 7.4 g/dL (ref 6.5–8.1)
Total Bilirubin: 0.5 mg/dL (ref 0.3–1.2)

## 2015-04-08 LAB — URINE MICROSCOPIC-ADD ON

## 2015-04-08 LAB — CBG MONITORING, ED: GLUCOSE-CAPILLARY: 183 mg/dL — AB (ref 65–99)

## 2015-04-08 LAB — PROTIME-INR
INR: 1.1 (ref 0.00–1.49)
Prothrombin Time: 14.4 seconds (ref 11.6–15.2)

## 2015-04-08 LAB — BRAIN NATRIURETIC PEPTIDE: B NATRIURETIC PEPTIDE 5: 27.2 pg/mL (ref 0.0–100.0)

## 2015-04-08 LAB — APTT: APTT: 23 s — AB (ref 24–37)

## 2015-04-08 MED ORDER — FLUTICASONE PROPIONATE 50 MCG/ACT NA SUSP
2.0000 | Freq: Every day | NASAL | Status: DC
  Administered 2015-04-09 – 2015-04-12 (×4): 2 via NASAL
  Filled 2015-04-08: qty 16

## 2015-04-08 MED ORDER — BUDESONIDE-FORMOTEROL FUMARATE 160-4.5 MCG/ACT IN AERO
2.0000 | INHALATION_SPRAY | Freq: Two times a day (BID) | RESPIRATORY_TRACT | Status: DC
  Administered 2015-04-09 – 2015-04-12 (×7): 2 via RESPIRATORY_TRACT
  Filled 2015-04-08: qty 6

## 2015-04-08 MED ORDER — PANTOPRAZOLE SODIUM 40 MG PO TBEC
40.0000 mg | DELAYED_RELEASE_TABLET | Freq: Every day | ORAL | Status: DC
  Administered 2015-04-09 – 2015-04-12 (×4): 40 mg via ORAL
  Filled 2015-04-08 (×4): qty 1

## 2015-04-08 MED ORDER — IPRATROPIUM-ALBUTEROL 0.5-2.5 (3) MG/3ML IN SOLN: 3.0000 mL | RESPIRATORY_TRACT | Status: DC

## 2015-04-08 MED ORDER — ATORVASTATIN CALCIUM 80 MG PO TABS
80.0000 mg | ORAL_TABLET | Freq: Every evening | ORAL | Status: DC
  Administered 2015-04-09 – 2015-04-11 (×3): 80 mg via ORAL
  Filled 2015-04-08 (×4): qty 1

## 2015-04-08 MED ORDER — PREGABALIN 100 MG PO CAPS
200.0000 mg | ORAL_CAPSULE | Freq: Two times a day (BID) | ORAL | Status: DC
  Administered 2015-04-09 – 2015-04-12 (×7): 200 mg via ORAL
  Filled 2015-04-08 (×8): qty 2

## 2015-04-08 MED ORDER — FLUTICASONE FUROATE-VILANTEROL 100-25 MCG/INH IN AEPB: 1.0000 | INHALATION_SPRAY | RESPIRATORY_TRACT | Status: DC

## 2015-04-08 MED ORDER — IPRATROPIUM-ALBUTEROL 0.5-2.5 (3) MG/3ML IN SOLN
3.0000 mL | RESPIRATORY_TRACT | Status: DC
  Administered 2015-04-09 – 2015-04-10 (×7): 3 mL via RESPIRATORY_TRACT
  Filled 2015-04-08 (×8): qty 3

## 2015-04-08 MED ORDER — CLOPIDOGREL BISULFATE 75 MG PO TABS
75.0000 mg | ORAL_TABLET | Freq: Every day | ORAL | Status: DC
  Administered 2015-04-09 – 2015-04-12 (×4): 75 mg via ORAL
  Filled 2015-04-08 (×4): qty 1

## 2015-04-08 MED ORDER — CITALOPRAM HYDROBROMIDE 10 MG PO TABS
15.0000 mg | ORAL_TABLET | Freq: Every day | ORAL | Status: DC
  Administered 2015-04-09 – 2015-04-12 (×4): 15 mg via ORAL
  Filled 2015-04-08 (×4): qty 2

## 2015-04-08 MED ORDER — IPRATROPIUM-ALBUTEROL 0.5-2.5 (3) MG/3ML IN SOLN
3.0000 mL | Freq: Once | RESPIRATORY_TRACT | Status: AC
  Administered 2015-04-08: 3 mL via RESPIRATORY_TRACT
  Filled 2015-04-08: qty 3

## 2015-04-08 MED ORDER — HEPARIN SODIUM (PORCINE) 5000 UNIT/ML IJ SOLN
5000.0000 [IU] | Freq: Three times a day (TID) | INTRAMUSCULAR | Status: DC
  Administered 2015-04-09 – 2015-04-12 (×10): 5000 [IU] via SUBCUTANEOUS
  Filled 2015-04-08 (×14): qty 1

## 2015-04-08 MED ORDER — INSULIN ASPART 100 UNIT/ML ~~LOC~~ SOLN
0.0000 [IU] | Freq: Four times a day (QID) | SUBCUTANEOUS | Status: DC
  Administered 2015-04-09: 15 [IU] via SUBCUTANEOUS

## 2015-04-08 NOTE — ED Notes (Signed)
MD at bedside pt reports SOB.

## 2015-04-08 NOTE — ED Notes (Signed)
Per EMS- pt sent here for eval of chest pain with recent diagnosis of pneumonia. Pt is deaf and mute.

## 2015-04-08 NOTE — ED Provider Notes (Signed)
CSN: [id number]     Arrival date & time 04/08/15  1452 History   First MD Initiated Contact with Patient 04/08/15 1459     Chief Complaint  Patient presents with  . Fatigue     (Consider location/radiation/quality/duration/timing/severity/associated sxs/prior Treatment) Patient is a 64 y.o. female presenting with general illness.  Illness Quality:  Fatigue Severity:  Moderate Duration:  2 days Timing:  Constant Progression:  Worsening Chronicity:  New Context:  Patient with a history of hypertension, diabetes, chronic kidney disease, prior stroke with left-sided residual weakness, asthma, who was recently admitted for healthcare associated pneumonia. Relieved by:  Nothing tried Worsened by:  Unknown Ineffective treatments:  Unknown Associated symptoms: fatigue and shortness of breath   Associated symptoms: no abdominal pain, no chest pain, no congestion, no cough, no fever, no headaches, no loss of consciousness, no nausea, no rhinorrhea and no vomiting     Past Medical History  Diagnosis Date  . Hypertension   . Hyperlipemia   . Deaf-mutism   . Glaucoma     Dr Eulah Pont  . Diabetes mellitus     Type II, sees Dr. Candie Chroman  . Allergy   . Asthma     sees Dr Stratford Callas  . Stroke 11-05-10    sees Dr. Pearlean Brownie  . Chronic kidney disease     sees Dr. Camille Bal   . PVD (peripheral vascular disease)     sees Dr. Hart Rochester   . PAD (peripheral artery disease)    Past Surgical History  Procedure Laterality Date  . Cholecystectomy    . Abdominal hysterectomy      oophorectomy 1976  . Rotator cuff repair  2004  . Colonoscopy  03-30-09    per Dr. Marina Goodell, poor prep, repeat one yr    Family History  Problem Relation Age of Onset  . Hypertension      family hx  . Coronary artery disease      female 1st degree relative  . Hyperlipidemia      family hx  . Diabetes      1st degree relative  . Diabetes Mother   . Heart disease Mother   . Hypertension Mother   . Hyperlipidemia  Mother    History  Substance Use Topics  . Smoking status: Former Smoker    Types: Cigarettes    Quit date: 12/21/1979  . Smokeless tobacco: Never Used  . Alcohol Use: No   OB History    No data available     Review of Systems  Constitutional: Positive for fatigue. Negative for fever.  HENT: Negative for congestion and rhinorrhea.   Respiratory: Positive for shortness of breath. Negative for cough.   Cardiovascular: Negative for chest pain.  Gastrointestinal: Negative for nausea, vomiting and abdominal pain.  Neurological: Negative for loss of consciousness and headaches.  All other systems reviewed and are negative.     Allergies  Review of patient's allergies indicates no known allergies.  Home Medications   Prior to Admission medications   Medication Sig Start Date End Date Taking? Authorizing Provider  albuterol (PROVENTIL HFA;VENTOLIN HFA) 108 (90 BASE) MCG/ACT inhaler Inhale 2 puffs into the lungs every 6 (six) hours as needed for wheezing or shortness of breath.   Yes Historical Provider, MD  albuterol-ipratropium (COMBIVENT) 18-103 MCG/ACT inhaler Inhale 2 puffs into the lungs 4 (four) times daily. 01/06/14  Yes Nelwyn Salisbury, MD  atenolol (TENORMIN) 50 MG tablet Take 1 tablet (50 mg total) by mouth daily. 01/06/14  Yes Nelwyn Salisbury, MD  atorvastatin (LIPITOR) 80 MG tablet Take 1 tablet (80 mg total) by mouth daily. Patient taking differently: Take 80 mg by mouth every evening.  01/06/14  Yes Nelwyn Salisbury, MD  azelastine (ASTELIN) 137 MCG/SPRAY nasal spray Place 1 spray into both nostrils 2 (two) times daily. Use in each nostril as directed 01/06/14  Yes Nelwyn Salisbury, MD  budesonide-formoterol [facility]) 160-4.5 MCG/ACT inhaler Inhale 2 puffs into the lungs 2 (two) times daily. 01/06/14  Yes Nelwyn Salisbury, MD  citalopram (CELEXA) 10 MG tablet Take 15 mg by mouth daily.   Yes Historical Provider, MD  clindamycin (CLEOCIN) 300 MG capsule Take 300 mg by mouth 4 (four)  times daily.   Yes Historical Provider, MD  clopidogrel (PLAVIX) 75 MG tablet Take 1 tablet (75 mg total) by mouth daily. Patient taking differently: Take 75 mg by mouth at bedtime.  01/06/14  Yes Nelwyn Salisbury, MD  docusate sodium (COLACE) 100 MG capsule Take 100 mg by mouth every morning.   Yes Historical Provider, MD  enalapril (VASOTEC) 2.5 MG tablet Take 2.5 mg by mouth every morning.  04/06/14  Yes Historical Provider, MD  ferrous sulfate 325 (65 FE) MG tablet Take 325 mg by mouth every morning.   Yes Historical Provider, MD  fluticasone (FLONASE) 50 MCG/ACT nasal spray Place 2 sprays into both nostrils daily. Patient taking differently: Place 1 spray into both nostrils daily.  01/06/14  Yes Nelwyn Salisbury, MD  Fluticasone Furoate-Vilanterol 100-25 MCG/INH AEPB Inhale 1 puff into the lungs every morning.   Yes Historical Provider, MD  furosemide (LASIX) 20 MG tablet Take 1 tablet (20 mg total) by mouth daily. 01/01/14  Yes Nelwyn Salisbury, MD  HYDROcodone-acetaminophen (NORCO/VICODIN) 5-325 MG per tablet Take 1 tablet by mouth 2 (two) times daily.   Yes Historical Provider, MD  insulin glargine (LANTUS) 100 UNIT/ML injection Inject 0.2 mLs (20 Units total) into the skin at bedtime. 01/14/13  Yes Richarda Overlie, MD  insulin lispro (HUMALOG) 100 UNIT/ML KiwkPen Inject 10 Units into the skin 3 (three) times daily before meals.   Yes Historical Provider, MD  ipratropium-albuterol (DUONEB) 0.5-2.5 (3) MG/3ML SOLN Take 3 mLs by nebulization every 4 (four) hours.    Yes Historical Provider, MD  latanoprost (XALATAN) 0.005 % ophthalmic solution Place 1 drop into both eyes at bedtime.     Yes Historical Provider, MD  montelukast (SINGULAIR) 10 MG tablet Take 1 tablet (10 mg total) by mouth at bedtime. 01/06/14  Yes Nelwyn Salisbury, MD  pantoprazole (PROTONIX) 40 MG tablet Take 1 tablet (40 mg total) by mouth daily. 01/06/14  Yes Nelwyn Salisbury, MD  polyethylene glycol [facility] / GLYCOLAX) packet Take 17 g by mouth  every morning.   Yes Historical Provider, MD  pregabalin (LYRICA) 200 MG capsule Take 1 capsule (200 mg total) by mouth 2 (two) times daily. 01/06/14  Yes Nelwyn Salisbury, MD  tuberculin 5 UNIT/0.1ML injection Inject 0.1 mLs into the skin See admin instructions. Every 10 days   Yes Historical Provider, MD  Vitamin D, Ergocalciferol, (DRISDOL) 50000 UNITS CAPS capsule Take 50,000 Units by mouth every 30 (thirty) days.   Yes Historical Provider, MD  amoxicillin-clavulanate (AUGMENTIN) 600-42.9 MG/5ML suspension Take 7.5 mLs by mouth 2 (two) times daily. Patient not taking: Reported on 04/08/2015 03/26/15   Clydia Llano, MD  citalopram (CELEXA) 20 MG tablet Take 0.5 tablets (10 mg total) by mouth daily. Patient not taking: Reported  on 04/08/2015 01/06/14   Nelwyn Salisbury, MD  hydrALAZINE (APRESOLINE) 50 MG tablet Take 1 tablet (50 mg total) by mouth 2 (two) times daily. Patient not taking: Reported on 04/08/2015 01/06/14   Nelwyn Salisbury, MD   BP 145/62 mmHg  Pulse 71  Temp(Src) 98.1 F (36.7 C) (Oral)  Resp 33  SpO2 92% Physical Exam  Constitutional: She is oriented to person, place, and time. She appears well-developed and well-nourished. She is uncooperative. No distress.  Sleepy  HENT:  Head: Normocephalic and atraumatic.  Right Ear: External ear normal.  Left Ear: External ear normal.  Nose: Nose normal.  Eyes: Conjunctivae and EOM are normal. Pupils are equal, round, and reactive to light. Right eye exhibits no discharge. Left eye exhibits no discharge. No scleral icterus.  Neck: Normal range of motion. Neck supple.  Cardiovascular: Normal rate, regular rhythm and normal heart sounds.  Exam reveals no gallop and no friction rub.   No murmur heard. Pulmonary/Chest: No stridor. Tachypnea noted. She is in respiratory distress. She has decreased breath sounds ( diminished air entry). She has no wheezes. She has no rales.  Abdominal: Soft. She exhibits no distension. There is no tenderness.   Musculoskeletal: She exhibits no edema or tenderness.  Left upper and left lower extremity edema  Neurological: She is alert and oriented to person, place, and time.  Skin: Skin is warm and dry. No rash noted. She is not diaphoretic. No erythema.  Psychiatric: She has a normal mood and affect.    ED Course  Procedures (including critical care time) Labs Review Labs Reviewed  CBC WITH DIFFERENTIAL/PLATELET - Abnormal; Notable for the following:    MCH 25.9 (*)    RDW 16.9 (*)    Monocytes Relative 13 (*)    Monocytes Absolute 1.1 (*)    All other components within normal limits  COMPREHENSIVE METABOLIC PANEL - Abnormal; Notable for the following:    Sodium 150 (*)    Glucose, Bld 194 (*)    BUN 40 (*)    Creatinine, Ser 2.05 (*)    Albumin 3.2 (*)    GFR calc non Af Amer 25 (*)    GFR calc Af Amer 29 (*)    All other components within normal limits  URINALYSIS, ROUTINE W REFLEX MICROSCOPIC (NOT AT [facility]) - Abnormal; Notable for the following:    Leukocytes, UA TRACE (*)    All other components within normal limits  BLOOD GAS, ARTERIAL - Abnormal; Notable for the following:    pCO2 arterial 54.7 (*)    pO2, Arterial 416 (*)    Bicarbonate 32.8 (*)    Acid-Base Excess 7.8 (*)    All other components within normal limits  URINE MICROSCOPIC-ADD ON - Abnormal; Notable for the following:    Squamous Epithelial / LPF FEW (*)    Casts HYALINE CASTS (*)    All other components within normal limits  CBG MONITORING, ED - Abnormal; Notable for the following:    Glucose-Capillary 183 (*)    All other components within normal limits  I-STAT ARTERIAL BLOOD GAS, ED - Abnormal; Notable for the following:    pCO2 arterial 57.4 (*)    pO2, Arterial 58.0 (*)    Bicarbonate 36.9 (*)    Acid-Base Excess 11.0 (*)    All other components within normal limits  CULTURE, BLOOD (ROUTINE X 2)  CULTURE, BLOOD (ROUTINE X 2)  URINE CULTURE  CULTURE, EXPECTORATED SPUTUM-ASSESSMENT  GRAM STAIN   BRAIN  NATRIURETIC PEPTIDE  LEGIONELLA ANTIGEN, URINE  STREP PNEUMONIAE URINARY ANTIGEN  CBC WITH DIFFERENTIAL/PLATELET  COMPREHENSIVE METABOLIC PANEL  LACTIC ACID, PLASMA  LACTIC ACID, PLASMA  I-STAT TROPOININ, ED    Imaging Review Dg Chest 2 View  04/08/2015   CLINICAL DATA:  Chest pain shortness of breath. Bilateral lower extremity an abdominal swelling.  EXAM: CHEST - 2 VIEW  COMPARISON:  None.  FINDINGS: The heart is enlarged. Pulmonary vascular congestion and interstitial edema has increased. Bibasilar airspace disease is noted. The visualized soft tissues and bony thorax are unremarkable.  IMPRESSION: 1. Cardiomegaly and moderate pulmonary vascular congestion suggesting congestive heart failure. 2. Bibasilar airspace disease likely reflects acute atelectasis. Early infection is also considered.   Electronically Signed   By: Marin Roberts M.D.   On: 04/08/2015 16:37     EKG Interpretation   Date/Time:  Friday April 08 2015 15:00:56 EDT Ventricular Rate:  68 PR Interval:  130 QRS Duration: 72 QT Interval:  400 QTC Calculation: 425 R Axis:   31 Text Interpretation:  Sinus rhythm Sinus rhythm Artifact Abnormal ekg  Confirmed by Gerhard Munch  MD [phone]) on 04/08/2015 3:29:05 PM      MDM   65 year old female with a history of deafness and mutism, hypertension, diabetes, prior stroke with left-sided residual weakness, and asthma who was recently admitted for healthcare associated pneumonia discharged back to her facility on antibiotics of which she recently completed her course. She presents today from the facility with somnolence and fatigue. History was unable to be obtained from the patient's due to her lack of effort. We did have a sign language interpreter to assist. She is alert awake and oriented however minimally cooperative. The daughter showed up who was able to provide [place] with more detailed history. The daughter reported that the patient had been her normal self  this past Sunday however when she returned Wednesday she noted that the patient was more tired and less active than usual.   On arrival the patient was afebrile, normotensive, however hypoxic to the 70s on room air. She responded to 2 L nasal cannula. Chest x-ray revealed pulmonary vascular congestion concerning for CHF. The patient did have left arm and left leg edema however that the daughter reported that this is baseline for the patient. BNP was within normal limits.   After approximately an hour of nasal cannula the patient began to desat again and did not respond to increase of her O2. Nonrebreather was placed and she didn't respond to this. She again desatted on the nonrebreather. ABG revealed compensated respiratory acidosis. She was placed on BiPAP and given a dose of DuoNeb's. Patient responded well to the BiPAP and became more alert and interactive. She was taken off and placed on 2 L nasal cannula and maintained her saturations. Rest of the workup did not reveal evidence of infectious process.  She was admitted to hospitalist service for further management.  She seen in conjunction with Dr. Romeo Apple.  Final diagnoses:  Acute respiratory failure with hypoxia  Acute respiratory failure with hypercapnia        Drema Pry, MD 04/08/15 2357  Purvis Sheffield, MD 04/09/15 1209

## 2015-04-08 NOTE — H&P (Signed)
[facility] History and Physical  Patient: Valerie Rodriguez  MRN: [id number]  DOB: 12-Mar-1951  DOS: the patient was seen and examined on 04/08/2015 PCP: No PCP Per Patient  Referring physician: Dr. Romeo Apple Chief Complaint: Fatigue  HPI: Valerie Rodriguez is a 65 y.o. female with Past medical history of hypertension, deaf-mutism, CVA with residual left-sided weakness, diabetes mellitus type 2, asthma, dyslipidemia, peripheral vascular disease. The patient is a resident of [facility]. Patient was brought from there as they noted she was increasingly fatigued and sleepy. With the help of sign language interpreter the patient informed that she has been having vomiting, diarrhea, fatigue. But she wasn't able to spell out her name or her daughters name or her date of birth. Reportedly why she was here in the ER initially brought on 2 L but later on she become hypoxic to 70s requiring nonrebreather and then was placed on BiPAP. After placing her BiPAP her mentation improved significantly and she was more alert and interactive.  As for my discussion with the nursing home staff she has been having generalized Weakness since last 3-4 days and noted to have some crackling on her lung sounds. There was no Vomiting. as well as no fall,  she had one episode of Loose stool 3 days ago, but no diarrhea. She is is on pured diet diet but has not been eating since last 2 days Her last weight was 208.5 June 1st. She has completed her course of Augmentin and that is no recent change in her medications reported.  as per my discussion with daughter over the phone the patient has been herself until Sunday and then she returned to see her 3 days ago she found that she was more fatigued and tired.   The patient is coming from SNF.  At her baseline ambulates bedridden And is dependent for most of her ADL does not manage her medication on her own.  Review of Systems: as mentioned in the history of  present illness.  A comprehensive review of the other systems is negative.  Past Medical History  Diagnosis Date  . Hypertension   . Hyperlipemia   . Deaf-mutism   . Glaucoma     Dr Eulah Pont  . Diabetes mellitus     Type II, sees Dr. Candie Chroman  . Allergy   . Asthma     sees Dr [place] Callas  . Stroke 11-05-10    sees Dr. Pearlean Brownie  . Chronic kidney disease     sees Dr. Camille Bal   . PVD (peripheral vascular disease)     sees Dr. Hart [place]   . PAD (peripheral artery disease)    Past Surgical History  Procedure Laterality Date  . Cholecystectomy    . Abdominal hysterectomy      oophorectomy 1976  . Rotator cuff repair  2004  . Colonoscopy  03-30-09    per Dr. Marina Goodell, poor prep, repeat one yr    Social History:  reports that she quit smoking about 35 years ago. Her smoking use included Cigarettes. She has never used smokeless tobacco. She reports that she does not drink alcohol or use illicit drugs.  No Known Allergies  Family History  Problem Relation Age of Onset  . Hypertension      family hx  . Coronary artery disease      female 1st degree relative  . Hyperlipidemia      family hx  . Diabetes      1st degree relative  .  Diabetes Mother   . Heart disease Mother   . Hypertension Mother   . Hyperlipidemia Mother     Prior to Admission medications   Medication Sig Start Date End Date Taking? Authorizing Provider  albuterol (PROVENTIL HFA;VENTOLIN HFA) 108 (90 BASE) MCG/ACT inhaler Inhale 2 puffs into the lungs every 6 (six) hours as needed for wheezing or shortness of breath.   Yes Historical Provider, MD  albuterol-ipratropium (COMBIVENT) 18-103 MCG/ACT inhaler Inhale 2 puffs into the lungs 4 (four) times daily. 01/06/14  Yes Nelwyn Salisbury, MD  atenolol (TENORMIN) 50 MG tablet Take 1 tablet (50 mg total) by mouth daily. 01/06/14  Yes Nelwyn Salisbury, MD  atorvastatin (LIPITOR) 80 MG tablet Take 1 tablet (80 mg total) by mouth daily. Patient taking differently: Take 80 mg  by mouth every evening.  01/06/14  Yes Nelwyn Salisbury, MD  azelastine (ASTELIN) 137 MCG/SPRAY nasal spray Place 1 spray into both nostrils 2 (two) times daily. Use in each nostril as directed 01/06/14  Yes Nelwyn Salisbury, MD  budesonide-formoterol [facility]) 160-4.5 MCG/ACT inhaler Inhale 2 puffs into the lungs 2 (two) times daily. 01/06/14  Yes Nelwyn Salisbury, MD  citalopram (CELEXA) 10 MG tablet Take 15 mg by mouth daily.   Yes Historical Provider, MD  clindamycin (CLEOCIN) 300 MG capsule Take 300 mg by mouth 4 (four) times daily.   Yes Historical Provider, MD  clopidogrel (PLAVIX) 75 MG tablet Take 1 tablet (75 mg total) by mouth daily. Patient taking differently: Take 75 mg by mouth at bedtime.  01/06/14  Yes Nelwyn Salisbury, MD  docusate sodium (COLACE) 100 MG capsule Take 100 mg by mouth every morning.   Yes Historical Provider, MD  enalapril (VASOTEC) 2.5 MG tablet Take 2.5 mg by mouth every morning.  04/06/14  Yes Historical Provider, MD  ferrous sulfate 325 (65 FE) MG tablet Take 325 mg by mouth every morning.   Yes Historical Provider, MD  fluticasone (FLONASE) 50 MCG/ACT nasal spray Place 2 sprays into both nostrils daily. Patient taking differently: Place 1 spray into both nostrils daily.  01/06/14  Yes Nelwyn Salisbury, MD  Fluticasone Furoate-Vilanterol 100-25 MCG/INH AEPB Inhale 1 puff into the lungs every morning.   Yes Historical Provider, MD  furosemide (LASIX) 20 MG tablet Take 1 tablet (20 mg total) by mouth daily. 01/01/14  Yes Nelwyn Salisbury, MD  HYDROcodone-acetaminophen (NORCO/VICODIN) 5-325 MG per tablet Take 1 tablet by mouth 2 (two) times daily.   Yes Historical Provider, MD  insulin glargine (LANTUS) 100 UNIT/ML injection Inject 0.2 mLs (20 Units total) into the skin at bedtime. 01/14/13  Yes Richarda Overlie, MD  insulin lispro (HUMALOG) 100 UNIT/ML KiwkPen Inject 10 Units into the skin 3 (three) times daily before meals.   Yes Historical Provider, MD  ipratropium-albuterol (DUONEB) 0.5-2.5  (3) MG/3ML SOLN Take 3 mLs by nebulization every 4 (four) hours.    Yes Historical Provider, MD  latanoprost (XALATAN) 0.005 % ophthalmic solution Place 1 drop into both eyes at bedtime.     Yes Historical Provider, MD  montelukast (SINGULAIR) 10 MG tablet Take 1 tablet (10 mg total) by mouth at bedtime. 01/06/14  Yes Nelwyn Salisbury, MD  pantoprazole (PROTONIX) 40 MG tablet Take 1 tablet (40 mg total) by mouth daily. 01/06/14  Yes Nelwyn Salisbury, MD  polyethylene glycol [facility] / GLYCOLAX) packet Take 17 g by mouth every morning.   Yes Historical Provider, MD  pregabalin (LYRICA) 200 MG capsule Take  1 capsule (200 mg total) by mouth 2 (two) times daily. 01/06/14  Yes Nelwyn Salisbury, MD  tuberculin 5 UNIT/0.1ML injection Inject 0.1 mLs into the skin See admin instructions. Every 10 days   Yes Historical Provider, MD  Vitamin D, Ergocalciferol, (DRISDOL) 50000 UNITS CAPS capsule Take 50,000 Units by mouth every 30 (thirty) days.   Yes Historical Provider, MD  amoxicillin-clavulanate (AUGMENTIN) 600-42.9 MG/5ML suspension Take 7.5 mLs by mouth 2 (two) times daily. Patient not taking: Reported on 04/08/2015 03/26/15   Clydia Llano, MD  citalopram (CELEXA) 20 MG tablet Take 0.5 tablets (10 mg total) by mouth daily. Patient not taking: Reported on 04/08/2015 01/06/14   Nelwyn Salisbury, MD  hydrALAZINE (APRESOLINE) 50 MG tablet Take 1 tablet (50 mg total) by mouth 2 (two) times daily. Patient not taking: Reported on 04/08/2015 01/06/14   Nelwyn Salisbury, MD    Physical Exam: Filed Vitals:   04/08/15 2030 04/08/15 2045 04/08/15 2100 04/08/15 2115  BP: 147/59 125/55 145/62   Pulse: 71 70 72 71  Temp:      TempSrc:      Resp: 18 16 23  33  SpO2: 95% 94% 92% 92%    General: Alert, Awake and Oriented to  Place,  Appear in mild distress and lethargic  Eyes: PERRL ENT: Oral Mucosa clear moist. Neck:  difficult to assess JVD Cardiovascular: S1 and S2 Present, no Murmur, Peripheral Pulses Present Respiratory:  Bilateral Air entry equal and Decreased,  Bilateral basal  Crackles, no wheezes Abdomen: Bowel Sound present, Soft and non tender Skin: no Rash Extremities:  bilateral  Pedal edema, no calf tenderness Neurologic: chronic left-sided weakness   Labs on Admission:  CBC:  Recent Labs Lab 04/08/15 1558  WBC 8.0  NEUTROABS 4.7  HGB 12.1  HCT 37.6  MCV 80.5  PLT 266    CMP     Component Value Date/Time   NA 150* 04/08/2015 1558   K 4.9 04/08/2015 1558   CL 109 04/08/2015 1558   CO2 32 04/08/2015 1558   GLUCOSE 194* 04/08/2015 1558   BUN 40* 04/08/2015 1558   CREATININE 2.05* 04/08/2015 1558   CALCIUM 9.8 04/08/2015 1558   PROT 7.4 04/08/2015 1558   ALBUMIN 3.2* 04/08/2015 1558   AST 33 04/08/2015 1558   ALT 15 04/08/2015 1558   ALKPHOS 62 04/08/2015 1558   BILITOT 0.5 04/08/2015 1558   GFRNONAA 25* 04/08/2015 1558   GFRAA 29* 04/08/2015 1558    No results for input(s): LIPASE, AMYLASE in the last 168 hours.  No results for input(s): CKTOTAL, CKMB, CKMBINDEX, TROPONINI in the last 168 hours. BNP (last 3 results)  Recent Labs  04/08/15 1558  BNP 27.2    ProBNP (last 3 results) No results for input(s): PROBNP in the last 8760 hours.   Radiological Exams on Admission: Dg Chest 2 View  04/08/2015   CLINICAL DATA:  Chest pain shortness of breath. Bilateral lower extremity an abdominal swelling.  EXAM: CHEST - 2 VIEW  COMPARISON:  None.  FINDINGS: The heart is enlarged. Pulmonary vascular congestion and interstitial edema has increased. Bibasilar airspace disease is noted. The visualized soft tissues and bony thorax are unremarkable.  IMPRESSION: 1. Cardiomegaly and moderate pulmonary vascular congestion suggesting congestive heart failure. 2. Bibasilar airspace disease likely reflects acute atelectasis. Early infection is also considered.   Electronically Signed   By: Marin Roberts M.D.   On: 04/08/2015 16:37   EKG: Independently reviewed. normal sinus rhythm,  nonspecific ST  and T waves changes.  Assessment/Plan Principal Problem:   Acute respiratory failure Active Problems:   DM2 (diabetes mellitus, type 2)   Essential hypertension   GERD   Bilateral lower extremity edema   HCAP (healthcare-associated pneumonia)   Dysphagia   History of CVA with residual deficit   AKI (acute kidney injury)   Hypernatremia   1. Acute respiratory failure Patient is presenting with compressive progressively worsening hypoxia. Patient was recently hospitalized for healthcare associated pneumonia with similar presentation. She has been on BiPAP with significant improvement in her mentation as per the ED physician. Her weight is 189 down from 208 June 1. Her ABG shows hypercarbia without any acute component. Her chest x-ray shows bilateral atelectasis as well as possible early infection. She also has pulmonary vascular congestion on chest x-ray. She has lactic acidosis and her current oxygen requirement is 3-4 L. With this finding it appears that her hypoxia is most likely secondary to atelectasis although recurrent pneumonia cannot be ruled out. She can also have recurrent aspiration pneumonitis. At present with elevated lactic acidosis and hypoxia I'm inclined to treat her as possible healthcare associated pneumonia with broad-spectrum antibiotics. I would use incentive spirometry in the morning as well as flutter device although patient's compliance with such treatment may not be adequate and she may require chest physical therapy for atelectasis. I would check d-dimer and if elevated will check VQ scan. Although recent CT PE protocol was negative for any pulmonary embolism.  2.Acute encephalopathy. The patient has been presenting with increasing weakness confusion as well as disorientation. Check CT scan she does not appear to have any new focal deficit. Possibly hypercarbia. She does not appear to have any UTI at present. Check TSH.  3.Acute kidney  injury. Hypernatremia. Most likely in the setting of poor oral intake, ongoing use of Lasix, enalapril. We will give her half-normal saline. Monitor renal function and avoid nephrotoxic medications.  4. dysphagia. Patient has not been eating since last few days secondary to increased fatigue. She also appears to have possible recurrent aspiration. She had extensive evaluation last admission for speech therapy. At present I'm inclined to recheck her swallowing function.  5. diabetes mellitus type 2. Currently the patient remains nothing by mouth and therefore I would use sliding scale insulin.  6. history of asthma. At present clinically she does not appear to have any asthma exacerbation. I would use DuoNeb's she is already on antibiotics and will continue her home inhalers.  7. Obesity Patient recent BMI is 40+, Patient is also at high risk for obesity hypoventilation syndrome which can explain her chronic hypercarbia with increasing somnolence and fatigue. May benefit from outpatient sleep assessment BiPAP when necessary   8. Essential hypertension. Blood pressure at present stable therefore holding her home blood pressure medication in view of AKI and elevated lactic acid.  Advance goals of care discussion: full code as per my discussion with patient's daughter over phone    DVT Prophylaxis: subcutaneous Heparin Nutrition: nothing by mouth  Family Communication: Discussed with patient's daughter over phone, opportunity was given to ask question and all questions were answered satisfactorily at the time of interview. Disposition: Admitted as inpatient, step-down unit.  Author: Lynden Oxford, MD [facility] Pager: [phone] 04/08/2015  If 7PM-7AM, please contact night-coverage [email or web address] Password [email or web address]

## 2015-04-08 NOTE — ED Notes (Signed)
Pt unable to tolerate lying flat at this time for in and out catheter

## 2015-04-08 NOTE — ED Notes (Signed)
Secretary called for interpreter.

## 2015-04-08 NOTE — ED Notes (Signed)
MD at bedside. 

## 2015-04-08 NOTE — ED Notes (Signed)
MD made aware that pts SPO2 is 80% on NRB. Pt positioned upright and encouraged to take deep breaths. MD to evaluate. This RN called Lab to add on BNP.

## 2015-04-09 ENCOUNTER — Inpatient Hospital Stay (HOSPITAL_COMMUNITY): Payer: Medicare Other

## 2015-04-09 DIAGNOSIS — N179 Acute kidney failure, unspecified: Secondary | ICD-10-CM | POA: Diagnosis present

## 2015-04-09 DIAGNOSIS — J9602 Acute respiratory failure with hypercapnia: Secondary | ICD-10-CM

## 2015-04-09 DIAGNOSIS — I6930 Unspecified sequelae of cerebral infarction: Secondary | ICD-10-CM

## 2015-04-09 DIAGNOSIS — E870 Hyperosmolality and hypernatremia: Secondary | ICD-10-CM | POA: Diagnosis present

## 2015-04-09 LAB — CBC WITH DIFFERENTIAL/PLATELET
BASOS ABS: 0 10*3/uL (ref 0.0–0.1)
Basophils Relative: 0 % (ref 0–1)
Eosinophils Absolute: 0.1 10*3/uL (ref 0.0–0.7)
Eosinophils Relative: 1 % (ref 0–5)
HCT: 36.9 % (ref 36.0–46.0)
Hemoglobin: 11.6 g/dL — ABNORMAL LOW (ref 12.0–15.0)
LYMPHS ABS: 1.9 10*3/uL (ref 0.7–4.0)
Lymphocytes Relative: 26 % (ref 12–46)
MCH: 25.6 pg — ABNORMAL LOW (ref 26.0–34.0)
MCHC: 31.4 g/dL (ref 30.0–36.0)
MCV: 81.5 fL (ref 78.0–100.0)
Monocytes Absolute: 0.4 10*3/uL (ref 0.1–1.0)
Monocytes Relative: 6 % (ref 3–12)
NEUTROS PCT: 67 % (ref 43–77)
Neutro Abs: 5 10*3/uL (ref 1.7–7.7)
Platelets: 240 10*3/uL (ref 150–400)
RBC: 4.53 MIL/uL (ref 3.87–5.11)
RDW: 16.9 % — ABNORMAL HIGH (ref 11.5–15.5)
WBC: 7.4 10*3/uL (ref 4.0–10.5)

## 2015-04-09 LAB — COMPREHENSIVE METABOLIC PANEL
ALT: 16 U/L (ref 14–54)
ALT: 16 U/L (ref 14–54)
AST: 41 U/L (ref 15–41)
AST: 37 U/L (ref 15–41)
Albumin: 3.1 g/dL — ABNORMAL LOW (ref 3.5–5.0)
Albumin: 3 g/dL — ABNORMAL LOW (ref 3.5–5.0)
Alkaline Phosphatase: 57 U/L (ref 38–126)
Alkaline Phosphatase: 57 U/L (ref 38–126)
Anion gap: 12 (ref 5–15)
Anion gap: 10 (ref 5–15)
BUN: 44 mg/dL — AB (ref 6–20)
BUN: 44 mg/dL — ABNORMAL HIGH (ref 6–20)
CALCIUM: 9.4 mg/dL (ref 8.9–10.3)
CALCIUM: 9.5 mg/dL (ref 8.9–10.3)
CHLORIDE: 105 mmol/L (ref 101–111)
CHLORIDE: 107 mmol/L (ref 101–111)
CO2: 29 mmol/L (ref 22–32)
CO2: 31 mmol/L (ref 22–32)
Creatinine, Ser: 2.2 mg/dL — ABNORMAL HIGH (ref 0.44–1.00)
Creatinine, Ser: 2.06 mg/dL — ABNORMAL HIGH (ref 0.44–1.00)
GFR calc Af Amer: 26 mL/min — ABNORMAL LOW (ref >60)
GFR calc Af Amer: 28 mL/min — ABNORMAL LOW (ref >60)
GFR calc non Af Amer: 23 mL/min — ABNORMAL LOW (ref >60)
GFR calc non Af Amer: 24 mL/min — ABNORMAL LOW (ref >60)
GLUCOSE: 389 mg/dL — AB (ref 65–99)
GLUCOSE: 365 mg/dL — AB (ref 65–99)
POTASSIUM: 5.4 mmol/L — AB (ref 3.5–5.1)
POTASSIUM: 5.1 mmol/L (ref 3.5–5.1)
Sodium: 146 mmol/L — ABNORMAL HIGH (ref 135–145)
Sodium: 148 mmol/L — ABNORMAL HIGH (ref 135–145)
TOTAL PROTEIN: 6.9 g/dL (ref 6.5–8.1)
Total Bilirubin: 0.6 mg/dL (ref 0.3–1.2)
Total Bilirubin: 0.4 mg/dL (ref 0.3–1.2)
Total Protein: 6.9 g/dL (ref 6.5–8.1)

## 2015-04-09 LAB — GLUCOSE, CAPILLARY
GLUCOSE-CAPILLARY: 212 mg/dL — AB (ref 65–99)
GLUCOSE-CAPILLARY: 260 mg/dL — AB (ref 65–99)
Glucose-Capillary: 353 mg/dL — ABNORMAL HIGH (ref 65–99)
Glucose-Capillary: 256 mg/dL — ABNORMAL HIGH (ref 65–99)
Glucose-Capillary: 206 mg/dL — ABNORMAL HIGH (ref 65–99)

## 2015-04-09 LAB — TROPONIN I: Troponin I: <0.03 ng/mL (ref <0.031)

## 2015-04-09 LAB — TSH: TSH: 1.58 u[IU]/mL (ref 0.350–4.500)

## 2015-04-09 LAB — LACTIC ACID, PLASMA
LACTIC ACID, VENOUS: 3.7 mmol/L — AB (ref 0.5–2.0)
Lactic Acid, Venous: 3.3 mmol/L (ref 0.5–2.0)

## 2015-04-09 LAB — INFLUENZA PANEL BY PCR (TYPE A & B)
H1N1FLUPCR: NOT DETECTED
Influenza A By PCR: NEGATIVE
Influenza B By PCR: NEGATIVE

## 2015-04-09 LAB — D-DIMER, QUANTITATIVE (NOT AT ARMC): D-Dimer, Quant: 2.05 ug/mL-FEU — ABNORMAL HIGH (ref 0.00–0.48)

## 2015-04-09 LAB — PROCALCITONIN: Procalcitonin: <0.1 ng/mL

## 2015-04-09 MED ORDER — TECHNETIUM TO 99M ALBUMIN AGGREGATED
3.0000 | Freq: Once | INTRAVENOUS | Status: AC | PRN
  Administered 2015-04-09: 2.5 via INTRAVENOUS

## 2015-04-09 MED ORDER — ACETAMINOPHEN 325 MG PO TABS
650.0000 mg | ORAL_TABLET | Freq: Four times a day (QID) | ORAL | Status: DC | PRN
  Administered 2015-04-09 – 2015-04-11 (×2): 650 mg via ORAL
  Filled 2015-04-09 (×2): qty 2

## 2015-04-09 MED ORDER — SODIUM CHLORIDE 0.45 % IV SOLN
INTRAVENOUS | Status: DC
  Administered 2015-04-09 (×2): via INTRAVENOUS

## 2015-04-09 MED ORDER — IPRATROPIUM-ALBUTEROL 0.5-2.5 (3) MG/3ML IN SOLN: 3.0000 mL | RESPIRATORY_TRACT | Status: DC | PRN

## 2015-04-09 MED ORDER — PIPERACILLIN-TAZOBACTAM 3.375 G IVPB
3.3750 g | Freq: Three times a day (TID) | INTRAVENOUS | Status: DC
  Administered 2015-04-09 – 2015-04-12 (×10): 3.375 g via INTRAVENOUS
  Filled 2015-04-09 (×13): qty 50

## 2015-04-09 MED ORDER — STARCH (THICKENING) PO POWD
ORAL | Status: DC | PRN
  Filled 2015-04-09: qty 227

## 2015-04-09 MED ORDER — INSULIN ASPART 100 UNIT/ML ~~LOC~~ SOLN
0.0000 [IU] | Freq: Four times a day (QID) | SUBCUTANEOUS | Status: DC
  Administered 2015-04-09: 11 [IU] via SUBCUTANEOUS
  Administered 2015-04-09: 7 [IU] via SUBCUTANEOUS
  Administered 2015-04-09 – 2015-04-10 (×3): 11 [IU] via SUBCUTANEOUS
  Administered 2015-04-10: 7 [IU] via SUBCUTANEOUS
  Administered 2015-04-10: 4 [IU] via SUBCUTANEOUS
  Administered 2015-04-11: 7 [IU] via SUBCUTANEOUS
  Administered 2015-04-11: 11 [IU] via SUBCUTANEOUS
  Administered 2015-04-11: 7 [IU] via SUBCUTANEOUS
  Administered 2015-04-11 – 2015-04-12 (×2): 11 [IU] via SUBCUTANEOUS
  Administered 2015-04-12: 4 [IU] via SUBCUTANEOUS
  Administered 2015-04-12: 11 [IU] via SUBCUTANEOUS

## 2015-04-09 MED ORDER — VANCOMYCIN HCL IN DEXTROSE 1-5 GM/200ML-% IV SOLN
1000.0000 mg | INTRAVENOUS | Status: DC
  Administered 2015-04-09 – 2015-04-12 (×4): 1000 mg via INTRAVENOUS
  Filled 2015-04-09 (×5): qty 200

## 2015-04-09 NOTE — Progress Notes (Signed)
Patient ID: Valerie Rodriguez, female   DOB: [birth year], 64 y.o.   MRN: [id number]  [facility] PROGRESS NOTE  Valerie Rodriguez KGU:[id number] DOB: January 09, 1951 DOA: 04/08/2015 PCP: No PCP Per Patient   Brief narrative:    64 y.o. female with HTN, CVA with residual left sided weakness, DM type II, HLD, PVD, CKD stage II - III, resident of [facility], brought to [facility] ED for evaluation of progressive lethargy, nausea, non bloody vomiting, diarrhea. In ED, pt became hypoxic with oxygen saturation in 70's requiring placement on BiPAP. CXR worrisome for developing PNA. At base line pt is depended on most of her ADL. TR[facility] asked to admit to SDU.   Assessment/Plan:    Acute hypoxic respiratory failure with hypercarbia  - secondary to HCAP and acute on chronic diastolic CHF - started on vancomycin and zosyn - continue same ABX regimen day #2 - pt looks better and more alert this AM - IS while awake, pul hygiene  Acute on chronic diastolic CHF - last 2 D ECHO in 2014 with grade I diastolic CHF - stop IVF, monitor weight, strict I/O - request repeat 2 D ECHO - weight today is 85.3 kg   Sepsis due to HCAP - pt met criteria for sepsis with T 100 F, RR up to 33, lactic acid > 3, source HCAP - vanc and zosyn as noted above, day #2 - urine legionella and strep pneumo pending  - follow up on blood culture   Acute encephalopathy  - secondary to problems outlined above, hypernatremia - mental status more clear this AM - will need SLP and PT   Acute kidney failure imposed on CKD stage II - III based on GFR - review of records indicate baseline Cr as high as 1.4 in the past year  - secondary to pre renal etiology - IVF have been provided but due to pulm vasc congestion, will stop IVF - repeat BMP in AM  Hypernatremia - Most likely in the setting of poor oral intake, ongoing use of Lasix, enalapril. - IVF provided and Na is 148 this AM - will repeat BMP in AM  Hyperkalemia -  resolved, repeat BMP in AM  Dysphagia. - SLP requested   Diabetes mellitus type 2, with complication of PVD and CKD - SSI for now only until oral intake improves   Asthma. - At present clinically she does not appear to have any asthma exacerbation.  Morbid obesity  - Body mass index is 40.68 kg/(m^2).   DVT prophylaxis - Heparin SQ  Code Status: Full.  Family Communication:  plan of care discussed with the patient Disposition Plan: Keep in SDU  IV access:  Peripheral IV  Procedures and diagnostic studies:    Dg Chest 2 View 04/08/2015 Cardiomegaly and moderate pulmonary vascular congestion suggesting congestive heart failure. 2. Bibasilar airspace disease likely reflects acute atelectasis. Early infection is also considered.   Ct Head Wo Contrast 04/09/2015  Chronic atrophic and ischemic changes. No acute intracranial abnormality is noted.    Nm Pulmonary Perfusion 04/09/2015  No perfusion defects identified to suggest acute pulmonary embolus. 2. Low lung volumes.   Medical Consultants:  None   Other Consultants:  None  IAnti-Infectives:   Vancomycin 6/24 --> Zosyn 6/24 -->   Faye Ramsay, MD  [facility] Pager [phone]  If 7PM-7AM, please contact night-coverage [email or web address] Password [email or web address] 04/09/2015, 3:34 PM   LOS: 1 day   HPI/Subjective: No events overnight.   Objective: Filed Vitals:  04/09/15 0738 04/09/15 0818 04/09/15 1235 04/09/15 1522  BP:  142/63 161/65   Pulse: 61 67 63 63  Temp:  98.1 F (36.7 C) 97.1 F (36.2 C)   TempSrc:  Rectal Axillary   Resp: _0 Weight:      SpO2: 98% 98% 97% 97%    Intake/Output Summary (Last 24 hours) at 04/09/15 1534 Last data filed at 04/09/15 1400  Gross per 24 hour  Intake 1112.5 ml  Output      0 ml  Net 1112.5 ml    Exam:   General:  Pt is alert, follows simple commands appropriately, not in acute distress, deaf  Cardiovascular: Regular rate and rhythm, S1/S2, no murmurs, no rubs, no  gallops  Respiratory: Clear to auscultation bilaterally, no wheezing, crackles and rhonchi at bases   Abdomen: Soft, non tender, non distended, bowel sounds present, no guarding   Data Reviewed: Basic Metabolic Panel:  Recent Labs Lab 04/08/15 1558 04/08/15 2310 04/09/15 0220  NA 150* 146* 148*  K 4.9 5.4* 5.1  CL 109 105 107  CO2 32 29 31  GLUCOSE 194* 389* 365*  BUN 40* 44* 44*  CREATININE 2.05* 2.20* 2.06*  CALCIUM 9.8 9.4 9.5   Liver Function Tests:  Recent Labs Lab 04/08/15 1558 04/08/15 2310 04/09/15 0220  AST 33 41 37  ALT _1 ALKPHOS 62 57 57  BILITOT 0.5 0.6 0.4  PROT 7.4 6.9 6.9  ALBUMIN 3.2* 3.1* 3.0*   CBC:  Recent Labs Lab 04/08/15 1558 04/08/15 2310 04/09/15 0220  WBC 8.0 7.8 7.4  NEUTROABS 4.7 5.4 5.0  HGB 12.1 11.3* 11.6*  HCT 37.6 35.2* 36.9  MCV 80.5 80.9 81.5  PLT 266 240 240   Cardiac Enzymes:  Recent Labs Lab 04/08/15 2310  TROPONINI <0.03   BNP: Invalid input(s): POCBNP CBG:  Recent Labs Lab 04/08/15 1509 04/09/15 0120 04/09/15 0558 04/09/15 0815 04/09/15 1234  GLUCAP 183* 353* 256* 212* 206*    Recent Results (from the past 240 hour(s))  Blood culture (routine x 2)     Status: None (Preliminary result)   Collection Time: 04/08/15  4:00 PM  Result Value Ref Range Status   Specimen Description BLOOD LEFT HAND  Final   Special Requests BOTTLES DRAWN AEROBIC AND ANAEROBIC 5CC  Final   Culture NO GROWTH < 24 HOURS  Final   Report Status PENDING  Incomplete  Blood culture (routine x 2)     Status: None (Preliminary result)   Collection Time: 04/08/15  4:05 PM  Result Value Ref Range Status   Specimen Description BLOOD RIGHT HAND  Final   Special Requests BOTTLES DRAWN AEROBIC AND ANAEROBIC 5CC  Final   Culture NO GROWTH < 24 HOURS  Final   Report Status PENDING  Incomplete     Scheduled Meds: . atorvastatin  80 mg Oral QPM  . budesonide-formoterol  2 puff Inhalation BID  . citalopram  15 mg Oral Daily   . clopidogrel  75 mg Oral Daily  . fluticasone  2 spray Each Nare Daily  . heparin  5,000 Units Subcutaneous 3 times per day  . insulin aspart  0-20 Units Subcutaneous 4 times per day  . ipratropium-albuterol  3 mL Nebulization Q4H  . pantoprazole  40 mg Oral Daily  . piperacillin-tazobactam (ZOSYN)  IV  3.375 g Intravenous 3 times per day  . pregabalin  200 mg Oral BID  . vancomycin  1,000 mg Intravenous Q24H  Continuous Infusions: . sodium chloride 75 mL/hr at 04/09/15 0310

## 2015-04-09 NOTE — Evaluation (Signed)
Clinical/Bedside Swallow Evaluation Patient Details  Name: Valerie Rodriguez MRN: [id number] Date of Birth: 1951-01-05  Today's Date: 04/09/2015 Time: SLP Start Time (ACUTE ONLY): 0907 SLP Stop Time (ACUTE ONLY): 0936 SLP Time Calculation (min) (ACUTE ONLY): 29 min  Past Medical History:  Past Medical History  Diagnosis Date  . Hypertension   . Hyperlipemia   . Deaf-mutism   . Glaucoma     Dr Eulah Pont  . Diabetes mellitus     Type II, sees Dr. Candie Chroman  . Allergy   . Asthma     sees Dr Quinn Callas  . Stroke 11-05-10    sees Dr. Pearlean Brownie  . Chronic kidney disease     sees Dr. Camille Bal   . PVD (peripheral vascular disease)     sees Dr. Hart Rochester   . PAD (peripheral artery disease)    Past Surgical History:  Past Surgical History  Procedure Laterality Date  . Cholecystectomy    . Abdominal hysterectomy      oophorectomy 1976  . Rotator cuff repair  2004  . Colonoscopy  03-30-09    per Dr. Marina Goodell, poor prep, repeat one yr    HPI:  Valerie Rodriguez is a 64 y.o. female with Past medical history of hypertension, deaf-mutism, CVA with residual left-sided weakness, diabetes mellitus type 2, asthma, dyslipidemia, peripheral vascular disease, and oral pharyngeal dysphagia with history of aspiration PNA.   Assessment / Plan / Recommendation Clinical Impression  Bedside swallow evaluation completed with the help of an interpretor using American Sign Language. Pt alert and responsive through nonverbal head nod and head shake. Pt not overtly showing signs or symptoms of aspiration clinically this date. Pt exhibited difficulty initiating anterior to posterior transfer of puree bolus with suspected delayed initiation of swallowing. No coughing or throat clears following thin or nectar thick consistencies. However patient with history of aspiration PNA and known oral pharyngeal dysphagia. Most recent objective swallow study completed, FEES, on 03-23-15 significant for penetration of nectar thick  liquids and aspiration of thin liquids with some sensed aspiration during the swallow and questionable silent aspiration of thins. Based upon patients fluctuating mentation, history of aspiration PNA with moderate oral pharyngeal dysphagia, recommend diet implementation of dysphagia 1 with nectar thick liquids. Medicines whole with puree. Repeat objective swallow study may be indicated in the future if patient continues to show tolerance of current diet. ST follow up warranted.      Aspiration Risk  Moderate    Diet Recommendation Dysphagia 1 (Puree);Nectar   Medication Administration: Whole meds with puree Compensations: Slow rate;Small sips/bites    Other  Recommendations Oral Care Recommendations: Oral care BID Other Recommendations: Order thickener from pharmacy   Follow Up Recommendations       Frequency and Duration min 2x/week  2 weeks   Pertinent Vitals/Pain Denies pain    SLP Swallow Goals     Swallow Study Prior Functional Status       General Date of Onset: 04/08/15 Other Pertinent Information: Valerie Rodriguez is a 64 y.o. female with Past medical history of hypertension, deaf-mutism, CVA with residual left-sided weakness, diabetes mellitus type 2, asthma, dyslipidemia, peripheral vascular disease, and oral pharyngeal dysphagia with history of aspiration PNA. Previous Swallow Assessment: FEES: 03-23-15 Diet Prior to this Study: NPO Temperature Spikes Noted: Yes Respiratory Status: Supplemental O2 delivered via (comment) History of Recent Intubation: No Behavior/Cognition: Alert;Cooperative;Pleasant mood Oral Cavity - Dentition: Edentulous Self-Feeding Abilities: Total assist Patient Positioning: Upright in bed Baseline Vocal Quality:  Low vocal intensity Volitional Cough: Weak Volitional Swallow: Able to elicit    Oral/Motor/Sensory Function Overall Oral Motor/Sensory Function: Impaired at baseline Labial ROM: Reduced right;Reduced left Labial Strength:  Reduced Lingual ROM: Reduced right;Reduced left Lingual Strength: Reduced Facial ROM: Reduced left;Reduced right Facial Strength: Reduced   Ice Chips Ice chips: Impaired Presentation: Spoon Oral Phase Impairments: Reduced lingual movement/coordination;Impaired anterior to posterior transit Oral Phase Functional Implications: Prolonged oral transit;Oral residue Pharyngeal Phase Impairments: Suspected delayed Swallow   Thin Liquid Thin Liquid: Impaired Presentation: Spoon Oral Phase Impairments: Reduced lingual movement/coordination Oral Phase Functional Implications: Prolonged oral transit Pharyngeal  Phase Impairments: Suspected delayed Swallow    Nectar Thick Nectar Thick Liquid: Within functional limits Presentation: Cup;Spoon   Honey Thick Honey Thick Liquid: Not tested   Puree Puree: Impaired Presentation: Spoon Oral Phase Impairments: Reduced lingual movement/coordination;Impaired anterior to posterior transit Oral Phase Functional Implications: Prolonged oral transit;Oral residue;Oral holding Pharyngeal Phase Impairments: Suspected delayed Swallow   Solid   GO    Solid: Not tested      Marcene Duos MA, CCC-SLP Acute Care Speech Language Pathologist    Marcene Duos E 04/09/2015,9:55 AM

## 2015-04-09 NOTE — Progress Notes (Signed)
Language interpretor here - helped assess pt Speech therapist able to evaluate - Pt uses American sign language

## 2015-04-09 NOTE — Progress Notes (Signed)
ANTIBIOTIC CONSULT NOTE - INITIAL  Pharmacy Consult for Vancomycin/Zosyn  Indication: rule out pneumonia and rule out sepsis  No Known Allergies  Patient Measurements: Weight: 188 lb 0.8 oz (85.3 kg)  Vital Signs: Temp: 100 F (37.8 C) (06/25 0051) Temp Source: Axillary (06/25 0500) BP: 165/70 mmHg (06/25 0500) Pulse Rate: 72 (06/25 0015) Intake/Output from previous day: 06/24 0701 - 06/25 0700 In: 337.5 [I.V.:137.5; IV Piggyback:200] Out: -  Intake/Output from this shift: Total I/O In: 337.5 [I.V.:137.5; IV Piggyback:200] Out: -   Labs:  Recent Labs  04/08/15 1558 04/08/15 2310 04/09/15 0220  WBC 8.0 7.8 7.4  HGB 12.1 11.3* 11.6*  PLT 266 240 240  CREATININE 2.05* 2.20* 2.06*   Estimated Creatinine Clearance: 25.3 mL/min (by C-G formula based on Cr of 2.06). No results for input(s): VANCOTROUGH, VANCOPEAK, VANCORANDOM, GENTTROUGH, GENTPEAK, GENTRANDOM, TOBRATROUGH, TOBRAPEAK, TOBRARND, AMIKACINPEAK, AMIKACINTROU, AMIKACIN in the last 72 hours.   Microbiology: Recent Results (from the past 720 hour(s))  Urine culture     Status: None   Collection Time: 03/20/15 10:55 PM  Result Value Ref Range Status   Specimen Description URINE, CLEAN CATCH  Final   Special Requests NONE  Final   Colony Count   Final    >=100,000 COLONIES/ML Performed at [facility]    Culture   Final    ESCHERICHIA COLI Note: Confirmed Extended Spectrum Beta-Lactamase Producer (ESBL) CRITICAL RESULT CALLED TO, READ BACK BY AND VERIFIED WITH: JUANITA FUTRELL @ 7:59 AM 03/24/15 BY DWEEKS Performed at [facility]    Report Status 03/24/2015 FINAL  Final   Organism ID, Bacteria ESCHERICHIA COLI  Final      Susceptibility   Escherichia coli - MIC*    AMPICILLIN >=32 RESISTANT Resistant     CEFAZOLIN >=64 RESISTANT Resistant     CEFTRIAXONE RESISTANT      CIPROFLOXACIN >=4 RESISTANT Resistant     GENTAMICIN <=1 SENSITIVE Sensitive     LEVOFLOXACIN >=8 RESISTANT Resistant      NITROFURANTOIN <=16 SENSITIVE Sensitive     TOBRAMYCIN <=1 SENSITIVE Sensitive     TRIMETH/SULFA <=20 SENSITIVE Sensitive     IMIPENEM <=0.25 SENSITIVE Sensitive     PIP/TAZO <=4 SENSITIVE Sensitive     * ESCHERICHIA COLI  Blood culture (routine x 2)     Status: None   Collection Time: 03/20/15 10:57 PM  Result Value Ref Range Status   Specimen Description BLOOD RIGHT ANTECUBITAL  Final   Special Requests BOTTLES DRAWN AEROBIC ONLY 2CC  Final   Culture   Final    NO GROWTH 5 DAYS Note: Culture results may be compromised due to an inadequate volume of blood received in culture bottles. Performed at [facility]    Report Status 03/27/2015 FINAL  Final  Blood culture (routine x 2)     Status: None   Collection Time: 03/20/15 11:00 PM  Result Value Ref Range Status   Specimen Description BLOOD LEFT HAND  Final   Special Requests BOTTLES DRAWN AEROBIC ONLY 2CC  Final   Culture   Final    NO GROWTH 5 DAYS Note: Culture results may be compromised due to an inadequate volume of blood received in culture bottles. Performed at [facility]    Report Status 03/27/2015 FINAL  Final  MRSA PCR Screening     Status: None   Collection Time: 03/21/15  2:49 AM  Result Value Ref Range Status   MRSA by PCR NEGATIVE NEGATIVE Final  Comment:        The GeneXpert MRSA Assay (FDA approved for NASAL specimens only), is one component of a comprehensive MRSA colonization surveillance program. It is not intended to diagnose MRSA infection nor to guide or monitor treatment for MRSA infections.     Medical History: Past Medical History  Diagnosis Date  . Hypertension   . Hyperlipemia   . Deaf-mutism   . Glaucoma     Dr Eulah Pont  . Diabetes mellitus     Type II, sees Dr. Candie Chroman  . Allergy   . Asthma     sees Dr Hartselle Callas  . Stroke 11-05-10    sees Dr. Pearlean Brownie  . Chronic kidney disease     sees Dr. Camille Bal   . PVD (peripheral vascular disease)     sees  Dr. Hart Rochester   . PAD (peripheral artery disease)     Assessment: 64 y/o F to start broad spectrum anti-biotic for r/o sepsis, possible pulmonary source. Noted h/o ESBL UTI, but MD doesn't think urine is source at this point. WBC WNL. Noted renal dysfunction.   Goal of Therapy:  Vancomycin trough level 15-20 mcg/ml  Plan:  -Vancomycin 1000 mg IV q24h -Zosyn 3.375G IV q8h to be infused over 4 hours -Trend WBC, temp, renal function  -Drug levels as indicated     Abran Duke 04/09/2015,5:55 AM

## 2015-04-09 NOTE — Progress Notes (Signed)
[facility] called with results of positive Blood Culture - gram positive cocci in clusters text page to DR I. Izola Price to make aware of results

## 2015-04-10 ENCOUNTER — Inpatient Hospital Stay (HOSPITAL_COMMUNITY): Payer: Medicare Other

## 2015-04-10 DIAGNOSIS — R0600 Dyspnea, unspecified: Secondary | ICD-10-CM

## 2015-04-10 LAB — CBC
HCT: 33.6 % — ABNORMAL LOW (ref 36.0–46.0)
Hemoglobin: 10.8 g/dL — ABNORMAL LOW (ref 12.0–15.0)
MCH: 26 pg (ref 26.0–34.0)
MCHC: 32.1 g/dL (ref 30.0–36.0)
MCV: 80.8 fL (ref 78.0–100.0)
Platelets: 229 10*3/uL (ref 150–400)
RBC: 4.16 MIL/uL (ref 3.87–5.11)
RDW: 16.6 % — ABNORMAL HIGH (ref 11.5–15.5)
WBC: 7.8 10*3/uL (ref 4.0–10.5)

## 2015-04-10 LAB — GLUCOSE, CAPILLARY
GLUCOSE-CAPILLARY: 222 mg/dL — AB (ref 65–99)
GLUCOSE-CAPILLARY: 289 mg/dL — AB (ref 65–99)
GLUCOSE-CAPILLARY: 258 mg/dL — AB (ref 65–99)
Glucose-Capillary: 194 mg/dL — ABNORMAL HIGH (ref 65–99)
Glucose-Capillary: 270 mg/dL — ABNORMAL HIGH (ref 65–99)

## 2015-04-10 LAB — URINE CULTURE

## 2015-04-10 LAB — BASIC METABOLIC PANEL
ANION GAP: 9 (ref 5–15)
BUN: 37 mg/dL — ABNORMAL HIGH (ref 6–20)
CALCIUM: 8.9 mg/dL (ref 8.9–10.3)
CO2: 31 mmol/L (ref 22–32)
Chloride: 105 mmol/L (ref 101–111)
Creatinine, Ser: 1.58 mg/dL — ABNORMAL HIGH (ref 0.44–1.00)
GFR calc Af Amer: 39 mL/min — ABNORMAL LOW (ref >60)
GFR, EST NON AFRICAN AMERICAN: 34 mL/min — AB (ref >60)
GLUCOSE: 207 mg/dL — AB (ref 65–99)
Potassium: 4.1 mmol/L (ref 3.5–5.1)
Sodium: 145 mmol/L (ref 135–145)

## 2015-04-10 LAB — LACTIC ACID, PLASMA: Lactic Acid, Venous: 2.1 mmol/L (ref 0.5–2.0)

## 2015-04-10 LAB — STREP PNEUMONIAE URINARY ANTIGEN: Strep Pneumo Urinary Antigen: NEGATIVE

## 2015-04-10 LAB — BRAIN NATRIURETIC PEPTIDE: B NATRIURETIC PEPTIDE 5: 34.4 pg/mL (ref 0.0–100.0)

## 2015-04-10 MED ORDER — SODIUM CHLORIDE 0.9 % IV SOLN: INTRAVENOUS | Status: DC

## 2015-04-10 MED ORDER — IPRATROPIUM-ALBUTEROL 0.5-2.5 (3) MG/3ML IN SOLN
3.0000 mL | Freq: Four times a day (QID) | RESPIRATORY_TRACT | Status: DC
  Administered 2015-04-10 – 2015-04-12 (×9): 3 mL via RESPIRATORY_TRACT
  Filled 2015-04-10 (×9): qty 3

## 2015-04-10 NOTE — Progress Notes (Signed)
PT Cancellation Note  Patient Details Name: Valerie Rodriguez MRN: [id number] DOB: 02/01/1951   Cancelled Treatment:    Reason Eval/Treat Not Completed: Other (comment)   Discussed Ms. Wrobel's case with her RN, Junius Roads; Ms. Lesiak continues with weakness, and at this point writing is difficult for her;  We will hold PT eval until we can work with her interpreter tomorrow in the am;   Thanks,  Van Clines, PT  Acute Rehabilitation Services Pager [phone] Office [phone]    Van Clines [facility] 04/10/2015, 2:29 PM

## 2015-04-10 NOTE — Progress Notes (Signed)
Lab called with Lactic acid results of 2.1. Lactic acid was 3.3. Will continue to monitor.    Alba Destine, RN, BSN

## 2015-04-10 NOTE — Progress Notes (Signed)
  Echocardiogram 2D Echocardiogram has been performed.  Valerie Rodriguez 04/10/2015, 1:03 PM

## 2015-04-10 NOTE — Progress Notes (Signed)
Patient ID: Valerie Rodriguez, female   DOB: 05-04-1951, 64 y.o.   MRN: [id number]  [facility] PROGRESS NOTE  Valerie Rodriguez TMA:[id number] DOB: 1951-04-23 DOA: 04/08/2015 PCP: No PCP Per Patient   Brief narrative:    64 y.o. female with HTN, CVA with residual left sided weakness, DM type II, HLD, PVD, CKD stage II - III, resident of [facility], brought to [facility] ED for evaluation of progressive lethargy, nausea, non bloody vomiting, diarrhea. In ED, pt became hypoxic with oxygen saturation in 70's requiring placement on BiPAP. CXR worrisome for developing PNA. At base line pt is depended on most of her ADL. TR[facility] asked to admit to SDU.   Assessment/Plan:    Acute hypoxic respiratory failure with hypercarbia  - secondary to HCAP and acute on chronic diastolic CHF - started on vancomycin and zosyn - continue same ABX regimen day #3 - pt looks better and more alert this AM, able to communicate with interpretor for deaf - IS while awake, pul hygiene  Acute on chronic diastolic CHF - last 2 D ECHO in 2014 with grade I diastolic CHF - stop IVF, monitor weight, strict I/O - 2 D ECHO pending  - weight today is 85.3 kg   Sepsis due to HCAP - pt met criteria for sepsis with T 100 F, RR up to 33, lactic acid > 3, source HCAP - vanc and zosyn as noted above, day #3, sepsis etiology resolving  - urine legionella and strep pneumo negative  - follow up on blood culture   Acute encephalopathy  - secondary to problems outlined above, hypernatremia - mental status more clear this AM - will need SLP and PT   Acute kidney failure imposed on CKD stage II - III based on GFR - review of records indicate baseline Cr as high as 1.4 in the past year  - secondary to pre renal etiology - IVF have been provided but due to pulm vasc congestion IVF stopped 6/25 - Cr continues trending down, pt tolerating diet well  - repeat BMP in AM  Hypernatremia - Most likely in the setting of poor oral  intake, ongoing use of Lasix, enalapril. - IVF provided and Na is now WNL - will repeat BMP in AM  Hyperkalemia - resolved, repeat BMP in AM  Dysphagia. - SLP requested   Diabetes mellitus type 2, with complication of PVD and CKD - SSI for now only until oral intake improves   Asthma. - At present clinically she does not appear to have any asthma exacerbation.  Morbid obesity  - Body mass index is 41.16 kg/(m^2).   DVT prophylaxis - Heparin SQ  Code Status: Full.  Family Communication:  plan of care discussed with the patient Disposition Plan: Keep in SDU for now, possible transfer to tele bed in the afternoon   IV access:  Peripheral IV  Procedures and diagnostic studies:    Dg Chest 2 View 04/08/2015 Cardiomegaly and moderate pulmonary vascular congestion suggesting congestive heart failure. 2. Bibasilar airspace disease likely reflects acute atelectasis. Early infection is also considered.   Ct Head Wo Contrast 04/09/2015  Chronic atrophic and ischemic changes. No acute intracranial abnormality is noted.    Nm Pulmonary Perfusion 04/09/2015  No perfusion defects identified to suggest acute pulmonary embolus. 2. Low lung volumes.   Medical Consultants:  None   Other Consultants:  None  IAnti-Infectives:   Vancomycin 6/24 --> Zosyn 6/24 -->   MAGICK-Taylen Osorto, MD  [facility] Pager  [phone]  If 7PM-7AM, please contact night-coverage [email or web address] Password [email or web address] 04/10/2015, 12:52 PM   LOS: 2 days   HPI/Subjective: No events overnight.   Objective: Filed Vitals:   04/10/15 0400 04/10/15 0420 04/10/15 0728 04/10/15 0851  BP: 173/63   144/57  Pulse: 64   63  Temp:    97.6 F (36.4 C)  TempSrc:    Oral  Resp: 9   20  Weight:  86.3 kg (190 lb 4.1 oz)    SpO2: 97%  100% 97%    Intake/Output Summary (Last 24 hours) at 04/10/15 1252 Last data filed at 04/10/15 2725  Gross per 24 hour  Intake   1570 ml  Output      0 ml  Net   1570 ml     Exam:   General:  Pt is alert, follows simple commands appropriately, not in acute distress, deaf  Cardiovascular: Regular rate and rhythm, S1/S2, no murmurs, no rubs, no gallops  Respiratory: Clear to auscultation bilaterally, no wheezing, mild crackles and rhonchi at bases   Abdomen: Soft, non tender, non distended, bowel sounds present, no guarding   Data Reviewed: Basic Metabolic Panel:  Recent Labs Lab 04/08/15 1558 04/08/15 2310 04/09/15 0220 04/10/15 0237  NA 150* 146* 148* 145  K 4.9 5.4* 5.1 4.1  CL 109 105 107 105  CO2 32 29 31 31   GLUCOSE 194* 389* 365* 207*  BUN 40* 44* 44* 37*  CREATININE 2.05* 2.20* 2.06* 1.58*  CALCIUM 9.8 9.4 9.5 8.9   Liver Function Tests:  Recent Labs Lab 04/08/15 1558 04/08/15 2310 04/09/15 0220  AST 33 41 37  ALT 15 16 16   ALKPHOS 62 57 57  BILITOT 0.5 0.6 0.4  PROT 7.4 6.9 6.9  ALBUMIN 3.2* 3.1* 3.0*   CBC:  Recent Labs Lab 04/08/15 1558 04/08/15 2310 04/09/15 0220 04/10/15 0237  WBC 8.0 7.8 7.4 7.8  NEUTROABS 4.7 5.4 5.0  --   HGB 12.1 11.3* 11.6* 10.8*  HCT 37.6 35.2* 36.9 33.6*  MCV 80.5 80.9 81.5 80.8  PLT 266 240 240 229   Cardiac Enzymes:  Recent Labs Lab 04/08/15 2310  TROPONINI <0.03   BNP: Invalid input(s): POCBNP CBG:  Recent Labs Lab 04/09/15 1234 04/09/15 1710 04/10/15 0049 04/10/15 0531 04/10/15 1159  GLUCAP 206* 260* 194* 222* 289*    Recent Results (from the past 240 hour(s))  Blood culture (routine x 2)     Status: None (Preliminary result)   Collection Time: 04/08/15  4:00 PM  Result Value Ref Range Status   Specimen Description BLOOD LEFT HAND  Final   Special Requests BOTTLES DRAWN AEROBIC AND ANAEROBIC 5CC  Final   Culture  Setup Time   Final    GRAM POSITIVE COCCI IN CLUSTERS ANAEROBIC BOTTLE ONLY CRITICAL RESULT CALLED TO, READ BACK BY AND VERIFIED WITH: C. MILLER,RN AT [id number] BY S. YARBROUGH AEROBIC BOTTLE ONLY GRAM POSITIVE COCCI IN PAIRS IN CLUSTERS     Culture   Final    STAPHYLOCOCCUS SPECIES (COAGULASE NEGATIVE) THE SIGNIFICANCE OF ISOLATING THIS ORGANISM FROM A SINGLE SET OF BLOOD CULTURES WHEN MULTIPLE SETS ARE DRAWN IS UNCERTAIN. PLEASE NOTIFY THE MICROBIOLOGY DEPARTMENT WITHIN ONE WEEK IF SPECIATION AND SENSITIVITIES ARE REQUIRED.    Report Status PENDING  Incomplete  Blood culture (routine x 2)     Status: None (Preliminary result)   Collection Time: 04/08/15  4:05 PM  Result Value Ref Range Status   Specimen Description BLOOD RIGHT HAND  Final  Special Requests BOTTLES DRAWN AEROBIC AND ANAEROBIC 5CC  Final   Culture NO GROWTH 2 DAYS  Final   Report Status PENDING  Incomplete  Urine culture     Status: None   Collection Time: 04/08/15  9:24 PM  Result Value Ref Range Status   Specimen Description URINE, CATHETERIZED  Final   Special Requests NONE  Final   Culture   Final    MULTIPLE SPECIES PRESENT, SUGGEST RECOLLECTION IF CLINICALLY INDICATED   Report Status 04/10/2015 FINAL  Final     Scheduled Meds: . atorvastatin  80 mg Oral QPM  . budesonide-formoterol  2 puff Inhalation BID  . citalopram  15 mg Oral Daily  . clopidogrel  75 mg Oral Daily  . fluticasone  2 spray Each Nare Daily  . heparin  5,000 Units Subcutaneous 3 times per day  . insulin aspart  0-20 Units Subcutaneous 4 times per day  . ipratropium-albuterol  3 mL Nebulization QID  . pantoprazole  40 mg Oral Daily  . piperacillin-tazobactam (ZOSYN)  IV  3.375 g Intravenous 3 times per day  . pregabalin  200 mg Oral BID  . vancomycin  1,000 mg Intravenous Q24H   Continuous Infusions:

## 2015-04-10 NOTE — Progress Notes (Signed)
Pt is resting comfortably on room air at this time and is not in distress, RT will continue to monitor

## 2015-04-11 ENCOUNTER — Encounter (HOSPITAL_COMMUNITY): Payer: Self-pay | Admitting: Surgery

## 2015-04-11 LAB — BASIC METABOLIC PANEL
Anion gap: 7 (ref 5–15)
BUN: 27 mg/dL — ABNORMAL HIGH (ref 6–20)
CO2: 30 mmol/L (ref 22–32)
CREATININE: 1.27 mg/dL — AB (ref 0.44–1.00)
Calcium: 8.9 mg/dL (ref 8.9–10.3)
Chloride: 106 mmol/L (ref 101–111)
GFR calc Af Amer: 51 mL/min — ABNORMAL LOW (ref >60)
GFR, EST NON AFRICAN AMERICAN: 44 mL/min — AB (ref >60)
Glucose, Bld: 231 mg/dL — ABNORMAL HIGH (ref 65–99)
Potassium: 3.6 mmol/L (ref 3.5–5.1)
Sodium: 143 mmol/L (ref 135–145)

## 2015-04-11 LAB — GLUCOSE, CAPILLARY
GLUCOSE-CAPILLARY: 274 mg/dL — AB (ref 65–99)
Glucose-Capillary: 219 mg/dL — ABNORMAL HIGH (ref 65–99)
Glucose-Capillary: 245 mg/dL — ABNORMAL HIGH (ref 65–99)
Glucose-Capillary: 283 mg/dL — ABNORMAL HIGH (ref 65–99)
Glucose-Capillary: 283 mg/dL — ABNORMAL HIGH (ref 65–99)

## 2015-04-11 LAB — CBC
HCT: 32.5 % — ABNORMAL LOW (ref 36.0–46.0)
HEMOGLOBIN: 10.5 g/dL — AB (ref 12.0–15.0)
MCH: 25.9 pg — AB (ref 26.0–34.0)
MCHC: 32.3 g/dL (ref 30.0–36.0)
MCV: 80.2 fL (ref 78.0–100.0)
Platelets: 198 10*3/uL (ref 150–400)
RBC: 4.05 MIL/uL (ref 3.87–5.11)
RDW: 16.5 % — ABNORMAL HIGH (ref 11.5–15.5)
WBC: 5.9 10*3/uL (ref 4.0–10.5)

## 2015-04-11 LAB — LEGIONELLA ANTIGEN, URINE

## 2015-04-11 LAB — HEMOGLOBIN A1C
Hgb A1c MFr Bld: 9.4 % — ABNORMAL HIGH (ref 4.8–5.6)
MEAN PLASMA GLUCOSE: 223 mg/dL

## 2015-04-11 LAB — CULTURE, BLOOD (ROUTINE X 2)

## 2015-04-11 MED ORDER — INSULIN GLARGINE 100 UNIT/ML ~~LOC~~ SOLN
15.0000 [IU] | Freq: Every day | SUBCUTANEOUS | Status: DC
  Administered 2015-04-11: 15 [IU] via SUBCUTANEOUS
  Filled 2015-04-11 (×2): qty 0.15

## 2015-04-11 MED ORDER — FUROSEMIDE 10 MG/ML IJ SOLN
20.0000 mg | Freq: Every day | INTRAMUSCULAR | Status: DC
  Administered 2015-04-11 – 2015-04-12 (×2): 20 mg via INTRAVENOUS
  Filled 2015-04-11 (×2): qty 2

## 2015-04-11 NOTE — Progress Notes (Signed)
Inpatient Diabetes Program Recommendations  AACE/ADA: New Consensus Statement on Inpatient Glycemic Control (2013)  Target Ranges:  Prepandial:   less than 140 mg/dL      Peak postprandial:   less than 180 mg/dL (1-2 hours)      Critically ill patients:  140 - 180 mg/dL  Results for Valerie Rodriguez, Valerie Rodriguez (MRN [id number]) as of 04/11/2015 07:44  Ref. Range 04/10/2015 05:31 04/10/2015 11:59 04/10/2015 18:22 04/10/2015 19:45 04/10/2015 23:57 04/11/2015 05:42  Glucose-Capillary Latest Ref Range: 65-99 mg/dL 409222 (H) 811289 (H) 914270 (H) 258 (H) 219 (H) 245 (H)   Results for Valerie Rodriguez, Valerie Rodriguez (MRN [id number]) as of 04/11/2015 07:44  Ref. Range 01/10/2013 02:57  Hemoglobin A1C Latest Ref Range: <5.7 % 13.6 (H)    Diabetes history: DM2 Outpatient Diabetes medications: Lantus 20 units QHS, Humalog 10 units TID with meals Current orders for Inpatient glycemic control: Novolog 0-20 units Q4H  Inpatient Diabetes Program Recommendations Insulin - Basal: Glucose has ranged from 219-289 mg/dl over the past 24 hours and patient has received a total of Novolog 43 units for correction over the past 24 hours. Please consider ordering Lantus 15 units Q24H starting now. HgbA1C: Please consider ordering an A1C to evaluate glycemic control over the past 2-3 months.  Thanks, Orlando PennerMarie Bazil Dhanani, RN, MSN, CCRN, CDE Diabetes Coordinator Inpatient Diabetes Program [phone] (Team Pager from 8am to 5pm) [phone] ([facility] office) [phone] [facility] office) [phone] [facility] office)

## 2015-04-11 NOTE — Progress Notes (Signed)
PT Cancellation Note  Patient Details Name: Winferd HumphreyShirley R Lindeman MRN: [id number] DOB: 11/01/1950   Cancelled Treatment:    Reason Eval/Treat Not Completed: PT screened, no needs identified, will sign off (pt from [facility] with continued decline over the last year. Total assist at baseline for mobility, ADLs and hoyer lift OOB. Dgtr and pt state no acute need. Will sign off)   Delorse Lekabor, Charice Zuno Beth 04/11/2015, 8:37 AM Delaney MeigsMaija Tabor Carlyn Lemke, PT [phone]

## 2015-04-11 NOTE — Progress Notes (Signed)
Utilization Review Completed.  

## 2015-04-11 NOTE — Progress Notes (Signed)
Initial Nutrition Assessment  DOCUMENTATION CODES:  Morbid obesity  INTERVENTION:  Magic cup TID with meals, each supplement provides 290 kcal and 9 grams of protein  NUTRITION DIAGNOSIS:  Increased nutrient needs related to wound healing as evidenced by estimated needs  GOAL:  Patient will meet greater than or equal to 90% of their needs  MONITOR:  PO intake, Supplement acceptance, Labs, Weight trends, I & O's  REASON FOR ASSESSMENT:  Consult, Low [place]aden Assessment of nutrition requirement/status  ASSESSMENT: 64 y.o. Female with PMH of HTN, deaf-mutism, CVA with residual left-sided weakness, diabetes mellitus type 2,  peripheral vascular disease; presented with generalized weakness.  Pt sleeping upon RD visit.  S/p bedside swallow evaluation 6/25.  PO intake 100% per flowsheet records.  Pt with Stage II sacral wound.  Nutrient needs increased.  RD to add supplements.  Nutrition focused physical exam completed.  No muscle or subcutaneous fat depletion noticed.  Height:  Ht Readings from Last 1 Encounters:  03/21/15 4\' 9"  (1.448 m)    Weight:  Wt Readings from Last 1 Encounters:  04/11/15 194 lb 10.7 oz (88.3 kg)    Ideal Body Weight:  44.5 kg  Wt Readings from Last 10 Encounters:  04/11/15 194 lb 10.7 oz (88.3 kg)  03/26/15 216 lb 7.9 oz (98.2 kg)  03/13/13 154 lb 4 oz (69.967 kg)  01/11/13 95 lb 3.8 oz (43.2 kg)  12/30/12 127 lb (57.607 kg)  09/16/12 147 lb (66.679 kg)  12/21/11 133 lb (60.328 kg)  12/14/11 128 lb (58.06 kg)  07/24/10 183 lb (83.008 kg)  07/12/10 175 lb (79.379 kg)    BMI:  Body mass index is 42.11 kg/(m^2).  Estimated Nutritional Needs:  Kcal:  1800-2000  Protein:  90-100 gm  Fluid:  1.8-2.0 L  Skin:  Wound (see comment) (Stage II to sacrum)  Diet Order:  DIET - DYS 1 Room service appropriate?: Yes; Fluid consistency:: Nectar Thick  EDUCATION NEEDS:  No education needs identified at this time   Intake/Output Summary  (Last 24 hours) at 04/11/15 1344 Last data filed at 04/11/15 1000  Gross per 24 hour  Intake    950 ml  Output      5 ml  Net    945 ml    Last BM:  6/26  Maureen ChattersKatie Yancy Knoble, RD, LDN Pager #: [phone] After-Hours Pager #: [phone]

## 2015-04-11 NOTE — Clinical Social Work Note (Signed)
Clinical Social Work Assessment  Patient Details  Name: Valerie Rodriguez MRN: [id number] Date of Birth: 05/09/1951  Date of referral:  04/11/15               Reason for consult:  Facility Placement                Permission sought to share information with:  Facility Industrial/product designerContact Representative Permission granted to share information::  Yes, Verbal Permission Granted (pt dtr gave permission to speak with facility)  Name::        Agency::  [facility]  Relationship::     Contact Information:     Housing/Transportation Living arrangements for the past 2 months:  Skilled Building surveyorursing Facility Source of Information:  Adult Children Patient Interpreter Needed:  None Criminal Activity/Legal Involvement Pertinent to Current Situation/Hospitalization:  No - Comment as needed Significant Relationships:  Adult Children Lives with:  Facility Resident Do you feel safe going back to the place where you live?  Yes Need for family participation in patient care:  Yes (Comment)  Care giving concerns:  [facility] and has been for 2 years   Office managerocial Worker assessment / plan:  CSW spoke with pt dtr about pt return to SNF  Employment status:  Disabled (Comment on whether or not currently receiving Disability) Insurance information:  Managed Medicare PT Recommendations:  Not assessed at this time Information / Referral to community resources:  Skilled Nursing Facility  Patient/Family's Response to care:  Pt dtr is agreeable to [facility] and has been satisfied with their care over the past 2 years  Patient/Family's Understanding of and Emotional Response to Diagnosis, Current Treatment, and Prognosis: Pt dtr has good understanding of pt condition and has not questions/ concerns at this time  Emotional Assessment Appearance:   (did not assess) Attitude/Demeanor/Rapport:  Unable to Assess Affect (typically observed):  Unable to Assess Orientation:  Oriented to  Self Alcohol / Substance use:  Not Applicable Psych involvement (Current and /or in the community):  No (Comment)  Discharge Needs  Concerns to be addressed:  Discharge Planning Concerns Readmission within the last 30 days:  Yes Current discharge risk:  None Barriers to Discharge:  Continued Medical Work up   Izora RibasHoloman, Ronya Gilcrest M, LCSW 04/11/2015, 12:23 PM

## 2015-04-11 NOTE — Progress Notes (Signed)
Patient ID: LAQUISHA NORTHCRAFT, female   DOB: [birth year], 64 y.o.   MRN: [id number]  [facility] PROGRESS NOTE  VIRDIE PENNING IHK:[id number] DOB: [birth year] DOA: 04/08/2015 PCP: No PCP Per Patient   Brief narrative:    64 y.o. female with HTN, CVA with residual left sided weakness, DM type II, HLD, PVD, CKD stage II - III, resident of [facility], brought to [facility] ED for evaluation of progressive lethargy, nausea, non bloody vomiting, diarrhea. In ED, pt became hypoxic with oxygen saturation in 70's requiring placement on BiPAP. CXR worrisome for developing PNA. At base line pt is depended on most of her ADL. TRH asked to admit to SDU.   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 - IS while awake, pulm hygiene - transfer to telemetry bed   Acute on chronic diastolic CHF - last 2 D ECHO in 2014 with grade I diastolic CHF - stop IVF, monitor weight, strict I/O - 2 D ECHO pending  - start low dose Lasix  - weight 85.3 kg on 6/26 and up to 88 kg this AM  Sepsis due to HCAP - pt met criteria for sepsis with T 100 F, RR up to 33, lactic acid > 3, source HCAP - vanc and zosyn as noted above, day #4, sepsis etiology resolving  - urine legionella and strep pneumo negative  - follow up on blood culture   Acute encephalopathy  - secondary to problems outlined above, hypernatremia - mental status more clear this AM - will need SLP and PT   Acute kidney failure imposed on CKD stage II - III based on GFR - review of records indicate baseline Cr as high as 1.4 in the past year  - secondary to pre renal etiology - IVF have been provided but due to pulm vasc congestion IVF stopped 6/25 - Cr continues trending down, pt tolerating diet well  -  repeat BMP in AM  Hypernatremia - Most likely in the setting of poor oral intake, ongoing use of Lasix, enalapril. - IVF provided and Na is now WNL - will repeat BMP in AM  Hyperkalemia - resolved, repeat BMP in AM  Dysphagia. - SLP requested   Diabetes mellitus type 2, with complication of PVD and CKD - SSI for now only until oral intake improves   Asthma. - At present clinically she does not appear to have any asthma exacerbation.  Morbid obesity  - Body mass index is 42.11 kg/(m^2).   DVT prophylaxis - Heparin SQ  Code Status: Full.  Family Communication:  plan of care discussed with the patient Disposition Plan: Keep in SDU for now, possible transfer to tele bed in the afternoon   IV access:  Peripheral IV  Procedures and diagnostic studies:    Dg Chest 2 View 04/08/2015 Cardiomegaly and moderate pulmonary vascular congestion suggesting congestive heart failure. 2. Bibasilar airspace disease likely reflects acute atelectasis. Early infection is also considered.   Ct Head Wo Contrast 04/09/2015  Chronic atrophic and ischemic changes. No acute intracranial abnormality is noted.    Nm Pulmonary Perfusion 04/09/2015  No perfusion defects identified to suggest acute pulmonary embolus. 2. Low lung volumes.   Medical Consultants:  None   Other Consultants:  None  IAnti-Infectives:   Vancomycin 6/24 --> Zosyn 6/24 -->   Faye Ramsay, MD  [facility] Pager [phone]  If 7PM-7AM, please contact night-coverage [email or web address] Password [email or web address] 04/11/2015, 1:23 PM   LOS: 3 days   HPI/Subjective: No events overnight.   Objective: Filed Vitals:   04/11/15 0944 04/11/15 1000 04/11/15 1109 04/11/15 1200  BP:  136/64 136/64 140/56  Pulse: 69 71 69 75  Temp:    98.2 F (36.8 C)  TempSrc:    Oral  Resp: 16 16 12 18   Weight:      SpO2: 92% 94% 100% 91%    Intake/Output Summary (Last 24 hours) at 04/11/15 1323 Last data filed at 04/11/15 1000  Gross per 24 hour   Intake    950 ml  Output      5 ml  Net    945 ml    Exam:   General:  Pt is alert, follows simple commands appropriately, not in acute distress, deaf  Cardiovascular: Regular rate and rhythm, S1/S2, no murmurs, no rubs, no gallops  Respiratory: Clear to auscultation bilaterally, no wheezing, mild crackles and rhonchi at bases   Abdomen: Soft, non tender, non distended, bowel sounds present, no guarding   Data Reviewed: Basic Metabolic Panel:  Recent Labs Lab 04/08/15 1558 04/08/15 2310 04/09/15 0220 04/10/15 0237 04/11/15 0250  NA 150* 146* 148* 145 143  K 4.9 5.4* 5.1 4.1 3.6  CL 109 105 107 105 106  CO2 32 29 31 31 30   GLUCOSE 194* 389* 365* 207* 231*  BUN 40* 44* 44* 37* 27*  CREATININE 2.05* 2.20* 2.06* 1.58* 1.27*  CALCIUM 9.8 9.4 9.5 8.9 8.9   Liver Function Tests:  Recent Labs Lab 04/08/15 1558 04/08/15 2310 04/09/15 0220  AST 33 41 37  ALT 15 16 16   ALKPHOS 62 57 57  BILITOT 0.5 0.6 0.4  PROT 7.4 6.9 6.9  ALBUMIN 3.2* 3.1* 3.0*   CBC:  Recent Labs Lab 04/08/15 1558 04/08/15 2310 04/09/15 0220 04/10/15 0237 04/11/15 0250  WBC 8.0 7.8 7.4 7.8 5.9  NEUTROABS 4.7 5.4 5.0  --   --   HGB 12.1 11.3* 11.6* 10.8* 10.5*  HCT 37.6 35.2* 36.9 33.6* 32.5*  MCV 80.5 80.9 81.5 80.8 80.2  PLT 266 240 240 229 198   Cardiac Enzymes:  Recent Labs Lab 04/08/15 2310  TROPONINI <0.03   BNP: Invalid input(s): POCBNP CBG:  Recent Labs Lab 04/10/15 1822 04/10/15 1945 04/10/15 2357 04/11/15 0542 04/11/15 1207  GLUCAP 270* 258* 219* 245* 283*    Recent Results (from the past 240 hour(s))  Blood culture (routine x 2)     Status: None   Collection Time: 04/08/15  4:00 PM  Result Value Ref Range Status   Specimen Description BLOOD LEFT HAND  Final   Special Requests BOTTLES DRAWN AEROBIC AND ANAEROBIC 5CC  Final   Culture  Setup Time   Final    GRAM POSITIVE COCCI IN CLUSTERS ANAEROBIC BOTTLE ONLY CRITICAL RESULT CALLED TO, READ BACK BY AND  VERIFIED WITH: C. MILLER,RN AT [phone] BY S. YARBROUGH AEROBIC BOTTLE ONLY GRAM POSITIVE COCCI IN PAIRS IN CLUSTERS    Culture   Final    STAPHYLOCOCCUS SPECIES (COAGULASE NEGATIVE) THE SIGNIFICANCE OF ISOLATING THIS ORGANISM FROM A SINGLE SET OF BLOOD CULTURES WHEN MULTIPLE SETS ARE DRAWN IS UNCERTAIN. PLEASE NOTIFY THE MICROBIOLOGY DEPARTMENT WITHIN ONE WEEK IF SPECIATION AND SENSITIVITIES ARE REQUIRED.    Report Status 04/11/2015 FINAL  Final  Blood culture (routine  x 2)     Status: None (Preliminary result)   Collection Time: 04/08/15  4:05 PM  Result Value Ref Range Status   Specimen Description BLOOD RIGHT HAND  Final   Special Requests BOTTLES DRAWN AEROBIC AND ANAEROBIC 5CC  Final   Culture NO GROWTH 2 DAYS  Final   Report Status PENDING  Incomplete  Urine culture     Status: None   Collection Time: 04/08/15  9:24 PM  Result Value Ref Range Status   Specimen Description URINE, CATHETERIZED  Final   Special Requests NONE  Final   Culture   Final    MULTIPLE SPECIES PRESENT, SUGGEST RECOLLECTION IF CLINICALLY INDICATED   Report Status 04/10/2015 FINAL  Final     Scheduled Meds: . atorvastatin  80 mg Oral QPM  . budesonide-formoterol  2 puff Inhalation BID  . citalopram  15 mg Oral Daily  . clopidogrel  75 mg Oral Daily  . fluticasone  2 spray Each Nare Daily  . heparin  5,000 Units Subcutaneous 3 times per day  . insulin aspart  0-20 Units Subcutaneous 4 times per day  . insulin glargine  15 Units Subcutaneous QHS  . ipratropium-albuterol  3 mL Nebulization QID  . pantoprazole  40 mg Oral Daily  . piperacillin-tazobactam (ZOSYN)  IV  3.375 g Intravenous 3 times per day  . pregabalin  200 mg Oral BID  . vancomycin  1,000 mg Intravenous Q24H   Continuous Infusions: . sodium chloride 10 mL/hr at 04/10/15 1940

## 2015-04-11 NOTE — Progress Notes (Signed)
Speech Language Pathology Treatment: Dysphagia  Patient Details Name: Valerie Rodriguez MRN: [id number] DOB: 1951-03-03 Today's Date: 04/11/2015 Time: 4627-0350 SLP Time Calculation (min) (ACUTE ONLY): 18 min  Assessment / Plan / Recommendation Clinical Impression  Pt seen for dysphagia intervention; communication via gestures and writing. Tactile assist during self feeding with nectar juice without s/s aspiration. Multiple swallow x 2 although she denies globus. Afebrile and lung sounds unchanged per RN documentation. ST continue intervention and swallow precautions.   HPI Other Pertinent Information: Valerie Rodriguez is a 64 y.o. female with Past medical history of hypertension, deaf-mutism, CVA with residual left-sided weakness, diabetes mellitus type 2, asthma, dyslipidemia, peripheral vascular disease, and oral pharyngeal dysphagia with history of aspiration PNA.   Pertinent Vitals Pain Assessment: No/denies pain  SLP Plan  Continue with current plan of care    Recommendations Diet recommendations: Dysphagia 1 (puree);Nectar-thick liquid Liquids provided via: Cup;No straw Medication Administration: Whole meds with puree Supervision: Staff to assist with self feeding;Full supervision/cueing for compensatory strategies Compensations: Slow rate;Small sips/bites Postural Changes and/or Swallow Maneuvers: Seated upright 90 degrees              Oral Care Recommendations: Oral care BID Follow up Recommendations:  (TBD) Plan: Continue with current plan of care    GO     Royce Macadamia 04/11/2015, 3:15 PM   Breck Coons Lonell Face.Ed [employer] [phone]

## 2015-04-12 LAB — CBC
HEMATOCRIT: 34.5 % — AB (ref 36.0–46.0)
Hemoglobin: 11.4 g/dL — ABNORMAL LOW (ref 12.0–15.0)
MCH: 26.1 pg (ref 26.0–34.0)
MCHC: 33 g/dL (ref 30.0–36.0)
MCV: 79.1 fL (ref 78.0–100.0)
Platelets: 172 10*3/uL (ref 150–400)
RBC: 4.36 MIL/uL (ref 3.87–5.11)
RDW: 16.6 % — AB (ref 11.5–15.5)
WBC: 6.3 10*3/uL (ref 4.0–10.5)

## 2015-04-12 LAB — BASIC METABOLIC PANEL
Anion gap: 10 (ref 5–15)
BUN: 20 mg/dL (ref 6–20)
CO2: 26 mmol/L (ref 22–32)
Calcium: 8.7 mg/dL — ABNORMAL LOW (ref 8.9–10.3)
Chloride: 102 mmol/L (ref 101–111)
Creatinine, Ser: 1.15 mg/dL — ABNORMAL HIGH (ref 0.44–1.00)
GFR calc Af Amer: 57 mL/min — ABNORMAL LOW (ref >60)
GFR, EST NON AFRICAN AMERICAN: 50 mL/min — AB (ref >60)
Glucose, Bld: 181 mg/dL — ABNORMAL HIGH (ref 65–99)
POTASSIUM: 3.6 mmol/L (ref 3.5–5.1)
SODIUM: 138 mmol/L (ref 135–145)

## 2015-04-12 LAB — GLUCOSE, CAPILLARY
GLUCOSE-CAPILLARY: 252 mg/dL — AB (ref 65–99)
Glucose-Capillary: 194 mg/dL — ABNORMAL HIGH (ref 65–99)
Glucose-Capillary: 287 mg/dL — ABNORMAL HIGH (ref 65–99)

## 2015-04-12 MED ORDER — LEVOFLOXACIN 500 MG PO TABS: 500.0000 mg | ORAL_TABLET | Freq: Every day | ORAL | Status: DC

## 2015-04-12 MED ORDER — DOXYCYCLINE HYCLATE 100 MG PO TABS
100.0000 mg | ORAL_TABLET | Freq: Two times a day (BID) | ORAL | Status: DC
  Administered 2015-04-12: 100 mg via ORAL
  Filled 2015-04-12 (×2): qty 1

## 2015-04-12 MED ORDER — DOXYCYCLINE HYCLATE 100 MG PO TABS: 100.0000 mg | ORAL_TABLET | Freq: Two times a day (BID) | ORAL | Status: DC

## 2015-04-12 MED ORDER — HYDROCODONE-ACETAMINOPHEN 5-325 MG PO TABS: 1.0000 | ORAL_TABLET | Freq: Two times a day (BID) | ORAL | Status: DC

## 2015-04-12 NOTE — Progress Notes (Signed)
ANTIBIOTIC CONSULT NOTE - FOLLOW UP  Pharmacy Consult for Vancomycin/zosyn Indication: pneumonia  No Known Allergies  Patient Measurements: Height: 4\' 9"  (144.8 cm) Weight: 196 lb 3.4 oz (89 kg) IBW/kg (Calculated) : 38.6  Vital Signs: Temp: 97.5 F (36.4 C) (06/28 0557) Temp Source: Oral (06/28 0557) BP: 166/68 mmHg (06/28 0623) Pulse Rate: 62 (06/28 0623) Intake/Output from previous day: 06/27 0701 - 06/28 0700 In: 1080 [P.O.:600; I.V.:130; IV Piggyback:350] Out: 5 [Urine:3; Stool:2] Intake/Output from this shift:    Labs:  Recent Labs  04/10/15 0237 04/11/15 0250 04/12/15 0750  WBC 7.8 5.9 6.3  HGB 10.8* 10.5* 11.4*  PLT 229 198 172  CREATININE 1.58* 1.27*  --    Estimated Creatinine Clearance: 42.1 mL/min (by C-G formula based on Cr of 1.27). No results for input(s): VANCOTROUGH, VANCOPEAK, VANCORANDOM, GENTTROUGH, GENTPEAK, GENTRANDOM, TOBRATROUGH, TOBRAPEAK, TOBRARND, AMIKACINPEAK, AMIKACINTROU, AMIKACIN in the last 72 hours.   Microbiology: Recent Results (from the past 720 hour(s))  Urine culture     Status: None   Collection Time: 03/20/15 10:55 PM  Result Value Ref Range Status   Specimen Description URINE, CLEAN CATCH  Final   Special Requests NONE  Final   Colony Count   Final    >=100,000 COLONIES/ML Performed at [facility]    Culture   Final    ESCHERICHIA COLI Note: Confirmed Extended Spectrum Beta-Lactamase Producer (ESBL) CRITICAL RESULT CALLED TO, READ BACK BY AND VERIFIED WITH: JUANITA FUTRELL @ 7:59 AM 03/24/15 BY DWEEKS Performed at [facility]    Report Status 03/24/2015 FINAL  Final   Organism ID, Bacteria ESCHERICHIA COLI  Final      Susceptibility   Escherichia coli - MIC*    AMPICILLIN >=32 RESISTANT Resistant     CEFAZOLIN >=64 RESISTANT Resistant     CEFTRIAXONE RESISTANT      CIPROFLOXACIN >=4 RESISTANT Resistant     GENTAMICIN <=1 SENSITIVE Sensitive     LEVOFLOXACIN >=8 RESISTANT Resistant    NITROFURANTOIN <=16 SENSITIVE Sensitive     TOBRAMYCIN <=1 SENSITIVE Sensitive     TRIMETH/SULFA <=20 SENSITIVE Sensitive     IMIPENEM <=0.25 SENSITIVE Sensitive     PIP/TAZO <=4 SENSITIVE Sensitive     * ESCHERICHIA COLI  Blood culture (routine x 2)     Status: None   Collection Time: 03/20/15 10:57 PM  Result Value Ref Range Status   Specimen Description BLOOD RIGHT ANTECUBITAL  Final   Special Requests BOTTLES DRAWN AEROBIC ONLY 2CC  Final   Culture   Final    NO GROWTH 5 DAYS Note: Culture results may be compromised due to an inadequate volume of blood received in culture bottles. Performed at [facility]    Report Status 03/27/2015 FINAL  Final  Blood culture (routine x 2)     Status: None   Collection Time: 03/20/15 11:00 PM  Result Value Ref Range Status   Specimen Description BLOOD LEFT HAND  Final   Special Requests BOTTLES DRAWN AEROBIC ONLY 2CC  Final   Culture   Final    NO GROWTH 5 DAYS Note: Culture results may be compromised due to an inadequate volume of blood received in culture bottles. Performed at [facility]    Report Status 03/27/2015 FINAL  Final  MRSA PCR Screening     Status: None   Collection Time: 03/21/15  2:49 AM  Result Value Ref Range Status   MRSA by PCR NEGATIVE NEGATIVE Final    Comment:  The GeneXpert MRSA Assay (FDA approved for NASAL specimens only), is one component of a comprehensive MRSA colonization surveillance program. It is not intended to diagnose MRSA infection nor to guide or monitor treatment for MRSA infections.   Blood culture (routine x 2)     Status: None   Collection Time: 04/08/15  4:00 PM  Result Value Ref Range Status   Specimen Description BLOOD LEFT HAND  Final   Special Requests BOTTLES DRAWN AEROBIC AND ANAEROBIC 5CC  Final   Culture  Setup Time   Final    GRAM POSITIVE COCCI IN CLUSTERS ANAEROBIC BOTTLE ONLY CRITICAL RESULT CALLED TO, READ BACK BY AND VERIFIED WITH: C. MILLER,RN AT  [phone] BY S. YARBROUGH AEROBIC BOTTLE ONLY GRAM POSITIVE COCCI IN PAIRS IN CLUSTERS    Culture   Final    STAPHYLOCOCCUS SPECIES (COAGULASE NEGATIVE) THE SIGNIFICANCE OF ISOLATING THIS ORGANISM FROM A SINGLE SET OF BLOOD CULTURES WHEN MULTIPLE SETS ARE DRAWN IS UNCERTAIN. PLEASE NOTIFY THE MICROBIOLOGY DEPARTMENT WITHIN ONE WEEK IF SPECIATION AND SENSITIVITIES ARE REQUIRED.    Report Status 04/11/2015 FINAL  Final  Blood culture (routine x 2)     Status: None (Preliminary result)   Collection Time: 04/08/15  4:05 PM  Result Value Ref Range Status   Specimen Description BLOOD RIGHT HAND  Final   Special Requests BOTTLES DRAWN AEROBIC AND ANAEROBIC 5CC  Final   Culture NO GROWTH 3 DAYS  Final   Report Status PENDING  Incomplete  Urine culture     Status: None   Collection Time: 04/08/15  9:24 PM  Result Value Ref Range Status   Specimen Description URINE, CATHETERIZED  Final   Special Requests NONE  Final   Culture   Final    MULTIPLE SPECIES PRESENT, SUGGEST RECOLLECTION IF CLINICALLY INDICATED   Report Status 04/10/2015 FINAL  Final    Anti-infectives    Start     Dose/Rate Route Frequency Ordered Stop   04/09/15 0415  vancomycin (VANCOCIN) IVPB 1000 mg/200 mL premix     1,000 mg 200 mL/hr over 60 Minutes Intravenous Every 24 hours 04/09/15 0411     04/09/15 0415  piperacillin-tazobactam (ZOSYN) IVPB 3.375 g     3.375 g 12.5 mL/hr over 240 Minutes Intravenous 3 times per day 04/09/15 0411        Assessment: 64 YOF on day #4 Vanc/Zosyn for r/o sepsis.+ PNA/HCAP. LA 3.3>2.1. PCT < 0.10.  Had been on Clinda + Augmentin PTA. Hx ESBL UTI .Now afb, WBC 6.3. CrCl 41   Vanc 6/25>>   Zosyn 6/25>>    6/24 blood x 2 - CNS x 1   6/24 urine -Negative   6/24 Flu screen neg  Goal of Therapy:  Vancomycin trough level 15-20 mcg/ml  Plan:  - Continue Vancomycin 1000 mg IV q24h for now. Might need to adjust dose if Scr down again in am - Will consider checking vancomycin trough  tomorrow or Thursday if conitnues. - Zosyn 3.375G IV q8h to be infused over 4 hours  Bayard Hugger, PharmD, BCPS  Clinical Pharmacist  Pager: [phone]   04/12/2015,8:44 AM

## 2015-04-12 NOTE — Progress Notes (Signed)
Np notified about retaken b/p of 166/68.

## 2015-04-12 NOTE — Discharge Summary (Signed)
Physician Discharge Summary  Valerie Rodriguez DGU:[id number] DOB: 1951/08/21 DOA: 04/08/2015  PCP: No PCP Per Patient  Admit date: 04/08/2015 Discharge date: 04/12/2015  Recommendations for Outpatient Follow-up:  1. Pt will need to follow up with PCP in 2-3 weeks post discharge 2. Please obtain BMP to evaluate electrolytes and kidney function 3. Please also check CBC to evaluate Hg and Hct levels 4. Complete Doxycycline for 3 more days post discharge   Discharge Diagnoses:  Principal Problem:   Acute respiratory failure Active Problems:   DM2 (diabetes mellitus, type 2)   Essential hypertension   GERD   Bilateral lower extremity edema   HCAP (healthcare-associated pneumonia)   Dysphagia   History of CVA with residual deficit   AKI (acute kidney injury)   Hypernatremia   Discharge Condition: Stable  Diet recommendation: Dys I diet   Brief narrative:    64 y.o. female with HTN, CVA with residual left sided weakness, DM type II, HLD, PVD, CKD stage II - III, resident of [facility], brought to [facility] ED for evaluation of progressive lethargy, nausea, non bloody vomiting, diarrhea. In ED, pt became hypoxic with oxygen saturation in 70's requiring placement on BiPAP. CXR worrisome for developing PNA. At base line pt is depended on most of her ADL. TRH asked to admit to SDU.   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 - IS while awake, pulm hygiene  Acute on chronic diastolic CHF - last 2 D ECHO in 2014 with grade I diastolic CHF - resumed lasix  - weight 85.3 kg on 6/26 and up to 89 kg this AM  Sepsis due to HCAP - pt met criteria for sepsis with T 100 F, RR up to 33, lactic acid > 3, source HCAP - vanc and zosyn as  noted above, day #5, sepsis etiology resolved - urine legionella and strep pneumo negative  - 1/2 blood culture with coag neg staph which is contaminant - ABX transitioned to doxycycline upon discharge to complete therapy for 3 more days  Acute encephalopathy  - secondary to problems outlined above, hypernatremia - mental status at baseline this AM  Acute kidney failure imposed on CKD stage II - III based on GFR - review of records indicate baseline Cr as high as 1.4 in the past year  - secondary to pre renal etiology - IVF have been provided but due to pulm vasc congestion IVF stopped 6/25 - Cr continues trending down, pt tolerating diet well   Hypernatremia - Most likely in the setting of poor oral intake, ongoing use of Lasix, enalapril. - IVF provided and Na is now WNL  Hyperkalemia - resolved  Dysphagia. - pt tolerating diet well   Diabetes mellitus type 2, with complication of PVD and CKD - continue home medical regimen   Asthma. - At present clinically she does not appear to have any asthma exacerbation.  Morbid obesity  - Body mass index is 42.11 kg/(m^2).   Code Status: Full.  Family Communication: plan of care discussed with the patient and daughter at bedside  Disposition Plan:  SNF  IV access:  Peripheral IV  Procedures and diagnostic studies:   Dg Chest 2 View 04/08/2015 Cardiomegaly and moderate pulmonary vascular congestion  suggesting congestive heart failure. 2. Bibasilar airspace disease likely reflects acute atelectasis. Early infection is also considered.   Ct Head Wo Contrast 04/09/2015 Chronic atrophic and ischemic changes. No acute intracranial abnormality is noted.   Nm Pulmonary Perfusion 04/09/2015 No perfusion defects identified to suggest acute pulmonary embolus. 2. Low lung volumes.   Medical Consultants:  None   Other Consultants:  None  IAnti-Infectives:   Vancomycin 6/24 --> 6/28 Zosyn 6/24 -->  6/29 Doxycycline upon discharge for 3 more days         Discharge Exam: Filed Vitals:   04/12/15 0623  BP: 166/68  Pulse: 62  Temp:   Resp:    Filed Vitals:   04/12/15 0341 04/12/15 0557 04/12/15 0623 04/12/15 0853  BP:  163/74 166/68   Pulse:  63 62   Temp:  97.5 F (36.4 C)    TempSrc:  Oral    Resp:  16    Height:      Weight: 89 kg (196 lb 3.4 oz)     SpO2:  97%  96%    General: Pt is alert, follows commands appropriately, not in acute distress Cardiovascular: Regular rate and rhythm, S1/S2 +, no murmurs, no rubs, no gallops Respiratory: Clear to auscultation bilaterally, no wheezing, no crackles, no rhonchi Abdominal: Soft, non tender, non distended, bowel sounds +, no guarding  Discharge Instructions  Discharge Instructions    Diet - low sodium heart healthy    Complete by:  As directed      Increase activity slowly    Complete by:  As directed             Medication List    STOP taking these medications        amoxicillin-clavulanate 600-42.9 MG/5ML suspension  Commonly known as:  AUGMENTIN     clindamycin 300 MG capsule  Commonly known as:  CLEOCIN      TAKE these medications        albuterol 108 (90 BASE) MCG/ACT inhaler  Commonly known as:  PROVENTIL HFA;VENTOLIN HFA  Inhale 2 puffs into the lungs every 6 (six) hours as needed for wheezing or shortness of breath.     ipratropium-albuterol 0.5-2.5 (3) MG/3ML Soln  Commonly known as:  DUONEB  Take 3 mLs by nebulization every 4 (four) hours.     albuterol-ipratropium 18-103 MCG/ACT inhaler  Commonly known as:  COMBIVENT  Inhale 2 puffs into the lungs 4 (four) times daily.     atenolol 50 MG tablet  Commonly known as:  TENORMIN  Take 1 tablet (50 mg total) by mouth daily.     atorvastatin 80 MG tablet  Commonly known as:  LIPITOR  Take 1 tablet (80 mg total) by mouth daily.     azelastine 0.1 % nasal spray  Commonly known as:  ASTELIN  Place 1 spray into both nostrils 2 (two) times  daily. Use in each nostril as directed     budesonide-formoterol 160-4.5 MCG/ACT inhaler  Commonly known as:  SYMBICORT  Inhale 2 puffs into the lungs 2 (two) times daily.     citalopram 10 MG tablet  Commonly known as:  CELEXA  Take 15 mg by mouth daily.     clopidogrel 75 MG tablet  Commonly known as:  PLAVIX  Take 1 tablet (75 mg total) by mouth daily.     docusate sodium 100 MG capsule  Commonly known as:  COLACE  Take 100 mg by mouth every morning.  doxycycline 100 MG tablet  Commonly known as:  VIBRA-TABS  Take 1 tablet (100 mg total) by mouth every 12 (twelve) hours.     enalapril 2.5 MG tablet  Commonly known as:  VASOTEC  Take 2.5 mg by mouth every morning.     ferrous sulfate 325 (65 FE) MG tablet  Take 325 mg by mouth every morning.     fluticasone 50 MCG/ACT nasal spray  Commonly known as:  FLONASE  Place 2 sprays into both nostrils daily.     Fluticasone Furoate-Vilanterol 100-25 MCG/INH Aepb  Inhale 1 puff into the lungs every morning.     furosemide 20 MG tablet  Commonly known as:  LASIX  Take 1 tablet (20 mg total) by mouth daily.     hydrALAZINE 50 MG tablet  Commonly known as:  APRESOLINE  Take 1 tablet (50 mg total) by mouth 2 (two) times daily.     HYDROcodone-acetaminophen 5-325 MG per tablet  Commonly known as:  NORCO/VICODIN  Take 1 tablet by mouth 2 (two) times daily.     insulin glargine 100 UNIT/ML injection  Commonly known as:  LANTUS  Inject 0.2 mLs (20 Units total) into the skin at bedtime.     insulin lispro 100 UNIT/ML KiwkPen  Commonly known as:  HUMALOG  Inject 10 Units into the skin 3 (three) times daily before meals.     latanoprost 0.005 % ophthalmic solution  Commonly known as:  XALATAN  Place 1 drop into both eyes at bedtime.     montelukast 10 MG tablet  Commonly known as:  SINGULAIR  Take 1 tablet (10 mg total) by mouth at bedtime.     pantoprazole 40 MG tablet  Commonly known as:  PROTONIX  Take 1 tablet  (40 mg total) by mouth daily.     polyethylene glycol packet  Commonly known as:  MIRALAX / GLYCOLAX  Take 17 g by mouth every morning.     pregabalin 200 MG capsule  Commonly known as:  LYRICA  Take 1 capsule (200 mg total) by mouth 2 (two) times daily.     tuberculin 5 UNIT/0.1ML injection  Inject 0.1 mLs into the skin See admin instructions. Every 10 days     Vitamin D (Ergocalciferol) 50000 UNITS Caps capsule  Commonly known as:  DRISDOL  Take 50,000 Units by mouth every 30 (thirty) days.             Follow-up Information    Call Faye Ramsay, MD.   Specialty:  Internal Medicine   Why:  As needed call my cell phone [phone]   Contact information:   [address] [place] [address] [phone]        The results of significant diagnostics from this hospitalization (including imaging, microbiology, ancillary and laboratory) are listed below for reference.     Microbiology: Recent Results (from the past 240 hour(s))  Blood culture (routine x 2)     Status: None   Collection Time: 04/08/15  4:00 PM  Result Value Ref Range Status   Specimen Description BLOOD LEFT HAND  Final   Special Requests BOTTLES DRAWN AEROBIC AND ANAEROBIC 5CC  Final   Culture  Setup Time   Final    GRAM POSITIVE COCCI IN CLUSTERS ANAEROBIC BOTTLE ONLY CRITICAL RESULT CALLED TO, READ BACK BY AND VERIFIED WITH: C. MILLER,RN AT [phone] BY S. YARBROUGH AEROBIC BOTTLE ONLY GRAM POSITIVE COCCI IN PAIRS IN CLUSTERS    Culture  Final    STAPHYLOCOCCUS SPECIES (COAGULASE NEGATIVE) THE SIGNIFICANCE OF ISOLATING THIS ORGANISM FROM A SINGLE SET OF BLOOD CULTURES WHEN MULTIPLE SETS ARE DRAWN IS UNCERTAIN. PLEASE NOTIFY THE MICROBIOLOGY DEPARTMENT WITHIN ONE WEEK IF SPECIATION AND SENSITIVITIES ARE REQUIRED.    Report Status 04/11/2015 FINAL  Final  Blood culture (routine x 2)     Status: None (Preliminary result)   Collection Time: 04/08/15  4:05 PM  Result  Value Ref Range Status   Specimen Description BLOOD RIGHT HAND  Final   Special Requests BOTTLES DRAWN AEROBIC AND ANAEROBIC 5CC  Final   Culture NO GROWTH 3 DAYS  Final   Report Status PENDING  Incomplete  Urine culture     Status: None   Collection Time: 04/08/15  9:24 PM  Result Value Ref Range Status   Specimen Description URINE, CATHETERIZED  Final   Special Requests NONE  Final   Culture   Final    MULTIPLE SPECIES PRESENT, SUGGEST RECOLLECTION IF CLINICALLY INDICATED   Report Status 04/10/2015 FINAL  Final     Labs: Basic Metabolic Panel:  Recent Labs Lab 04/08/15 2310 04/09/15 0220 04/10/15 0237 04/11/15 0250 04/12/15 0750  NA 146* 148* 145 143 138  K 5.4* 5.1 4.1 3.6 3.6  CL 105 107 105 106 102  CO2 29 31 31 30 26   GLUCOSE 389* 365* 207* 231* 181*  BUN 44* 44* 37* 27* 20  CREATININE 2.20* 2.06* 1.58* 1.27* 1.15*  CALCIUM 9.4 9.5 8.9 8.9 8.7*   Liver Function Tests:  Recent Labs Lab 04/08/15 1558 04/08/15 2310 04/09/15 0220  AST 33 41 37  ALT 15 16 16   ALKPHOS 62 57 57  BILITOT 0.5 0.6 0.4  PROT 7.4 6.9 6.9  ALBUMIN 3.2* 3.1* 3.0*   No results for input(s): LIPASE, AMYLASE in the last 168 hours. No results for input(s): AMMONIA in the last 168 hours. CBC:  Recent Labs Lab 04/08/15 1558 04/08/15 2310 04/09/15 0220 04/10/15 0237 04/11/15 0250 04/12/15 0750  WBC 8.0 7.8 7.4 7.8 5.9 6.3  NEUTROABS 4.7 5.4 5.0  --   --   --   HGB 12.1 11.3* 11.6* 10.8* 10.5* 11.4*  HCT 37.6 35.2* 36.9 33.6* 32.5* 34.5*  MCV 80.5 80.9 81.5 80.8 80.2 79.1  PLT 266 240 240 229 198 172   Cardiac Enzymes:  Recent Labs Lab 04/08/15 2310  TROPONINI <0.03   BNP: BNP (last 3 results)  Recent Labs  04/08/15 1558 04/10/15 0237  BNP 27.2 34.4   CBG:  Recent Labs Lab 04/11/15 1207 04/11/15 1838 04/11/15 2123 04/12/15 0029 04/12/15 0542  GLUCAP 283* 283* 274* 287* 194*     SIGNED: Time coordinating discharge: 30 minutes  MAGICK-Ra Pfiester,  MD  [facility] 04/12/2015, 10:06 AM Pager [phone]  If 7PM-7AM, please contact night-coverage [email or web address] Password [email or web address]

## 2015-04-12 NOTE — Progress Notes (Signed)
NP Craige CottaKirby paged concerning patient's b/p after recheck. Patient is alert and awake. Awaiting any further orders

## 2015-04-12 NOTE — Care Management (Signed)
Important Message  Patient Details  Name: Valerie Rodriguez MRN: [id number] Date of Birth: 10/19/1950   Medicare Important Message Given:  Yes-second notification given    Magdalene RiverMayo, Bernetha Anschutz T, RN 04/12/2015, 7:29 AM

## 2015-04-12 NOTE — Care Management Note (Signed)
Case Management Note  Patient Details  Name: Winferd HumphreyShirley R Polan MRN: [id number] Date of Birth: 01/23/1951  Subjective/Objective:        [facility]  CSW following.            Action/Plan:   Expected Discharge Date:                  Expected Discharge Plan:  Skilled Nursing Facility  In-House Referral:  Clinical Social Work  Discharge planning Services  CM Consult  Post Acute Care Choice:    Choice offered to:     DME Arranged:    DME Agency:     HH Arranged:    HH Agency:     Status of Service:  Completed, signed off  Medicare Important Message Given:  Yes-second notification given Date Medicare IM Given:    Medicare IM give by:    Date Additional Medicare IM Given:    Additional Medicare Important Message give by:     If discussed at Long Length of Stay Meetings, dates discussed:    Additional Comments:  Leone Havenaylor, Eliana Lueth Clinton, RN 04/12/2015, 11:27 AM

## 2015-04-12 NOTE — Discharge Instructions (Signed)

## 2015-04-12 NOTE — Progress Notes (Signed)
NP Craige CottaKirby notified concerning patient's b/p this am. Will await further orders. Will also recheck within 45 mins.

## 2015-04-12 NOTE — Clinical Social Work Note (Signed)
Per MD [facility]. RN, patient/family [name](Bonnie), and facility notified of patient's DC. RN given number for report. DC packet on patient's chart. Ambulance transport requested for patient. CSW signing off at this time.   Roddie McBryant Chayim Bialas MSW, De[place]ordovaLCSWA, [place], [phone]

## 2015-04-13 LAB — CULTURE, BLOOD (ROUTINE X 2): Culture: NO GROWTH

## 2015-05-07 ENCOUNTER — Inpatient Hospital Stay (HOSPITAL_COMMUNITY): Payer: Medicare Other

## 2015-05-07 ENCOUNTER — Encounter (HOSPITAL_COMMUNITY): Payer: Self-pay | Admitting: Emergency Medicine

## 2015-05-07 ENCOUNTER — Emergency Department (HOSPITAL_COMMUNITY): Payer: Medicare Other

## 2015-05-07 ENCOUNTER — Inpatient Hospital Stay (HOSPITAL_COMMUNITY)
Admission: EM | Admit: 2015-05-07 | Discharge: 2015-05-22 | DRG: 871 | Disposition: A | Discharge status: Skilled Nursing Facility | Payer: Medicare Other | Attending: Internal Medicine | Admitting: Internal Medicine

## 2015-05-07 DIAGNOSIS — I6930 Unspecified sequelae of cerebral infarction: Secondary | ICD-10-CM | POA: Diagnosis not present

## 2015-05-07 DIAGNOSIS — Z66 Do not resuscitate: Secondary | ICD-10-CM

## 2015-05-07 DIAGNOSIS — Z87891 Personal history of nicotine dependence: Secondary | ICD-10-CM

## 2015-05-07 DIAGNOSIS — Y95 Nosocomial condition: Secondary | ICD-10-CM | POA: Diagnosis present

## 2015-05-07 DIAGNOSIS — E785 Hyperlipidemia, unspecified: Secondary | ICD-10-CM | POA: Diagnosis present

## 2015-05-07 DIAGNOSIS — J9600 Acute respiratory failure, unspecified whether with hypoxia or hypercapnia: Secondary | ICD-10-CM

## 2015-05-07 DIAGNOSIS — E871 Hypo-osmolality and hyponatremia: Secondary | ICD-10-CM | POA: Diagnosis present

## 2015-05-07 DIAGNOSIS — E46 Unspecified protein-calorie malnutrition: Secondary | ICD-10-CM | POA: Insufficient documentation

## 2015-05-07 DIAGNOSIS — E861 Hypovolemia: Secondary | ICD-10-CM | POA: Diagnosis present

## 2015-05-07 DIAGNOSIS — Z7902 Long term (current) use of antithrombotics/antiplatelets: Secondary | ICD-10-CM

## 2015-05-07 DIAGNOSIS — E1122 Type 2 diabetes mellitus with diabetic chronic kidney disease: Secondary | ICD-10-CM | POA: Diagnosis present

## 2015-05-07 DIAGNOSIS — Z794 Long term (current) use of insulin: Secondary | ICD-10-CM

## 2015-05-07 DIAGNOSIS — Z515 Encounter for palliative care: Secondary | ICD-10-CM

## 2015-05-07 DIAGNOSIS — L89613 Pressure ulcer of right heel, stage 3: Secondary | ICD-10-CM | POA: Diagnosis present

## 2015-05-07 DIAGNOSIS — R401 Stupor: Secondary | ICD-10-CM | POA: Diagnosis not present

## 2015-05-07 DIAGNOSIS — N189 Chronic kidney disease, unspecified: Secondary | ICD-10-CM | POA: Diagnosis present

## 2015-05-07 DIAGNOSIS — N179 Acute kidney failure, unspecified: Secondary | ICD-10-CM | POA: Diagnosis present

## 2015-05-07 DIAGNOSIS — I959 Hypotension, unspecified: Secondary | ICD-10-CM | POA: Diagnosis present

## 2015-05-07 DIAGNOSIS — E878 Other disorders of electrolyte and fluid balance, not elsewhere classified: Secondary | ICD-10-CM | POA: Diagnosis not present

## 2015-05-07 DIAGNOSIS — R748 Abnormal levels of other serum enzymes: Secondary | ICD-10-CM | POA: Diagnosis present

## 2015-05-07 DIAGNOSIS — Z79899 Other long term (current) drug therapy: Secondary | ICD-10-CM | POA: Diagnosis not present

## 2015-05-07 DIAGNOSIS — R1310 Dysphagia, unspecified: Secondary | ICD-10-CM

## 2015-05-07 DIAGNOSIS — H540 Blindness, both eyes: Secondary | ICD-10-CM | POA: Diagnosis present

## 2015-05-07 DIAGNOSIS — E119 Type 2 diabetes mellitus without complications: Secondary | ICD-10-CM

## 2015-05-07 DIAGNOSIS — J9621 Acute and chronic respiratory failure with hypoxia: Secondary | ICD-10-CM | POA: Diagnosis present

## 2015-05-07 DIAGNOSIS — E1151 Type 2 diabetes mellitus with diabetic peripheral angiopathy without gangrene: Secondary | ICD-10-CM | POA: Diagnosis present

## 2015-05-07 DIAGNOSIS — J9601 Acute respiratory failure with hypoxia: Secondary | ICD-10-CM

## 2015-05-07 DIAGNOSIS — G9341 Metabolic encephalopathy: Secondary | ICD-10-CM | POA: Diagnosis present

## 2015-05-07 DIAGNOSIS — J9622 Acute and chronic respiratory failure with hypercapnia: Secondary | ICD-10-CM | POA: Diagnosis present

## 2015-05-07 DIAGNOSIS — L89622 Pressure ulcer of left heel, stage 2: Secondary | ICD-10-CM | POA: Diagnosis present

## 2015-05-07 DIAGNOSIS — J189 Pneumonia, unspecified organism: Secondary | ICD-10-CM | POA: Diagnosis not present

## 2015-05-07 DIAGNOSIS — Z7951 Long term (current) use of inhaled steroids: Secondary | ICD-10-CM

## 2015-05-07 DIAGNOSIS — J690 Pneumonitis due to inhalation of food and vomit: Secondary | ICD-10-CM | POA: Diagnosis present

## 2015-05-07 DIAGNOSIS — R4020 Unspecified coma: Secondary | ICD-10-CM | POA: Diagnosis not present

## 2015-05-07 DIAGNOSIS — A419 Sepsis, unspecified organism: Secondary | ICD-10-CM | POA: Diagnosis present

## 2015-05-07 DIAGNOSIS — L89150 Pressure ulcer of sacral region, unstageable: Secondary | ICD-10-CM | POA: Diagnosis present

## 2015-05-07 DIAGNOSIS — R739 Hyperglycemia, unspecified: Secondary | ICD-10-CM

## 2015-05-07 DIAGNOSIS — H913 Deaf nonspeaking, not elsewhere classified: Secondary | ICD-10-CM | POA: Diagnosis present

## 2015-05-07 DIAGNOSIS — I509 Heart failure, unspecified: Secondary | ICD-10-CM | POA: Diagnosis present

## 2015-05-07 DIAGNOSIS — N19 Unspecified kidney failure: Secondary | ICD-10-CM

## 2015-05-07 DIAGNOSIS — Z6837 Body mass index (BMI) 37.0-37.9, adult: Secondary | ICD-10-CM

## 2015-05-07 DIAGNOSIS — I129 Hypertensive chronic kidney disease with stage 1 through stage 4 chronic kidney disease, or unspecified chronic kidney disease: Secondary | ICD-10-CM | POA: Diagnosis present

## 2015-05-07 DIAGNOSIS — E860 Dehydration: Secondary | ICD-10-CM | POA: Diagnosis present

## 2015-05-07 DIAGNOSIS — E876 Hypokalemia: Secondary | ICD-10-CM | POA: Diagnosis not present

## 2015-05-07 DIAGNOSIS — E870 Hyperosmolality and hypernatremia: Secondary | ICD-10-CM | POA: Diagnosis present

## 2015-05-07 DIAGNOSIS — J45909 Unspecified asthma, uncomplicated: Secondary | ICD-10-CM | POA: Diagnosis present

## 2015-05-07 DIAGNOSIS — Z79891 Long term (current) use of opiate analgesic: Secondary | ICD-10-CM | POA: Diagnosis not present

## 2015-05-07 DIAGNOSIS — R4182 Altered mental status, unspecified: Secondary | ICD-10-CM

## 2015-05-07 DIAGNOSIS — R633 Feeding difficulties, unspecified: Secondary | ICD-10-CM

## 2015-05-07 DIAGNOSIS — J441 Chronic obstructive pulmonary disease with (acute) exacerbation: Secondary | ICD-10-CM | POA: Diagnosis present

## 2015-05-07 DIAGNOSIS — R40243 Glasgow coma scale score 3-8: Secondary | ICD-10-CM | POA: Diagnosis not present

## 2015-05-07 DIAGNOSIS — K567 Ileus, unspecified: Secondary | ICD-10-CM

## 2015-05-07 DIAGNOSIS — H409 Unspecified glaucoma: Secondary | ICD-10-CM | POA: Diagnosis present

## 2015-05-07 DIAGNOSIS — Z9911 Dependence on respirator [ventilator] status: Secondary | ICD-10-CM

## 2015-05-07 DIAGNOSIS — Z9889 Other specified postprocedural states: Secondary | ICD-10-CM

## 2015-05-07 DIAGNOSIS — Z9289 Personal history of other medical treatment: Secondary | ICD-10-CM

## 2015-05-07 DIAGNOSIS — E1165 Type 2 diabetes mellitus with hyperglycemia: Secondary | ICD-10-CM | POA: Diagnosis present

## 2015-05-07 LAB — CBC WITH DIFFERENTIAL/PLATELET
Basophils Absolute: 0 10*3/uL (ref 0.0–0.1)
Basophils Relative: 0 % (ref 0–1)
Eosinophils Absolute: 0 10*3/uL (ref 0.0–0.7)
Eosinophils Relative: 0 % (ref 0–5)
HEMATOCRIT: 37.3 % (ref 36.0–46.0)
HEMOGLOBIN: 11.5 g/dL — AB (ref 12.0–15.0)
LYMPHS ABS: 2.1 10*3/uL (ref 0.7–4.0)
Lymphocytes Relative: 21 % (ref 12–46)
MCH: 25.8 pg — ABNORMAL LOW (ref 26.0–34.0)
MCHC: 30.8 g/dL (ref 30.0–36.0)
MCV: 83.6 fL (ref 78.0–100.0)
Monocytes Absolute: 1.1 10*3/uL — ABNORMAL HIGH (ref 0.1–1.0)
Monocytes Relative: 11 % (ref 3–12)
NEUTROS PCT: 68 % (ref 43–77)
Neutro Abs: 6.8 10*3/uL (ref 1.7–7.7)
Platelets: 168 10*3/uL (ref 150–400)
RBC: 4.46 MIL/uL (ref 3.87–5.11)
RDW: 17.6 % — ABNORMAL HIGH (ref 11.5–15.5)
WBC: 10 10*3/uL (ref 4.0–10.5)

## 2015-05-07 LAB — COMPREHENSIVE METABOLIC PANEL
ALK PHOS: 61 U/L (ref 38–126)
ALT: 18 U/L (ref 14–54)
AST: 51 U/L — AB (ref 15–41)
Albumin: 2.6 g/dL — ABNORMAL LOW (ref 3.5–5.0)
Anion gap: 14 (ref 5–15)
BUN: 89 mg/dL — ABNORMAL HIGH (ref 6–20)
CO2: 25 mmol/L (ref 22–32)
Calcium: 8.9 mg/dL (ref 8.9–10.3)
Chloride: 115 mmol/L — ABNORMAL HIGH (ref 101–111)
Creatinine, Ser: 3.28 mg/dL — ABNORMAL HIGH (ref 0.44–1.00)
GFR calc Af Amer: 16 mL/min — ABNORMAL LOW (ref >60)
GFR calc non Af Amer: 14 mL/min — ABNORMAL LOW (ref >60)
GLUCOSE: 388 mg/dL — AB (ref 65–99)
POTASSIUM: 4.8 mmol/L (ref 3.5–5.1)
Sodium: 154 mmol/L — ABNORMAL HIGH (ref 135–145)
TOTAL PROTEIN: 7.2 g/dL (ref 6.5–8.1)
Total Bilirubin: 0.7 mg/dL (ref 0.3–1.2)

## 2015-05-07 LAB — GLUCOSE, CAPILLARY
GLUCOSE-CAPILLARY: 395 mg/dL — AB (ref 65–99)
GLUCOSE-CAPILLARY: 412 mg/dL — AB (ref 65–99)
GLUCOSE-CAPILLARY: 261 mg/dL — AB (ref 65–99)
GLUCOSE-CAPILLARY: 265 mg/dL — AB (ref 65–99)
Glucose-Capillary: 354 mg/dL — ABNORMAL HIGH (ref 65–99)

## 2015-05-07 LAB — I-STAT ARTERIAL BLOOD GAS, ED
Acid-base deficit: 1 mmol/L (ref 0.0–2.0)
Bicarbonate: 24.9 mEq/L — ABNORMAL HIGH (ref 20.0–24.0)
O2 SAT: 100 %
TCO2: 26 mmol/L (ref 0–100)
pCO2 arterial: 46.4 mmHg — ABNORMAL HIGH (ref 35.0–45.0)
pH, Arterial: 7.337 — ABNORMAL LOW (ref 7.350–7.450)
pO2, Arterial: 251 mmHg — ABNORMAL HIGH (ref 80.0–100.0)

## 2015-05-07 LAB — URINALYSIS, ROUTINE W REFLEX MICROSCOPIC
Bilirubin Urine: NEGATIVE
Glucose, UA: 100 mg/dL — AB
Ketones, ur: 15 mg/dL — AB
Nitrite: NEGATIVE
Protein, ur: 100 mg/dL — AB
SPECIFIC GRAVITY, URINE: 1.015 (ref 1.005–1.030)
UROBILINOGEN UA: 0.2 mg/dL (ref 0.0–1.0)
pH: 5.5 (ref 5.0–8.0)

## 2015-05-07 LAB — MRSA PCR SCREENING: MRSA by PCR: NEGATIVE

## 2015-05-07 LAB — CREATININE, SERUM
Creatinine, Ser: 2.99 mg/dL — ABNORMAL HIGH (ref 0.44–1.00)
GFR calc Af Amer: 18 mL/min — ABNORMAL LOW (ref >60)
GFR calc non Af Amer: 16 mL/min — ABNORMAL LOW (ref >60)

## 2015-05-07 LAB — PROTIME-INR
INR: 1.46 (ref 0.00–1.49)
Prothrombin Time: 17.8 seconds — ABNORMAL HIGH (ref 11.6–15.2)

## 2015-05-07 LAB — URINE MICROSCOPIC-ADD ON

## 2015-05-07 LAB — CBG MONITORING, ED: Glucose-Capillary: 356 mg/dL — ABNORMAL HIGH (ref 65–99)

## 2015-05-07 LAB — I-STAT TROPONIN, ED: Troponin i, poc: 0.95 ng/mL (ref 0.00–0.08)

## 2015-05-07 LAB — CBC
HCT: 30.4 % — ABNORMAL LOW (ref 36.0–46.0)
Hemoglobin: 9.4 g/dL — ABNORMAL LOW (ref 12.0–15.0)
MCH: 25.8 pg — AB (ref 26.0–34.0)
MCHC: 30.9 g/dL (ref 30.0–36.0)
MCV: 83.5 fL (ref 78.0–100.0)
Platelets: 131 10*3/uL — ABNORMAL LOW (ref 150–400)
RBC: 3.64 MIL/uL — ABNORMAL LOW (ref 3.87–5.11)
RDW: 17.4 % — ABNORMAL HIGH (ref 11.5–15.5)
WBC: 8.1 10*3/uL (ref 4.0–10.5)

## 2015-05-07 LAB — LIPASE, BLOOD: LIPASE: 29 U/L (ref 22–51)

## 2015-05-07 LAB — PHOSPHORUS: Phosphorus: 4 mg/dL (ref 2.5–4.6)

## 2015-05-07 LAB — I-STAT CG4 LACTIC ACID, ED: Lactic Acid, Venous: 1.84 mmol/L (ref 0.5–2.0)

## 2015-05-07 LAB — TRIGLYCERIDES: TRIGLYCERIDES: 351 mg/dL — AB (ref <150)

## 2015-05-07 LAB — PROCALCITONIN: Procalcitonin: 0.74 ng/mL

## 2015-05-07 LAB — STREP PNEUMONIAE URINARY ANTIGEN: Strep Pneumo Urinary Antigen: NEGATIVE

## 2015-05-07 LAB — MAGNESIUM: MAGNESIUM: 2.2 mg/dL (ref 1.7–2.4)

## 2015-05-07 LAB — LACTIC ACID, PLASMA: Lactic Acid, Venous: 1.8 mmol/L (ref 0.5–2.0)

## 2015-05-07 MED ORDER — CLOPIDOGREL BISULFATE 75 MG PO TABS
75.0000 mg | ORAL_TABLET | Freq: Every day | ORAL | Status: DC
  Administered 2015-05-07 – 2015-05-14 (×7): 75 mg via ORAL
  Filled 2015-05-07 (×8): qty 1

## 2015-05-07 MED ORDER — CITALOPRAM HYDROBROMIDE 20 MG PO TABS
20.0000 mg | ORAL_TABLET | Freq: Every day | ORAL | Status: DC
  Administered 2015-05-07 – 2015-05-21 (×13): 20 mg via ORAL
  Filled 2015-05-07 (×14): qty 1

## 2015-05-07 MED ORDER — VITAL HIGH PROTEIN PO LIQD
1000.0000 mL | ORAL | Status: DC
  Administered 2015-05-07 – 2015-05-11 (×6): 1000 mL
  Filled 2015-05-07 (×6): qty 1000

## 2015-05-07 MED ORDER — FERROUS SULFATE 325 (65 FE) MG PO TABS
325.0000 mg | ORAL_TABLET | ORAL | Status: DC
  Filled 2015-05-07 (×2): qty 1

## 2015-05-07 MED ORDER — DOCUSATE SODIUM 100 MG PO CAPS
100.0000 mg | ORAL_CAPSULE | ORAL | Status: DC
  Filled 2015-05-07 (×2): qty 1

## 2015-05-07 MED ORDER — PIPERACILLIN-TAZOBACTAM IN DEX 2-0.25 GM/50ML IV SOLN
2.2500 g | Freq: Four times a day (QID) | INTRAVENOUS | Status: DC
  Administered 2015-05-07 – 2015-05-08 (×4): 2.25 g via INTRAVENOUS
  Filled 2015-05-07 (×6): qty 50

## 2015-05-07 MED ORDER — SODIUM CHLORIDE 0.9 % IV BOLUS (SEPSIS)
1000.0000 mL | Freq: Once | INTRAVENOUS | Status: AC
  Administered 2015-05-07 (×2): 1000 mL via INTRAVENOUS

## 2015-05-07 MED ORDER — VANCOMYCIN HCL IN DEXTROSE 1-5 GM/200ML-% IV SOLN: 1000.0000 mg | INTRAVENOUS | Status: DC

## 2015-05-07 MED ORDER — VANCOMYCIN HCL IN DEXTROSE 750-5 MG/150ML-% IV SOLN: 750.0000 mg | Freq: Two times a day (BID) | INTRAVENOUS | Status: DC

## 2015-05-07 MED ORDER — POLYETHYLENE GLYCOL 3350 17 G PO PACK
17.0000 g | PACK | ORAL | Status: DC
  Filled 2015-05-07 (×2): qty 1

## 2015-05-07 MED ORDER — PREGABALIN 75 MG PO CAPS
200.0000 mg | ORAL_CAPSULE | Freq: Two times a day (BID) | ORAL | Status: DC
  Administered 2015-05-07 – 2015-05-08 (×2): 200 mg via ORAL
  Filled 2015-05-07: qty 1
  Filled 2015-05-07: qty 2
  Filled 2015-05-07: qty 1
  Filled 2015-05-07: qty 2

## 2015-05-07 MED ORDER — SODIUM CHLORIDE 0.9 % IV BOLUS (SEPSIS)
1000.0000 mL | INTRAVENOUS | Status: AC
  Administered 2015-05-07: 1000 mL via INTRAVENOUS

## 2015-05-07 MED ORDER — HEPARIN SODIUM (PORCINE) 5000 UNIT/ML IJ SOLN
5000.0000 [IU] | Freq: Three times a day (TID) | INTRAMUSCULAR | Status: DC
  Administered 2015-05-07 – 2015-05-19 (×36): 5000 [IU] via SUBCUTANEOUS
  Filled 2015-05-07 (×36): qty 1

## 2015-05-07 MED ORDER — ROCURONIUM BROMIDE 50 MG/5ML IV SOLN
INTRAVENOUS | Status: AC
  Filled 2015-05-07: qty 2

## 2015-05-07 MED ORDER — VANCOMYCIN HCL IN DEXTROSE 1-5 GM/200ML-% IV SOLN
1000.0000 mg | Freq: Once | INTRAVENOUS | Status: AC
  Administered 2015-05-07: 1000 mg via INTRAVENOUS
  Filled 2015-05-07: qty 200

## 2015-05-07 MED ORDER — IPRATROPIUM-ALBUTEROL 0.5-2.5 (3) MG/3ML IN SOLN
3.0000 mL | RESPIRATORY_TRACT | Status: DC
  Administered 2015-05-07 – 2015-05-11 (×25): 3 mL via RESPIRATORY_TRACT
  Filled 2015-05-07 (×25): qty 3

## 2015-05-07 MED ORDER — SODIUM CHLORIDE 0.9 % IV BOLUS (SEPSIS)
1000.0000 mL | Freq: Once | INTRAVENOUS | Status: AC
  Administered 2015-05-07: 1000 mL via INTRAVENOUS

## 2015-05-07 MED ORDER — INSULIN ASPART 100 UNIT/ML ~~LOC~~ SOLN
0.0000 [IU] | Freq: Three times a day (TID) | SUBCUTANEOUS | Status: DC
  Administered 2015-05-07: 16:00:00 via SUBCUTANEOUS
  Administered 2015-05-07 (×2): 20 [IU] via SUBCUTANEOUS

## 2015-05-07 MED ORDER — SACCHAROMYCES BOULARDII 250 MG PO CAPS
250.0000 mg | ORAL_CAPSULE | Freq: Two times a day (BID) | ORAL | Status: DC
  Administered 2015-05-07 – 2015-05-22 (×27): 250 mg via ORAL
  Filled 2015-05-07 (×28): qty 1

## 2015-05-07 MED ORDER — LIDOCAINE HCL (CARDIAC) 20 MG/ML IV SOLN
INTRAVENOUS | Status: AC
  Filled 2015-05-07: qty 5

## 2015-05-07 MED ORDER — INSULIN GLARGINE 100 UNIT/ML ~~LOC~~ SOLN
20.0000 [IU] | Freq: Every day | SUBCUTANEOUS | Status: DC
  Administered 2015-05-07 – 2015-05-08 (×2): 20 [IU] via SUBCUTANEOUS
  Filled 2015-05-07 (×2): qty 0.2

## 2015-05-07 MED ORDER — FAMOTIDINE 20 MG PO TABS
20.0000 mg | ORAL_TABLET | Freq: Every day | ORAL | Status: DC
  Administered 2015-05-07 – 2015-05-11 (×5): 20 mg
  Filled 2015-05-07 (×5): qty 1

## 2015-05-07 MED ORDER — LACTATED RINGERS IV SOLN
INTRAVENOUS | Status: AC
  Administered 2015-05-07: 250 mL/h via INTRAVENOUS

## 2015-05-07 MED ORDER — VANCOMYCIN HCL 500 MG IV SOLR
500.0000 mg | Freq: Once | INTRAVENOUS | Status: AC
  Administered 2015-05-07: 500 mg via INTRAVENOUS
  Filled 2015-05-07: qty 500

## 2015-05-07 MED ORDER — METHYLPREDNISOLONE SODIUM SUCC 40 MG IJ SOLR
40.0000 mg | Freq: Every day | INTRAMUSCULAR | Status: DC
  Administered 2015-05-07 – 2015-05-09 (×3): 40 mg via INTRAVENOUS
  Filled 2015-05-07 (×3): qty 1

## 2015-05-07 MED ORDER — ETOMIDATE 2 MG/ML IV SOLN
INTRAVENOUS | Status: AC
  Administered 2015-05-07: 20 mg
  Filled 2015-05-07: qty 20

## 2015-05-07 MED ORDER — SODIUM CHLORIDE 0.9 % IV SOLN
250.0000 mL | INTRAVENOUS | Status: DC | PRN
  Administered 2015-05-07: 10 mL via INTRAVENOUS

## 2015-05-07 MED ORDER — CETYLPYRIDINIUM CHLORIDE 0.05 % MT LIQD
7.0000 mL | Freq: Four times a day (QID) | OROMUCOSAL | Status: DC
  Administered 2015-05-08 – 2015-05-22 (×53): 7 mL via OROMUCOSAL

## 2015-05-07 MED ORDER — ATENOLOL 50 MG PO TABS
50.0000 mg | ORAL_TABLET | Freq: Every day | ORAL | Status: DC
  Administered 2015-05-07 – 2015-05-09 (×3): 50 mg via ORAL
  Filled 2015-05-07 (×3): qty 1

## 2015-05-07 MED ORDER — HYDROCODONE-ACETAMINOPHEN 5-325 MG PO TABS: 1.0000 | ORAL_TABLET | Freq: Two times a day (BID) | ORAL | Status: DC

## 2015-05-07 MED ORDER — FAMOTIDINE 20 MG PO TABS
20.0000 mg | ORAL_TABLET | Freq: Two times a day (BID) | ORAL | Status: DC
  Filled 2015-05-07 (×2): qty 1

## 2015-05-07 MED ORDER — SUCCINYLCHOLINE CHLORIDE 20 MG/ML IJ SOLN
INTRAMUSCULAR | Status: AC
  Administered 2015-05-07: 100 mg
  Filled 2015-05-07: qty 1

## 2015-05-07 MED ORDER — PREGABALIN 75 MG PO CAPS
200.0000 mg | ORAL_CAPSULE | Freq: Two times a day (BID) | ORAL | Status: DC
  Administered 2015-05-07: 200 mg via ORAL
  Filled 2015-05-07: qty 2
  Filled 2015-05-07: qty 1

## 2015-05-07 MED ORDER — PRO-STAT SUGAR FREE PO LIQD
30.0000 mL | Freq: Two times a day (BID) | ORAL | Status: DC
  Administered 2015-05-07: 14:00:00
  Administered 2015-05-07 – 2015-05-11 (×8): 30 mL
  Filled 2015-05-07 (×10): qty 30

## 2015-05-07 MED ORDER — MONTELUKAST SODIUM 10 MG PO TABS
10.0000 mg | ORAL_TABLET | Freq: Every day | ORAL | Status: DC
  Filled 2015-05-07: qty 1

## 2015-05-07 MED ORDER — PIPERACILLIN-TAZOBACTAM 3.375 G IVPB: 3.3750 g | Freq: Three times a day (TID) | INTRAVENOUS | Status: DC

## 2015-05-07 MED ORDER — BUDESONIDE 0.25 MG/2ML IN SUSP
0.2500 mg | Freq: Two times a day (BID) | RESPIRATORY_TRACT | Status: DC
  Administered 2015-05-07 – 2015-05-11 (×9): 0.25 mg via RESPIRATORY_TRACT
  Filled 2015-05-07 (×11): qty 2

## 2015-05-07 MED ORDER — PIPERACILLIN-TAZOBACTAM 3.375 G IVPB 30 MIN
3.3750 g | Freq: Once | INTRAVENOUS | Status: AC
  Administered 2015-05-07: 3.375 g via INTRAVENOUS
  Filled 2015-05-07: qty 50

## 2015-05-07 MED ORDER — VITAMIN D (ERGOCALCIFEROL) 1.25 MG (50000 UNIT) PO CAPS: 50000.0000 [IU] | ORAL_CAPSULE | ORAL | Status: DC

## 2015-05-07 MED ORDER — PROPOFOL 1000 MG/100ML IV EMUL
5.0000 ug/kg/min | INTRAVENOUS | Status: DC
  Administered 2015-05-07: 5 ug/kg/min via INTRAVENOUS

## 2015-05-07 MED ORDER — ATORVASTATIN CALCIUM 80 MG PO TABS
80.0000 mg | ORAL_TABLET | Freq: Every evening | ORAL | Status: DC
  Filled 2015-05-07: qty 1

## 2015-05-07 MED ORDER — LATANOPROST 0.005 % OP SOLN
1.0000 [drp] | Freq: Every day | OPHTHALMIC | Status: DC
  Administered 2015-05-07 – 2015-05-21 (×15): 1 [drp] via OPHTHALMIC
  Filled 2015-05-07 (×3): qty 2.5

## 2015-05-07 MED ORDER — CHLORHEXIDINE GLUCONATE 0.12 % MT SOLN
15.0000 mL | Freq: Two times a day (BID) | OROMUCOSAL | Status: DC
  Administered 2015-05-07 – 2015-05-22 (×25): 15 mL via OROMUCOSAL
  Filled 2015-05-07 (×24): qty 15

## 2015-05-07 MED ORDER — BUDESONIDE-FORMOTEROL FUMARATE 160-4.5 MCG/ACT IN AERO
2.0000 | INHALATION_SPRAY | Freq: Two times a day (BID) | RESPIRATORY_TRACT | Status: DC
  Filled 2015-05-07: qty 6

## 2015-05-07 NOTE — ED Provider Notes (Signed)
CSN: [id number]     Arrival date & time 05/07/15  0240 History  This chart was scribed for Loren Racer, MD by Abel Presto, ED Scribe. This patient was seen in room A08C/A08C and the patient's care was started at 2:38 AM.     Chief Complaint  Patient presents with  . Altered Mental Status  . Fever   LEVEL 5 CAVEAT  The history is provided by the EMS personnel. The history is limited by the condition of the patient. No language interpreter was used.   HPI Comments: Valerie Rodriguez is a 64 y.o. female brought in by ambulance from [facility] care, who presents to the Emergency Department with AMS with unknown onset. Pt less responsive and febrile upon EMS arrival. Eye movement noted per EMS en route to ED, currently not speaking and not following commands. Pt was recently treated with 1 course of Abx for pneumonia. Per EMS pt had 2 episodes of apnea 1-3 seconds long en route to ED. Nursing note reports: pt is scheduled to be given feeding tube on 05/09/15.    Past Medical History  Diagnosis Date  . Hypertension   . Hyperlipemia   . Deaf-mutism   . Glaucoma     Dr Eulah Pont  . Diabetes mellitus     Type II, sees Dr. Candie Chroman  . Allergy   . Asthma     sees Dr [place] Callas  . Stroke 11-05-10    sees Dr. Pearlean Brownie  . Chronic kidney disease     sees Dr. Camille Bal   . PVD (peripheral vascular disease)     sees Dr. Hart Rochester   . PAD (peripheral artery disease)    Past Surgical History  Procedure Laterality Date  . Cholecystectomy    . Abdominal hysterectomy      oophorectomy 1976  . Rotator cuff repair  2004  . Colonoscopy  03-30-09    per Dr. Marina Goodell, poor prep, repeat one yr    Family History  Problem Relation Age of Onset  . Hypertension      family hx  . Coronary artery disease      female 1st degree relative  . Hyperlipidemia      family hx  . Diabetes      1st degree relative  . Diabetes Mother   . Heart disease Mother   . Hypertension Mother   . Hyperlipidemia  Mother    History  Substance Use Topics  . Smoking status: Former Smoker    Types: Cigarettes    Quit date: 12/21/1979  . Smokeless tobacco: Never Used  . Alcohol Use: No   OB History    No data available     Review of Systems  Unable to perform ROS: Patient unresponsive      Allergies  Review of patient's allergies indicates no known allergies.  Home Medications   Prior to Admission medications   Medication Sig Start Date End Date Taking? Authorizing Provider  albuterol (PROVENTIL HFA;VENTOLIN HFA) 108 (90 BASE) MCG/ACT inhaler Inhale 2 puffs into the lungs every 6 (six) hours as needed for wheezing or shortness of breath.    Historical Provider, MD  albuterol-ipratropium (COMBIVENT) 18-103 MCG/ACT inhaler Inhale 2 puffs into the lungs 4 (four) times daily. 01/06/14   Nelwyn Salisbury, MD  atenolol (TENORMIN) 50 MG tablet Take 1 tablet (50 mg total) by mouth daily. 01/06/14   Nelwyn Salisbury, MD  atorvastatin (LIPITOR) 80 MG tablet Take 1 tablet (80 mg total) by  mouth daily. Patient taking differently: Take 80 mg by mouth every evening.  01/06/14   Nelwyn Salisbury, MD  azelastine (ASTELIN) 137 MCG/SPRAY nasal spray Place 1 spray into both nostrils 2 (two) times daily. Use in each nostril as directed 01/06/14   Nelwyn Salisbury, MD  budesonide-formoterol [facility]) 160-4.5 MCG/ACT inhaler Inhale 2 puffs into the lungs 2 (two) times daily. 01/06/14   Nelwyn Salisbury, MD  citalopram (CELEXA) 10 MG tablet Take 15 mg by mouth daily.    Historical Provider, MD  clopidogrel (PLAVIX) 75 MG tablet Take 1 tablet (75 mg total) by mouth daily. Patient taking differently: Take 75 mg by mouth at bedtime.  01/06/14   Nelwyn Salisbury, MD  docusate sodium (COLACE) 100 MG capsule Take 100 mg by mouth every morning.    Historical Provider, MD  doxycycline (VIBRA-TABS) 100 MG tablet Take 1 tablet (100 mg total) by mouth every 12 (twelve) hours. 04/12/15   Dorothea Ogle, MD  enalapril (VASOTEC) 2.5 MG tablet Take 2.5  mg by mouth every morning.  04/06/14   Historical Provider, MD  ferrous sulfate 325 (65 FE) MG tablet Take 325 mg by mouth every morning.    Historical Provider, MD  fluticasone (FLONASE) 50 MCG/ACT nasal spray Place 2 sprays into both nostrils daily. Patient taking differently: Place 1 spray into both nostrils daily.  01/06/14   Nelwyn Salisbury, MD  Fluticasone Furoate-Vilanterol 100-25 MCG/INH AEPB Inhale 1 puff into the lungs every morning.    Historical Provider, MD  furosemide (LASIX) 20 MG tablet Take 1 tablet (20 mg total) by mouth daily. 01/01/14   Nelwyn Salisbury, MD  hydrALAZINE (APRESOLINE) 50 MG tablet Take 1 tablet (50 mg total) by mouth 2 (two) times daily. Patient not taking: Reported on 04/08/2015 01/06/14   Nelwyn Salisbury, MD  HYDROcodone-acetaminophen (NORCO/VICODIN) 5-325 MG per tablet Take 1 tablet by mouth 2 (two) times daily. 04/12/15   Dorothea Ogle, MD  insulin glargine (LANTUS) 100 UNIT/ML injection Inject 0.2 mLs (20 Units total) into the skin at bedtime. 01/14/13   Richarda Overlie, MD  insulin lispro (HUMALOG) 100 UNIT/ML KiwkPen Inject 10 Units into the skin 3 (three) times daily before meals.    Historical Provider, MD  ipratropium-albuterol (DUONEB) 0.5-2.5 (3) MG/3ML SOLN Take 3 mLs by nebulization every 4 (four) hours.     Historical Provider, MD  latanoprost (XALATAN) 0.005 % ophthalmic solution Place 1 drop into both eyes at bedtime.      Historical Provider, MD  montelukast (SINGULAIR) 10 MG tablet Take 1 tablet (10 mg total) by mouth at bedtime. 01/06/14   Nelwyn Salisbury, MD  pantoprazole (PROTONIX) 40 MG tablet Take 1 tablet (40 mg total) by mouth daily. 01/06/14   Nelwyn Salisbury, MD  polyethylene glycol [facility]) packet Take 17 g by mouth every morning.    Historical Provider, MD  pregabalin (LYRICA) 200 MG capsule Take 1 capsule (200 mg total) by mouth 2 (two) times daily. 01/06/14   Nelwyn Salisbury, MD  tuberculin 5 UNIT/0.1ML injection Inject 0.1 mLs into the skin See  admin instructions. Every 10 days    Historical Provider, MD  Vitamin D, Ergocalciferol, (DRISDOL) 50000 UNITS CAPS capsule Take 50,000 Units by mouth every 30 (thirty) days.    Historical Provider, MD   BP 103/65 mmHg  Pulse 85  Resp 16  Wt 196 lb 3.4 oz (89 kg)  SpO2 100% Physical Exam  Constitutional: She appears  well-developed and well-nourished. No distress.  HENT:  Head: Normocephalic and atraumatic.  Eyes: EOM are normal. Pupils are equal, round, and reactive to light.  Neck: Normal range of motion. Neck supple.  No meningismus  Cardiovascular: Normal rate and regular rhythm.   Pulmonary/Chest: Effort normal. No respiratory distress. She has no wheezes. She has rales (bilateral Rales).  Abdominal: Soft. Bowel sounds are normal. She exhibits no distension and no mass. There is no tenderness. There is no rebound and no guarding.  Musculoskeletal: Normal range of motion. She exhibits no edema or tenderness.  Neurological:  Patient is awake and opening eyes to voice. She is not following commands. Not spontaneously moving arms or legs.  Skin: Skin is warm and dry. No rash noted. No erythema.  Psychiatric: She has a normal mood and affect. Her behavior is normal.  Nursing note and vitals reviewed.   ED Course  INTUBATION Date/Time: 05/07/2015 3:33 AM Performed by: Loren Racer Authorized by: Ranae Palms, Britiny Defrain Consent: The procedure was performed in an emergent situation. Patient identity confirmed: arm band Indications: hypoxemia and  respiratory failure Intubation method: direct Preoxygenation: BVM Sedatives: etomidate Paralytic: succinylcholine Laryngoscope size: Mac 3 Tube size: 7.5 mm Tube type: cuffed Number of attempts: 1 Cricoid pressure: yes Cords visualized: yes Post-procedure assessment: chest rise and CO2 detector Breath sounds: equal and absent over the epigastrium Cuff inflated: yes ETT to lip: 24 cm Tube secured with: ETT holder Chest x-ray  interpreted by me. Chest x-ray findings: endotracheal tube in appropriate position Patient tolerance: Patient tolerated the procedure well with no immediate complications   (including critical care time) DIAGNOSTIC STUDIES: Oxygen Saturation is 96% on 4L O2, normal by my interpretation.    COORDINATION OF CARE: 2:46 AM Discussed treatment plan with patient at beside, the patient agrees with the plan and has no further questions at this time.   Labs Review Labs Reviewed  CBC WITH DIFFERENTIAL/PLATELET - Abnormal; Notable for the following:    Hemoglobin 11.5 (*)    MCH 25.8 (*)    RDW 17.6 (*)    Monocytes Absolute 1.1 (*)    All other components within normal limits  COMPREHENSIVE METABOLIC PANEL - Abnormal; Notable for the following:    Sodium 154 (*)    Chloride 115 (*)    Glucose, Bld 388 (*)    BUN 89 (*)    Creatinine, Ser 3.28 (*)    Albumin 2.6 (*)    AST 51 (*)    GFR calc non Af Amer 14 (*)    GFR calc Af Amer 16 (*)    All other components within normal limits  I-STAT TROPOININ, ED - Abnormal; Notable for the following:    Troponin i, poc 0.95 (*)    All other components within normal limits  CBG MONITORING, ED - Abnormal; Notable for the following:    Glucose-Capillary 356 (*)    All other components within normal limits  CULTURE, BLOOD (ROUTINE X 2)  CULTURE, BLOOD (ROUTINE X 2)  URINALYSIS, ROUTINE W REFLEX MICROSCOPIC (NOT AT [facility])  I-STAT CG4 LACTIC ACID, ED    Imaging Review Dg Chest Port 1 View  05/07/2015   CLINICAL DATA:  Altered mental status, acute onset. Initial encounter.  EXAM: PORTABLE CHEST - 1 VIEW  COMPARISON:  Chest radiograph performed 04/08/2015  FINDINGS: The lungs remain hypoexpanded. Left basilar airspace opacity may reflect atelectasis or possibly mild pneumonia. No definite pleural effusion or pneumothorax is seen.  The cardiomediastinal silhouette is enlarged. No  acute osseous abnormalities are identified.  IMPRESSION: Lungs remain  hypoexpanded. Left basilar airspace opacity may reflect atelectasis or possibly mild pneumonia. Cardiomegaly noted.   Electronically Signed   By: Roanna Raider M.D.   On: 05/07/2015 03:44     EKG Interpretation   Date/Time:  Saturday May 07 2015 02:50:43 EDT Ventricular Rate:  95 PR Interval:  118 QRS Duration: 72 QT Interval:  472 QTC Calculation: 593 R Axis:   45 Text Interpretation:  Sinus rhythm Borderline short PR interval Borderline  T abnormalities, anterior leads Prolonged QT interval Confirmed by  Ranae Palms  MD, Emitt Maglione ([id number]) on 05/07/2015 4:16:58 AM     CRITICAL CARE Performed by: Ranae Palms, Derreck Wiltsey Total critical care time: 50 min Critical care time was exclusive of separately billable procedures and treating other patients. Critical care was necessary to treat or prevent imminent or life-threatening deterioration. Critical care was time spent personally by me on the following activities: development of treatment plan with patient and/or surrogate as well as nursing, discussions with consultants, evaluation of patient's response to treatment, examination of patient, obtaining history from patient or surrogate, ordering and performing treatments and interventions, ordering and review of laboratory studies, ordering and review of radiographic studies, pulse oximetry and re-evaluation of patient's condition.  MDM   Final diagnoses:  Altered mental status  Healthcare-associated pneumonia  Hypernatremia  Renal failure  Hyperglycemia  Acute respiratory failure with hypoxia   I personally performed the services described in this documentation, which was scribed in my presence. The recorded information has been reviewed and is accurate.  Patient with hypoxia despite nonrebreather. Coarse breath sounds bilaterally. Questionable pneumonia on x-ray. Initiated sepsis protocol. Decision made to intubate. Patient noted to have elevated troponin. EKG without any ischemic findings.  Patient also hyperglycemic and hypernatremic. Discussed with critical care and will see patient in emergency department.    Loren Racer, MD 05/07/15 [phone]

## 2015-05-07 NOTE — Progress Notes (Signed)
Initial Nutrition Assessment  DOCUMENTATION CODES:   Obesity unspecified  INTERVENTION:    Initiate TF via OGT with Vital High Protein at 25 ml/h and Prostat 30 ml BID on day 1; on day 2, increase to goal rate of 50 ml/h (1200 ml per day) to provide 1200 kcals, 105 gm protein, 1003 ml free water daily.  NUTRITION DIAGNOSIS:   Inadequate oral intake related to inability to eat as evidenced by NPO status.  GOAL:   Provide needs based on ASPEN/SCCM guidelines  MONITOR:   Vent status, Labs, Weight trends, TF tolerance  REASON FOR ASSESSMENT:   Consult Enteral/tube feeding initiation and management  ASSESSMENT:   Patient admitted on 7/23 with acute hypoxemic respiratory failure. Hx of being blind and deaf, and recent PNA.  Patient is currently intubated on ventilator support. Received MD Consult for TF initiation and management. Being treated for sepsis, likely HCAP. MV: 5.7 L/min Temp (24hrs), Avg:99.8 F (37.7 C), Min:99.8 F (37.7 C), Max:99.8 F (37.7 C)  Propofol: off  Labs reviewed: sodium, BUN, & creatinine are elevated  Diet Order:  Diet NPO time specified  Skin:  Reviewed, no issues  Last BM:  unknown  Height:   Ht Readings from Last 1 Encounters:  05/07/15  (1.549 m)    Weight:   Wt Readings from Last 1 Encounters:  05/07/15 196 lb 3.4 oz (89 kg)    Ideal Body Weight:  47.7 kg  Wt Readings from Last 10 Encounters:  05/07/15 196 lb 3.4 oz (89 kg)  04/12/15 196 lb 3.4 oz (89 kg)  03/26/15 216 lb 7.9 oz (98.2 kg)  03/13/13 154 lb 4 oz (69.967 kg)  01/11/13 95 lb 3.8 oz (43.2 kg)  12/30/12 127 lb (57.607 kg)  09/16/12 147 lb (66.679 kg)  12/21/11 133 lb (60.328 kg)  12/14/11 128 lb (58.06 kg)  07/24/10 183 lb (83.008 kg)    BMI:  Body mass index is 37.09 kg/(m^2).  Estimated Nutritional Needs:   Kcal:  161-0960  Protein:  95-120 gm  Fluid:  1.5 L  EDUCATION NEEDS:   No education needs identified at this time   Joaquin Courts, RD, LDN, CNSC Pager [phone] After Hours Pager [phone]

## 2015-05-07 NOTE — Progress Notes (Signed)
1019 Dr. Delton Coombes and Dr. Teodoro Kil Rumely made aware of CBG 412. RN covered with 20 units and notified MD per orders. Instructions per Alfonse Ras MD to obtain next CBG with next four hours scheduled CBG draw.  Also notified of ETT repositioned and OGT advanced, will obtain xray for verification.

## 2015-05-07 NOTE — ED Notes (Addendum)
Intensivist, MD at bedside 

## 2015-05-07 NOTE — Progress Notes (Signed)
Withdrew ET tube 1cm per MD. Patient's ETT is 25 at the lip.

## 2015-05-07 NOTE — ED Notes (Signed)
Pt presents with [facility] from [facility] for unresponsiveness for unknown period of time; facility reports pt being tx for pneumonia and given 1 round of ABX; pt presents febrile, nonverbal, and not following commands; facility also reports pt to be given feeding tube on coming Monday; EMS reports x2 episodes of apnea lasting 3-4 seconds

## 2015-05-07 NOTE — H&P (Addendum)
PULMONARY / CRITICAL CARE MEDICINE   Name: Valerie Rodriguez MRN: [id number] DOB: 11-08-1950    ADMISSION DATE:  05/07/2015 CONSULTATION DATE:  05/07/2015  REFERRING MD :  Dr. 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  She was in her usual state of health (communicative and interactive with sign language) until 2 months ago when she developed several bouts of PNA.  The most recent one 2 weeks ago for which she was treated but over the last two weeks her daughter states she has been minimally interactive (if at all).  Today she was at her facility and she was found un responsive with O2 sats in the 60-70s.  On arrival to the ED she was 80s on NRB mask.  She was then inutabed for hypoxemia and found to have thick tenatcious secretions and peri-intubation hypotension that resolved with fluid.  PAST MEDICAL HISTORY :   has a past medical history of Hypertension; Hyperlipemia; Deaf-mutism; Glaucoma; Diabetes mellitus; Allergy; Asthma; Stroke (11-05-10); Chronic kidney disease; PVD (peripheral vascular disease); and PAD (peripheral artery disease).  has past surgical history that includes Cholecystectomy; Abdominal hysterectomy; Rotator cuff repair (2004); and Colonoscopy (03-30-09). Prior to Admission medications   Medication Sig Start Date End Date Taking? Authorizing Provider  albuterol (PROVENTIL HFA;VENTOLIN HFA) 108 (90 BASE) MCG/ACT inhaler Inhale 2 puffs into the lungs every 6 (six) hours as needed for wheezing or shortness of breath.    Historical Provider, MD  albuterol-ipratropium (COMBIVENT) 18-103 MCG/ACT inhaler Inhale 2 puffs into the lungs 4 (four) times daily. 01/06/14   Nelwyn Salisbury, MD  atenolol (TENORMIN) 50 MG tablet Take 1 tablet (50 mg total) by mouth daily. 01/06/14   Nelwyn Salisbury, MD  atorvastatin (LIPITOR) 80 MG tablet Take 1 tablet (80 mg total) by mouth daily. Patient taking differently:  Take 80 mg by mouth every evening.  01/06/14   Nelwyn Salisbury, MD  azelastine (ASTELIN) 137 MCG/SPRAY nasal spray Place 1 spray into both nostrils 2 (two) times daily. Use in each nostril as directed 01/06/14   Nelwyn Salisbury, MD  budesonide-formoterol [facility]) 160-4.5 MCG/ACT inhaler Inhale 2 puffs into the lungs 2 (two) times daily. 01/06/14   Nelwyn Salisbury, MD  citalopram (CELEXA) 10 MG tablet Take 15 mg by mouth daily.    Historical Provider, MD  clopidogrel (PLAVIX) 75 MG tablet Take 1 tablet (75 mg total) by mouth daily. Patient taking differently: Take 75 mg by mouth at bedtime.  01/06/14   Nelwyn Salisbury, MD  docusate sodium (COLACE) 100 MG capsule Take 100 mg by mouth every morning.    Historical Provider, MD  doxycycline (VIBRA-TABS) 100 MG tablet Take 1 tablet (100 mg total) by mouth every 12 (twelve) hours. 04/12/15   Dorothea Ogle, MD  enalapril (VASOTEC) 2.5 MG tablet Take 2.5 mg by mouth every morning.  04/06/14   Historical Provider, MD  ferrous sulfate 325 (65 FE) MG tablet Take 325 mg by mouth every morning.    Historical Provider, MD  fluticasone (FLONASE) 50 MCG/ACT nasal spray Place 2 sprays into both nostrils daily. Patient taking differently: Place 1 spray into both nostrils daily.  01/06/14   Nelwyn Salisbury, MD  Fluticasone Furoate-Vilanterol 100-25 MCG/INH AEPB Inhale 1 puff into the lungs every morning.    Historical Provider, MD  furosemide (LASIX) 20 MG tablet Take 1 tablet (20 mg total) by mouth  daily. 01/01/14   Nelwyn Salisbury, MD  hydrALAZINE (APRESOLINE) 50 MG tablet Take 1 tablet (50 mg total) by mouth 2 (two) times daily. Patient not taking: Reported on 04/08/2015 01/06/14   Nelwyn Salisbury, MD  HYDROcodone-acetaminophen (NORCO/VICODIN) 5-325 MG per tablet Take 1 tablet by mouth 2 (two) times daily. 04/12/15   Dorothea Ogle, MD  insulin glargine (LANTUS) 100 UNIT/ML injection Inject 0.2 mLs (20 Units total) into the skin at bedtime. 01/14/13   Richarda Overlie, MD  insulin lispro  (HUMALOG) 100 UNIT/ML KiwkPen Inject 10 Units into the skin 3 (three) times daily before meals.    Historical Provider, MD  ipratropium-albuterol (DUONEB) 0.5-2.5 (3) MG/3ML SOLN Take 3 mLs by nebulization every 4 (four) hours.     Historical Provider, MD  latanoprost (XALATAN) 0.005 % ophthalmic solution Place 1 drop into both eyes at bedtime.      Historical Provider, MD  montelukast (SINGULAIR) 10 MG tablet Take 1 tablet (10 mg total) by mouth at bedtime. 01/06/14   Nelwyn Salisbury, MD  pantoprazole (PROTONIX) 40 MG tablet Take 1 tablet (40 mg total) by mouth daily. 01/06/14   Nelwyn Salisbury, MD  polyethylene glycol [facility] Ethelene Hal) packet Take 17 g by mouth every morning.    Historical Provider, MD  pregabalin (LYRICA) 200 MG capsule Take 1 capsule (200 mg total) by mouth 2 (two) times daily. 01/06/14   Nelwyn Salisbury, MD  tuberculin 5 UNIT/0.1ML injection Inject 0.1 mLs into the skin See admin instructions. Every 10 days    Historical Provider, MD  Vitamin D, Ergocalciferol, (DRISDOL) 50000 UNITS CAPS capsule Take 50,000 Units by mouth every 30 (thirty) days.    Historical Provider, MD   No Known Allergies  FAMILY HISTORY:  indicated that her mother is deceased. She indicated that her father is deceased.  SOCIAL HISTORY:  reports that she quit smoking about 35 years ago. Her smoking use included Cigarettes. She has never used smokeless tobacco. She reports that she does not drink alcohol or use illicit drugs.  REVIEW OF SYSTEMS:   12 point review of systems was performed and negatve other then the above states.   VITAL SIGNS: Pulse Rate:  [80-95] 86 (07/23 0600) Resp:  [12-28] 19 (07/23 0600) BP: (84-172)/(48-112) 140/55 mmHg (07/23 0600) SpO2:  [89 %-100 %] 100 % (07/23 0600) FiO2 (%):  [60 %-90 %] 60 % (07/23 0500) Weight:  [89 kg (196 lb 3.4 oz)] 89 kg (196 lb 3.4 oz) (07/23 0330) HEMODYNAMICS:   VENTILATOR SETTINGS: Vent Mode:  [-] PRVC FiO2 (%):  [60 %-90 %] 60 % Set Rate:   [16 bmp-18 bmp] 18 bmp Vt Set:  [310 mL-500 mL] 310 mL PEEP:  [5 cmH20] 5 cmH20 Plateau Pressure:  [16 cmH20] 16 cmH20 INTAKE / OUTPUT:  Intake/Output Summary (Last 24 hours) at 05/07/15 0626 Last data filed at 05/07/15 0435  Gross per 24 hour  Intake   2000 ml  Output      0 ml  Net   2000 ml    PHYSICAL EXAMINATION: General:  Intubated and sedated.  Arrousible only to pain Neuro:  Unable to assess HEENT:  Intubated, short neck Cardiovascular:  Tachycardic, regular. No m/r/g Lungs:  Diffusely wheezing with rhonchi Abdomen:  Soft non-distended non tender. Normal bowel sounds LE: no c/c/e  LABS:  CBC  Recent Labs Lab 05/07/15 0257  WBC 10.0  HGB 11.5*  HCT 37.3  PLT 168   Coag's No results for  input(s): APTT, INR in the last 168 hours. BMET  Recent Labs Lab 05/07/15 0257  NA 154*  K 4.8  CL 115*  CO2 25  BUN 89*  CREATININE 3.28*  GLUCOSE 388*   Electrolytes  Recent Labs Lab 05/07/15 0257  CALCIUM 8.9   Sepsis Markers  Recent Labs Lab 05/07/15 0310  LATICACIDVEN 1.84   ABG  Recent Labs Lab 05/07/15 0517  PHART 7.337*  PCO2ART 46.4*  PO2ART 251.0*   Liver Enzymes  Recent Labs Lab 05/07/15 0257  AST 51*  ALT 18  ALKPHOS 61  BILITOT 0.7  ALBUMIN 2.6*   Cardiac Enzymes No results for input(s): TROPONINI, PROBNP in the last 168 hours. Glucose  Recent Labs Lab 05/07/15 0252  GLUCAP 356*    Imaging Dg Chest Port 1 View  05/07/2015   CLINICAL DATA:  Endotracheal tube placement.  Initial encounter.  EXAM: PORTABLE CHEST - 1 VIEW  COMPARISON:  Chest radiograph performed earlier today at 3:01 a.m.  FINDINGS: The patient's endotracheal tube is noted extending just below the carina, overlying the right mainstem bronchus. This could be retracted approximately 3 cm. The enteric tube is noted extending below the diaphragm, with the side port noted about the distal esophagus. This could be advanced 8 cm, as deemed clinically appropriate.   The lungs are hypoexpanded. Right perihilar airspace opacity may reflect atelectasis, with mild right-sided volume loss noted. No pleural effusion or pneumothorax is seen.  The cardiomediastinal silhouette is borderline normal in size. No acute osseous abnormalities identified.  IMPRESSION: 1. Endotracheal tube seen ending just below the carina, overlying the right mainstem bronchus. This could be retracted approximately 3 cm. 2. Enteric tube seen extending below the diaphragm, with the side port about the distal esophagus. This could be advanced 8 cm, is deemed clinically appropriate. 3. Lungs hypoexpanded. Right perihilar airspace opacity may reflect atelectasis, with mild right-sided volume loss noted. These results were called by telephone at the time of interpretation on 05/07/2015 at 5:48 am to Dr. Loren Racer, who verbally acknowledged these results.   Electronically Signed   By: Roanna Raider M.D.   On: 05/07/2015 05:48   Dg Chest Port 1 View  05/07/2015   CLINICAL DATA:  Altered mental status, acute onset. Initial encounter.  EXAM: PORTABLE CHEST - 1 VIEW  COMPARISON:  Chest radiograph performed 04/08/2015  FINDINGS: The lungs remain hypoexpanded. Left basilar airspace opacity may reflect atelectasis or possibly mild pneumonia. No definite pleural effusion or pneumothorax is seen.  The cardiomediastinal silhouette is enlarged. No acute osseous abnormalities are identified.  IMPRESSION: Lungs remain hypoexpanded. Left basilar airspace opacity may reflect atelectasis or possibly mild pneumonia. Cardiomegaly noted.   Electronically Signed   By: Roanna Raider M.D.   On: 05/07/2015 03:44     ASSESSMENT / PLAN: 11F with acute hypoxemic and hypercapnic respiratory failure 2/2 HCAP vs obstructive lung disease vs decompensated HFpEF from reduced preload likely due to decreased PO intake with resulting AKI.  PULMONARY A: acute on chronic hypoxemic/hypercapnic resp failure Suspect COPD exacerbation 2/2  HCAP P:   - Sputum cx - RVP - Ustrep and legionella ab - Vanc/zosyn - duoneb Q4h scheduled - Low tidal volume ventilation - PPI - blood cx - procalcitonin - Methylpred 40mg  IV Qday  CARDIOVASCULAR A: Hypotenion peri-intubation likely 2/2 reduced RV preload with intubation in conjunction with propofol and sepsis.  Elevated troponin likely Type II given sepsis and CHF exacerbation.   P:  - s/p3L volume resucitation with good  BP response and increased UOP - check lactate - cont LR at 250cc/hr x4hours - IO CVC removed. - Trend troponin - no heparin gtt at this time.  RENAL A:  AKI on CKD.  ATN with granular casts in UA. Given decreased PO intake and hyperNa with increased UOP after volume resucitation suspect ATN after prolonged pre-renal azotemia.  UOP now adequate P:   - cont volume resuscitation and reeval Scr in AM. - No indication for emergent HD now - HyperNa likely hypovolemic.  Reassess after volume resuscitation.  INFECTIOUS A:  Sepsis source likely HCAP P:   BCx2 05/07/2015 UC None Sputum 7/23/ Abx: Vanc zosyn, start date7/23  NEUROLOGIC A:  Likely septic encephalopathy with hypercapnic narcosis, medication effect in the setting or poor baseline.  Would be concerned for long term prognosis P:   - RASS goal: 0 while on vent - no plan for LP given poor baseline and alternative explanation - exam is c/w global encephalopathy at this time no indication for CT head.    FAMILY  - Updates: daughter updated tonight.  #) Limited Code.  No CPR.  Per daughter would want trial of intubation with aggressive medical therapy x7days but then would move to comfort if patient is not improving.   Galvin Proffer Pulmonary and Critical Care Medicine [facility] Pager: [phone]  05/07/2015, 6:26 AM   ADDENDUM: This portion of the documentation is late and is for billing and coding only.  Total critical care time: 45 min  Critical care time was exclusive of  separately billable procedures and treating other patients.  Critical care was necessary to treat or prevent imminent or life-threatening deterioration.  Critical care was time spent personally by me on the following activities: development of treatment plan with patient and/or surrogate as well as nursing, discussions with consultants, evaluation of patient's response to treatment, examination of patient, obtaining history from patient or surrogate, ordering and performing treatments and interventions, ordering and review of laboratory studies, ordering and review of radiographic studies, pulse oximetry and re-evaluation of patient's condition.   Galvin Proffer, DO., MS [place] [facility]

## 2015-05-07 NOTE — ED Notes (Signed)
CBG checked 356

## 2015-05-07 NOTE — Progress Notes (Signed)
0800 sacral wound pressure ulcers noted to sacral crease. Stage 2. Two quarter size open areas noted to left inner buttock and open wound area to right inner buttock. Foam sacral pad placed to sacral area. Wound care consult placed.

## 2015-05-07 NOTE — ED Notes (Signed)
Message from secretary Sallyanne Havers called, daughter, who is "responsible party", phone number [phone], or Jetty Duhamel, daughter 2nd daughter, [phone].

## 2015-05-07 NOTE — ED Notes (Signed)
Daughter at the bedside

## 2015-05-07 NOTE — Progress Notes (Signed)
ANTIBIOTIC CONSULT NOTE - INITIAL  Pharmacy Consult for Vancomycin + Zosyn (per MD) Indication: HCAP  No Known Allergies  Patient Measurements: Height: 4\' 9"  (144.8 cm) Weight: 196 lb 3.4 oz (89 kg) (from 04/12/15) IBW/kg (Calculated) : 38.6  Vital Signs: Temp: 99.8 F (37.7 C) (07/23 0722) Temp Source: Oral (07/23 0722) BP: 111/54 mmHg (07/23 0640) Pulse Rate: 83 (07/23 0640) Intake/Output from previous day: 07/22 0701 - 07/23 0700 In: 2000 [I.V.:2000] Out: 0  Intake/Output from this shift:    Labs:  Recent Labs  05/07/15 0257  WBC 10.0  HGB 11.5*  PLT 168  CREATININE 3.28*   Estimated Creatinine Clearance: 16.3 mL/min (by C-G formula based on Cr of 3.28). No results for input(s): VANCOTROUGH, VANCOPEAK, VANCORANDOM, GENTTROUGH, GENTPEAK, GENTRANDOM, TOBRATROUGH, TOBRAPEAK, TOBRARND, AMIKACINPEAK, AMIKACINTROU, AMIKACIN in the last 72 hours.   Microbiology: Recent Results (from the past 720 hour(s))  Blood culture (routine x 2)     Status: None   Collection Time: 04/08/15  4:00 PM  Result Value Ref Range Status   Specimen Description BLOOD LEFT HAND  Final   Special Requests BOTTLES DRAWN AEROBIC AND ANAEROBIC 5CC  Final   Culture  Setup Time   Final    GRAM POSITIVE COCCI IN CLUSTERS ANAEROBIC BOTTLE ONLY CRITICAL RESULT CALLED TO, READ BACK BY AND VERIFIED WITH: C. MILLER,RN AT [phone] BY S. YARBROUGH AEROBIC BOTTLE ONLY GRAM POSITIVE COCCI IN PAIRS IN CLUSTERS    Culture   Final    STAPHYLOCOCCUS SPECIES (COAGULASE NEGATIVE) THE SIGNIFICANCE OF ISOLATING THIS ORGANISM FROM A SINGLE SET OF BLOOD CULTURES WHEN MULTIPLE SETS ARE DRAWN IS UNCERTAIN. PLEASE NOTIFY THE MICROBIOLOGY DEPARTMENT WITHIN ONE WEEK IF SPECIATION AND SENSITIVITIES ARE REQUIRED.    Report Status 04/11/2015 FINAL  Final  Blood culture (routine x 2)     Status: None   Collection Time: 04/08/15  4:05 PM  Result Value Ref Range Status   Specimen Description BLOOD RIGHT HAND  Final   Special Requests BOTTLES DRAWN AEROBIC AND ANAEROBIC 5CC  Final   Culture NO GROWTH 5 DAYS  Final   Report Status 04/13/2015 FINAL  Final  Urine culture     Status: None   Collection Time: 04/08/15  9:24 PM  Result Value Ref Range Status   Specimen Description URINE, CATHETERIZED  Final   Special Requests NONE  Final   Culture   Final    MULTIPLE SPECIES PRESENT, SUGGEST RECOLLECTION IF CLINICALLY INDICATED   Report Status 04/10/2015 FINAL  Final    Medical History: Past Medical History  Diagnosis Date  . Hypertension   . Hyperlipemia   . Deaf-mutism   . Glaucoma     Dr Eulah Pont  . Diabetes mellitus     Type II, sees Dr. Candie Chroman  . Allergy   . Asthma     sees Dr Dublin Callas  . Stroke 11-05-10    sees Dr. Pearlean Brownie  . Chronic kidney disease     sees Dr. Camille Bal   . PVD (peripheral vascular disease)     sees Dr. Hart Rochester   . PAD (peripheral artery disease)     Assessment: 79 YOF with hx recurrent PNA who presented on 6/23 with respiratory failure and hypoxia requiring intubation. Pharmacy was consulted to start Vancomycin along with Zosyn per MD for empiric HCAP coverage. Tmax/24h: 99.8, WBC 10.   The patient received a dose of Vancomycin 1g IV x 1 dose at 0420 this AM. The patient is noted to  be in acute renal failure, SCr 3.28 (baseline 1.1-1.5), CrCl~15-20 ml/min. Will give another small dose of Vancomycin this AM to complete load.  Goal of Therapy:  Vancomycin trough level 15-20 mcg/ml  Plan:  1. Vancomycin 500 mg IV x 1 dose (to make a total loading dose of 1500 mg) 2. Vancomycin 1g IV every 48 hours (next dose due on 7/25) 3. Continue Zosyn 2.25g IV every 6 hours 4. Will continue to follow renal function, culture results, LOT, and antibiotic de-escalation plans   Georgina Pillion, PharmD, BCPS Clinical Pharmacist Pager: [phone] 05/07/2015 8:51 AM

## 2015-05-07 NOTE — Progress Notes (Signed)
Withdrew ETT to 22cm at the lips per MD. RT will continue to monitor.

## 2015-05-08 DIAGNOSIS — J189 Pneumonia, unspecified organism: Secondary | ICD-10-CM

## 2015-05-08 LAB — BASIC METABOLIC PANEL
Anion gap: 14 (ref 5–15)
Anion gap: 7 (ref 5–15)
BUN: 83 mg/dL — AB (ref 6–20)
BUN: 76 mg/dL — ABNORMAL HIGH (ref 6–20)
CHLORIDE: 120 mmol/L — AB (ref 101–111)
CHLORIDE: 126 mmol/L — AB (ref 101–111)
CO2: 23 mmol/L (ref 22–32)
CO2: 26 mmol/L (ref 22–32)
Calcium: 8.3 mg/dL — ABNORMAL LOW (ref 8.9–10.3)
Calcium: 8.7 mg/dL — ABNORMAL LOW (ref 8.9–10.3)
Creatinine, Ser: 2.45 mg/dL — ABNORMAL HIGH (ref 0.44–1.00)
Creatinine, Ser: 2.19 mg/dL — ABNORMAL HIGH (ref 0.44–1.00)
GFR calc Af Amer: 23 mL/min — ABNORMAL LOW (ref >60)
GFR calc non Af Amer: 20 mL/min — ABNORMAL LOW (ref >60)
GFR calc non Af Amer: 23 mL/min — ABNORMAL LOW (ref >60)
GFR, EST AFRICAN AMERICAN: 26 mL/min — AB (ref >60)
Glucose, Bld: 438 mg/dL — ABNORMAL HIGH (ref 65–99)
Glucose, Bld: 359 mg/dL — ABNORMAL HIGH (ref 65–99)
POTASSIUM: 3.8 mmol/L (ref 3.5–5.1)
Potassium: 3.6 mmol/L (ref 3.5–5.1)
SODIUM: 159 mmol/L — AB (ref 135–145)
Sodium: 157 mmol/L — ABNORMAL HIGH (ref 135–145)

## 2015-05-08 LAB — GLUCOSE, CAPILLARY
GLUCOSE-CAPILLARY: 285 mg/dL — AB (ref 65–99)
GLUCOSE-CAPILLARY: 306 mg/dL — AB (ref 65–99)
Glucose-Capillary: 335 mg/dL — ABNORMAL HIGH (ref 65–99)
Glucose-Capillary: 306 mg/dL — ABNORMAL HIGH (ref 65–99)
Glucose-Capillary: 341 mg/dL — ABNORMAL HIGH (ref 65–99)
Glucose-Capillary: 422 mg/dL — ABNORMAL HIGH (ref 65–99)

## 2015-05-08 LAB — CBC
HCT: 29.8 % — ABNORMAL LOW (ref 36.0–46.0)
HEMOGLOBIN: 9.1 g/dL — AB (ref 12.0–15.0)
MCH: 25.6 pg — ABNORMAL LOW (ref 26.0–34.0)
MCHC: 30.5 g/dL (ref 30.0–36.0)
MCV: 83.9 fL (ref 78.0–100.0)
Platelets: 120 10*3/uL — ABNORMAL LOW (ref 150–400)
RBC: 3.55 MIL/uL — ABNORMAL LOW (ref 3.87–5.11)
RDW: 17.2 % — AB (ref 11.5–15.5)
WBC: 7 10*3/uL (ref 4.0–10.5)

## 2015-05-08 MED ORDER — PIPERACILLIN-TAZOBACTAM 3.375 G IVPB
3.3750 g | Freq: Three times a day (TID) | INTRAVENOUS | Status: DC
  Administered 2015-05-08 – 2015-05-09 (×2): 3.375 g via INTRAVENOUS
  Filled 2015-05-08 (×6): qty 50

## 2015-05-08 MED ORDER — VANCOMYCIN HCL IN DEXTROSE 1-5 GM/200ML-% IV SOLN
1000.0000 mg | INTRAVENOUS | Status: DC
  Administered 2015-05-08 – 2015-05-09 (×2): 1000 mg via INTRAVENOUS
  Filled 2015-05-08 (×2): qty 200

## 2015-05-08 MED ORDER — FREE WATER
300.0000 mL | Freq: Three times a day (TID) | Status: DC
  Administered 2015-05-08 – 2015-05-11 (×9): 300 mL

## 2015-05-08 MED ORDER — INSULIN ASPART 100 UNIT/ML ~~LOC~~ SOLN
0.0000 [IU] | Freq: Every day | SUBCUTANEOUS | Status: DC
  Administered 2015-05-08: 11 [IU] via SUBCUTANEOUS
  Administered 2015-05-08 (×3): 15 [IU] via SUBCUTANEOUS
  Administered 2015-05-08 (×2): 20 [IU] via SUBCUTANEOUS
  Administered 2015-05-09: 15 [IU] via SUBCUTANEOUS

## 2015-05-08 MED ORDER — INSULIN ASPART 100 UNIT/ML ~~LOC~~ SOLN
0.0000 [IU] | Freq: Every day | SUBCUTANEOUS | Status: DC
Start: 2015-05-08 — End: 2015-05-08

## 2015-05-08 MED ORDER — PREGABALIN 75 MG PO CAPS
75.0000 mg | ORAL_CAPSULE | Freq: Two times a day (BID) | ORAL | Status: DC
  Administered 2015-05-08 – 2015-05-22 (×24): 75 mg via ORAL
  Filled 2015-05-08 (×25): qty 1

## 2015-05-08 NOTE — Progress Notes (Signed)
Patient has been weaning at CPAP 5 / PS 5 for 2 hrs. RT increased PS to 8.

## 2015-05-08 NOTE — Progress Notes (Signed)
PULMONARY / CRITICAL CARE MEDICINE   Name: Valerie Rodriguez MRN: [id number] DOB: 1951-08-17    ADMISSION DATE:  05/07/2015 CONSULTATION DATE:  05/07/2015  REFERRING MD :  Dr. Ranae Palms  CHIEF COMPLAINT:  Acute hypoxemic respiratory failure  HISTORY OF PRESENT ILLNESS:   77 F with h/o being blind and deaf.  She was in her usual state of health (communicative and interactive with sign language) until 2 months ago when she developed several bouts of PNA.  The most recent one 2 weeks ago for which she was treated but over the last two weeks her daughter states she has been minimally interactive (if at all).  Today she was at her facility and she was found un responsive with O2 sats in the 60-70s.  On arrival to the ED she was 80s on NRB mask.  She was then intubated for hypoxemia and found to have thick tenatcious secretions and peri-intubation hypotension that resolved with fluid.  Subjective / Interval Hx:  Tolerates PSV Still minimally responsive, may have interacted w RN this am, followed command  VITAL SIGNS: Temp:  [98.8 F (37.1 C)-99.4 F (37.4 C)] 99.2 F (37.3 C) (07/24 0754) Pulse Rate:  [72-88] 78 (07/24 1000) Resp:  [14-26] 14 (07/24 1000) BP: (98-169)/(46-69) 141/59 mmHg (07/24 1000) SpO2:  [96 %-100 %] 100 % (07/24 1000) FiO2 (%):  [40 %-50 %] 40 % (07/24 0945) Weight:  [86.3 kg (190 lb 4.1 oz)] 86.3 kg (190 lb 4.1 oz) (07/24 0429) HEMODYNAMICS:   VENTILATOR SETTINGS: Vent Mode:  [-] Other (Comment) FiO2 (%):  [40 %-50 %] 40 % Set Rate:  [18 bmp] 18 bmp Vt Set:  [310 mL] 310 mL PEEP:  [5 cmH20] 5 cmH20 Pressure Support:  [5 cmH20-8 cmH20] 8 cmH20 Plateau Pressure:  [14 cmH20-19 cmH20] 14 cmH20 INTAKE / OUTPUT:  Intake/Output Summary (Last 24 hours) at 05/08/15 1027 Last data filed at 05/08/15 1002  Gross per 24 hour  Intake 2483.75 ml  Output   1490 ml  Net 993.75 ml    PHYSICAL EXAMINATION: General:  Intubated and sedated.   Neuro:  Minimally responsive,  opened eyes when stimulated, did not follow commands or track HEENT:  Intubated, short neck Cardiovascular:  Tachycardic, regular. No m/r/g Lungs:  Diffusely wheezing with rhonchi Abdomen:  Soft non-distended non tender. Normal bowel sounds LE: no c/c/e  LABS:  CBC  Recent Labs Lab 05/07/15 0257 05/07/15 1135 05/08/15 0232  WBC 10.0 8.1 7.0  HGB 11.5* 9.4* 9.1*  HCT 37.3 30.4* 29.8*  PLT 168 131* 120*   Coag's  Recent Labs Lab 05/07/15 1135  INR 1.46   BMET  Recent Labs Lab 05/07/15 0257 05/07/15 1135 05/08/15 0232  NA 154*  --  157*  K 4.8  --  3.6  CL 115*  --  120*  CO2 25  --  23  BUN 89*  --  83*  CREATININE 3.28* 2.99* 2.45*  GLUCOSE 388*  --  438*   Electrolytes  Recent Labs Lab 05/07/15 0257 05/07/15 1135 05/08/15 0232  CALCIUM 8.9  --  8.3*  MG  --  2.2  --   PHOS  --  4.0  --    Sepsis Markers  Recent Labs Lab 05/07/15 0310 05/07/15 0644 05/07/15 1135  LATICACIDVEN 1.84 1.8  --   PROCALCITON  --   --  0.74   ABG  Recent Labs Lab 05/07/15 0517  PHART 7.337*  PCO2ART 46.4*  PO2ART 251.0*   Liver Enzymes  Recent Labs Lab 05/07/15 0257  AST 51*  ALT 18  ALKPHOS 61  BILITOT 0.7  ALBUMIN 2.6*   Cardiac Enzymes No results for input(s): TROPONINI, PROBNP in the last 168 hours. Glucose  Recent Labs Lab 05/07/15 1014 05/07/15 1207 05/07/15 1534 05/07/15 2002 05/08/15 0418 05/08/15 0749  GLUCAP 412* 354* 261* 265* 335* 306*    Imaging Dg Chest Port 1 View  05/07/2015   CLINICAL DATA:  Recent endotracheal readjustment  EXAM: PORTABLE CHEST - 1 VIEW  COMPARISON:  04/27/2015  FINDINGS: The endotracheal tube now lies approximately 2 cm above the carina. A nasogastric catheter is seen within the stomach. Cardiac shadow is stable. Persistent right basilar atelectasis is noted. No other focal abnormality is noted.  IMPRESSION: Stable right basilar atelectasis.  The endotracheal tube as described.   Electronically Signed    By: Alcide Clever M.D.   On: 05/07/2015 11:14   Dg Abd Portable 1v  05/07/2015   CLINICAL DATA:  Orogastric tube position  EXAM: PORTABLE ABDOMEN - 1 VIEW  COMPARISON:  Chest radiograph same date  FINDINGS: Tip of orogastric tube terminates over the expected location of the third portion of the duodenum. Right upper quadrant clips are noted. Normal bowel gas pattern.  IMPRESSION: Feeding tube tip terminates over the expected location of the third portion of the duodenum.   Electronically Signed   By: Christiana Pellant M.D.   On: 05/07/2015 11:19     ASSESSMENT / PLAN: 36F with acute hypoxemic and hypercapnic respiratory failure 2/2 HCAP vs obstructive lung disease vs decompensated HFpEF from reduced preload likely due to decreased PO intake with resulting AKI.  PULMONARY A: acute on chronic hypoxemic/hypercapnic resp failure Suspect COPD exacerbation 2/2 HCAP P:   - Sputum cx - RVP pending - Ustrep and legionella ab pending - Vanc/zosyn as below - duoneb Q4h scheduled - allow PSV but defer extubation until MS a bit better - solumedrol scheduled   CARDIOVASCULAR A: Hypotention peri-intubation likely 2/2 reduced RV preload with intubation in conjunction with propofol and sepsis.  Elevated troponin likely Type II given sepsis and CHF exacerbation.   P:  - follow BP, lactate with resuscitation - no heparin gtt at this time.  RENAL A:  AKI on CKD, improving some 7/24.   Hypernatremia P: - cont volume resuscitation and follow BMP - No indication for emergent HD now, she would be a poor candidate for this given her overall health and her recent FTT - add free water 7/24  INFECTIOUS A:  Sepsis source likely HCAP P:   BCx2 05/07/2015 UC None Sputum 7/23/ Abx: Vanc zosyn, 7/23 >>   NEUROLOGIC A:  Likely septic encephalopathy with hypercapnic narcosis, medication effect in the setting or poor baseline.   P:   - RASS goal: 0 to -1 while on vent - follow MS for improvement with  correction metabolic status, attempt to minimize sedating meds,     FAMILY  - Updates: Will plan to meet with pt's daughters on 7/24   #) Limited Code.  No CPR.  Per daughter 7/23 would want trial of intubation with aggressive medical therapy x7days but then would move to comfort if patient is not improving.   Independent CC time 40 minutes   Levy Pupa, MD, PhD 05/08/2015, 10:44 AM [place] [facility] [phone] or if no answer [phone]

## 2015-05-08 NOTE — Progress Notes (Signed)
Dr. Delford Field notified of patient's 05/08/15 0000 CBG of 422. Ordered to give 20 units of insulin and changed coverage to Q4 hour sliding scale. 20 units of insulin given. Will continue to monitor.

## 2015-05-08 NOTE — Progress Notes (Signed)
ANTIBIOTIC CONSULT NOTE - FOLLOW UP  Pharmacy Consult for Vancomycin + Zosyn Indication: HCAP  No Known Allergies  Patient Measurements: Height: 5\' 1"  (154.9 cm) Weight: 190 lb 4.1 oz (86.3 kg) IBW/kg (Calculated) : 47.8  Vital Signs: Temp: 99.2 F (37.3 C) (07/24 0754) Temp Source: Oral (07/24 0754) BP: 143/54 mmHg (07/24 0736) Pulse Rate: 78 (07/24 0736) Intake/Output from previous day: 07/23 0701 - 07/24 0700 In: 2219.6 [I.V.:1280.8; NG/GT:638.8; IV Piggyback:300] Out: 1465 [Urine:1465] Intake/Output from this shift:    Labs:  Recent Labs  05/07/15 0257 05/07/15 1135 05/08/15 0232  WBC 10.0 8.1 7.0  HGB 11.5* 9.4* 9.1*  PLT 168 131* 120*  CREATININE 3.28* 2.99* 2.45*   Estimated Creatinine Clearance: 23.4 mL/min (by C-G formula based on Cr of 2.45). No results for input(s): VANCOTROUGH, VANCOPEAK, VANCORANDOM, GENTTROUGH, GENTPEAK, GENTRANDOM, TOBRATROUGH, TOBRAPEAK, TOBRARND, AMIKACINPEAK, AMIKACINTROU, AMIKACIN in the last 72 hours.   Microbiology: Recent Results (from the past 720 hour(s))  Blood culture (routine x 2)     Status: None   Collection Time: 04/08/15  4:00 PM  Result Value Ref Range Status   Specimen Description BLOOD LEFT HAND  Final   Special Requests BOTTLES DRAWN AEROBIC AND ANAEROBIC 5CC  Final   Culture  Setup Time   Final    GRAM POSITIVE COCCI IN CLUSTERS ANAEROBIC BOTTLE ONLY CRITICAL RESULT CALLED TO, READ BACK BY AND VERIFIED WITH: C. MILLER,RN AT [id number] [id number] BY S. YARBROUGH AEROBIC BOTTLE ONLY GRAM POSITIVE COCCI IN PAIRS IN CLUSTERS    Culture   Final    STAPHYLOCOCCUS SPECIES (COAGULASE NEGATIVE) THE SIGNIFICANCE OF ISOLATING THIS ORGANISM FROM A SINGLE SET OF BLOOD CULTURES WHEN MULTIPLE SETS ARE DRAWN IS UNCERTAIN. PLEASE NOTIFY THE MICROBIOLOGY DEPARTMENT WITHIN ONE WEEK IF SPECIATION AND SENSITIVITIES ARE REQUIRED.    Report Status 04/11/2015 FINAL  Final  Blood culture (routine x 2)     Status: None   Collection Time:  04/08/15  4:05 PM  Result Value Ref Range Status   Specimen Description BLOOD RIGHT HAND  Final   Special Requests BOTTLES DRAWN AEROBIC AND ANAEROBIC 5CC  Final   Culture NO GROWTH 5 DAYS  Final   Report Status 04/13/2015 FINAL  Final  Urine culture     Status: None   Collection Time: 04/08/15  9:24 PM  Result Value Ref Range Status   Specimen Description URINE, CATHETERIZED  Final   Special Requests NONE  Final   Culture   Final    MULTIPLE SPECIES PRESENT, SUGGEST RECOLLECTION IF CLINICALLY INDICATED   Report Status 04/10/2015 FINAL  Final  MRSA PCR Screening     Status: None   Collection Time: 05/07/15  8:16 AM  Result Value Ref Range Status   MRSA by PCR NEGATIVE NEGATIVE Final    Comment:        The GeneXpert MRSA Assay (FDA approved for NASAL specimens only), is one component of a comprehensive MRSA colonization surveillance program. It is not intended to diagnose MRSA infection nor to guide or monitor treatment for MRSA infections.     Anti-infectives    Start     Dose/Rate Route Frequency Ordered Stop   05/09/15 1000  vancomycin (VANCOCIN) IVPB 1000 mg/200 mL premix  Status:  Discontinued     1,000 mg 200 mL/hr over 60 Minutes Intravenous Every 48 hours 05/07/15 0857 05/08/15 0840   05/08/15 1400  piperacillin-tazobactam (ZOSYN) IVPB 3.375 g     3.375 g 12.5 mL/hr over 240 Minutes Intravenous  3 times per day 05/08/15 0840     05/08/15 1100  vancomycin (VANCOCIN) IVPB 1000 mg/200 mL premix     1,000 mg 200 mL/hr over 60 Minutes Intravenous Every 24 hours 05/08/15 0840     05/07/15 1200  piperacillin-tazobactam (ZOSYN) IVPB 2.25 g  Status:  Discontinued     2.25 g 100 mL/hr over 30 Minutes Intravenous Every 6 hours 05/07/15 0846 05/08/15 0840   05/07/15 0930  vancomycin (VANCOCIN) 500 mg in sodium chloride 0.9 % 100 mL IVPB     500 mg 100 mL/hr over 60 Minutes Intravenous  Once 05/07/15 0857 05/07/15 1121   05/07/15 0830  vancomycin (VANCOCIN) IVPB 750 mg/150 ml  premix  Status:  Discontinued     750 mg 150 mL/hr over 60 Minutes Intravenous Every 12 hours 05/07/15 0819 05/07/15 0844   05/07/15 0830  piperacillin-tazobactam (ZOSYN) IVPB 3.375 g  Status:  Discontinued     3.375 g 12.5 mL/hr over 240 Minutes Intravenous 3 times per day 05/07/15 0819 05/07/15 0843   05/07/15 0315  piperacillin-tazobactam (ZOSYN) IVPB 3.375 g     3.375 g 100 mL/hr over 30 Minutes Intravenous  Once 05/07/15 0312 05/07/15 0423   05/07/15 0315  vancomycin (VANCOCIN) IVPB 1000 mg/200 mL premix     1,000 mg 200 mL/hr over 60 Minutes Intravenous  Once 05/07/15 0312 05/07/15 0519      Assessment: 64 YOF with hx recurrent PNA who presented on 6/23 with respiratory failure and hypoxia requiring intubation. The patient remains on Vancomycin and Zosyn D#2 for empiric HCAP coverage. Tmax/24h: 99.8, WBC 7, SCr improved to 2.45, estimated CrCl~20-25 ml/min, UOP/24h: 0.7 ml/kg/hr. Will adjust both Vancomycin and Zosyn today due to improving renal function.  Vanc 7/23 >> Zosyn 7/23 >>  7/23 BCx >> 7/23 TA >> 7/23 RVP >> 7/23 MRSA PCR >> negative  Goal of Therapy:  Vancomycin trough level 15-20 mcg/ml  Proper antibiotics for infection/cultures adjusted for renal/hepatic function   Plan:  1. Adjust Vancomycin to 1g IV every 24 hours 2. Adjust Zosyn to 3.375g IV every 8 hours (infused over 4 hours) 3. Will continue to follow renal function, culture results, LOT, and antibiotic de-escalation plans   Georgina Pillion, PharmD, BCPS Clinical Pharmacist Pager: [phone] 05/08/2015 8:49 AM

## 2015-05-09 ENCOUNTER — Inpatient Hospital Stay (HOSPITAL_COMMUNITY): Payer: Medicare Other

## 2015-05-09 DIAGNOSIS — J9600 Acute respiratory failure, unspecified whether with hypoxia or hypercapnia: Secondary | ICD-10-CM

## 2015-05-09 DIAGNOSIS — R4182 Altered mental status, unspecified: Secondary | ICD-10-CM | POA: Diagnosis present

## 2015-05-09 DIAGNOSIS — R4020 Unspecified coma: Secondary | ICD-10-CM

## 2015-05-09 DIAGNOSIS — J9601 Acute respiratory failure with hypoxia: Secondary | ICD-10-CM | POA: Diagnosis present

## 2015-05-09 LAB — GLUCOSE, CAPILLARY
GLUCOSE-CAPILLARY: 152 mg/dL — AB (ref 65–99)
GLUCOSE-CAPILLARY: 191 mg/dL — AB (ref 65–99)
GLUCOSE-CAPILLARY: 288 mg/dL — AB (ref 65–99)
GLUCOSE-CAPILLARY: 311 mg/dL — AB (ref 65–99)
Glucose-Capillary: 291 mg/dL — ABNORMAL HIGH (ref 65–99)
Glucose-Capillary: 291 mg/dL — ABNORMAL HIGH (ref 65–99)
Glucose-Capillary: 295 mg/dL — ABNORMAL HIGH (ref 65–99)
Glucose-Capillary: 263 mg/dL — ABNORMAL HIGH (ref 65–99)
Glucose-Capillary: 225 mg/dL — ABNORMAL HIGH (ref 65–99)
Glucose-Capillary: 158 mg/dL — ABNORMAL HIGH (ref 65–99)
Glucose-Capillary: 195 mg/dL — ABNORMAL HIGH (ref 65–99)
Glucose-Capillary: 170 mg/dL — ABNORMAL HIGH (ref 65–99)
Glucose-Capillary: 180 mg/dL — ABNORMAL HIGH (ref 65–99)

## 2015-05-09 LAB — BASIC METABOLIC PANEL
Anion gap: 9 (ref 5–15)
BUN: 79 mg/dL — AB (ref 6–20)
BUN: 70 mg/dL — ABNORMAL HIGH (ref 6–20)
CALCIUM: 9.1 mg/dL (ref 8.9–10.3)
CO2: 25 mmol/L (ref 22–32)
CO2: 25 mmol/L (ref 22–32)
CREATININE: 1.67 mg/dL — AB (ref 0.44–1.00)
Calcium: 8.5 mg/dL — ABNORMAL LOW (ref 8.9–10.3)
Chloride: 122 mmol/L — ABNORMAL HIGH (ref 101–111)
Chloride: >130 mmol/L (ref 101–111)
Creatinine, Ser: 1.94 mg/dL — ABNORMAL HIGH (ref 0.44–1.00)
GFR calc Af Amer: 31 mL/min — ABNORMAL LOW (ref >60)
GFR calc non Af Amer: 26 mL/min — ABNORMAL LOW (ref >60)
GFR calc non Af Amer: 32 mL/min — ABNORMAL LOW (ref >60)
GFR, EST AFRICAN AMERICAN: 37 mL/min — AB (ref >60)
Glucose, Bld: 347 mg/dL — ABNORMAL HIGH (ref 65–99)
Glucose, Bld: 196 mg/dL — ABNORMAL HIGH (ref 65–99)
Potassium: 2.7 mmol/L — CL (ref 3.5–5.1)
Potassium: 3.4 mmol/L — ABNORMAL LOW (ref 3.5–5.1)
SODIUM: 156 mmol/L — AB (ref 135–145)
SODIUM: 165 mmol/L — AB (ref 135–145)

## 2015-05-09 LAB — MAGNESIUM: MAGNESIUM: 2.1 mg/dL (ref 1.7–2.4)

## 2015-05-09 LAB — LEGIONELLA ANTIGEN, URINE

## 2015-05-09 LAB — PHOSPHORUS: PHOSPHORUS: 3.1 mg/dL (ref 2.5–4.6)

## 2015-05-09 MED ORDER — METHYLPREDNISOLONE SODIUM SUCC 40 MG IJ SOLR
20.0000 mg | Freq: Every day | INTRAMUSCULAR | Status: AC
  Administered 2015-05-10: 20 mg via INTRAVENOUS
  Filled 2015-05-09: qty 0.5

## 2015-05-09 MED ORDER — SODIUM CHLORIDE 0.9 % IV SOLN
INTRAVENOUS | Status: DC
  Administered 2015-05-09: 2.3 [IU]/h via INTRAVENOUS
  Administered 2015-05-10: 6.2 [IU]/h via INTRAVENOUS
  Administered 2015-05-11: 3.3 [IU]/h via INTRAVENOUS
  Filled 2015-05-09 (×4): qty 2.5

## 2015-05-09 MED ORDER — INSULIN REGULAR BOLUS VIA INFUSION
0.0000 [IU] | Freq: Three times a day (TID) | INTRAVENOUS | Status: DC
  Filled 2015-05-09: qty 10

## 2015-05-09 MED ORDER — DEXTROSE-NACL 5-0.45 % IV SOLN
INTRAVENOUS | Status: DC
  Administered 2015-05-09: 12:00:00 via INTRAVENOUS

## 2015-05-09 MED ORDER — POTASSIUM CHLORIDE 20 MEQ/15ML (10%) PO SOLN
40.0000 meq | ORAL | Status: AC
  Administered 2015-05-09 (×2): 40 meq
  Filled 2015-05-09 (×2): qty 30

## 2015-05-09 MED ORDER — SODIUM CHLORIDE 0.9 % IV SOLN
INTRAVENOUS | Status: DC
  Administered 2015-05-09 – 2015-05-11 (×3): via INTRAVENOUS

## 2015-05-09 MED ORDER — COLLAGENASE 250 UNIT/GM EX OINT
TOPICAL_OINTMENT | Freq: Every day | CUTANEOUS | Status: DC
  Administered 2015-05-09 – 2015-05-18 (×7): via TOPICAL
  Administered 2015-05-19: 1 via TOPICAL
  Administered 2015-05-20 – 2015-05-22 (×2): via TOPICAL
  Filled 2015-05-09 (×3): qty 30

## 2015-05-09 MED ORDER — DEXTROSE 50 % IV SOLN: 25.0000 mL | INTRAVENOUS | Status: DC | PRN

## 2015-05-09 MED ORDER — DEXTROSE 5 % IV SOLN
INTRAVENOUS | Status: DC
  Administered 2015-05-09 – 2015-05-12 (×5): via INTRAVENOUS

## 2015-05-09 MED ORDER — CEFTRIAXONE SODIUM IN DEXTROSE 40 MG/ML IV SOLN
2.0000 g | INTRAVENOUS | Status: DC
  Administered 2015-05-09 – 2015-05-11 (×3): 2 g via INTRAVENOUS
  Filled 2015-05-09 (×5): qty 50

## 2015-05-09 MED ORDER — ATENOLOL 50 MG PO TABS
50.0000 mg | ORAL_TABLET | Freq: Two times a day (BID) | ORAL | Status: DC
  Administered 2015-05-09 – 2015-05-21 (×17): 50 mg via ORAL
  Filled 2015-05-09 (×21): qty 1

## 2015-05-09 NOTE — Progress Notes (Signed)
CRITICAL VALUE ALERT  Critical value received:  Na 165; Cl 130  Date of notification:  05/09/15  Time of notification:  1730  Critical value read back: yes  Nurse who received alert:  Leotis Shames cline  MD notified (1st page):  ELINK notified  Time of first page:  1800  MD notified (2nd page):  Time of second page:  Responding MD:  Pola Corn RN to pass on to MD  Time MD responded:  1800

## 2015-05-09 NOTE — Clinical Documentation Improvement (Signed)
05/08/15 Progr note.Marland KitchenMarland Kitchen"Elevated troponin likely Type II given sepsis and CHF exacerbation."..."P: - follow BP, lactate with resuscitation.".Marland KitchenMarland Kitchen  Last recent ECHO 04/09/25 per EMR, no BNP available  For accurate DX specificity & severity can noted " CHF exacerbation " be further specified with type/ acuity. Thank you  . Document acuity --Acute --Chronic --Acute on Chronic . Document type --Diastolic --Systolic --Combined systolic and diastolic . Due to or associated with --Cardiac or other surgery --Hypertension --Valvular disease --Rheumatic heart disease Endocarditis (valvitis) Pericarditis Myocarditis --Other (specify)  Supporting Information: See above note  Thank You,  Toribio Harbour, RN, BSN, CCDS Certified Clinical Documentation Specialist Pager: [phone] [place]: Health Information Management

## 2015-05-09 NOTE — Progress Notes (Signed)
Pt's FSBS running in 300's informed Dr. Darrick Penna. Orders for insulin drip

## 2015-05-09 NOTE — Progress Notes (Signed)
PULMONARY / CRITICAL CARE MEDICINE   Name: Valerie Rodriguez MRN: [id number] DOB: May 16, 1951    ADMISSION DATE:  05/07/15 CONSULTATION DATE:  05/09/15  REFERRING MD :  Dr. 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 Patient became less interactive and then was found unresponsive in her facility with O2 sats in 60-70s. Patient intubated.  Patient has a history of HTN, HLD, DM, stroke, CKD, PVD, and PAD who was in her usual state of health (communicative and interactive with sign language) until 2 months ago when she developed several bouts of PNA.  The most recent one 2 weeks ago for which she was treated but over the last two weeks her daughter states she has been minimally interactive (if at all).  7/23 she was at her facility and she was found un responsive with O2 sats in the 60-70s.  On arrival to the ED she was 80s on NRB mask.  She was then intubated for hypoxemia and found to have thick tenatcious secretions and peri-intubation hypotension that resolved with fluid.  Subjective / Interval Hx:  Tolerates PSV, now htn and off  VITAL SIGNS: Temp:  [97.6 F (36.4 C)-99.5 F (37.5 C)] 98.4 F (36.9 C) (07/25 1243) Pulse Rate:  [67-89] 73 (07/25 1246) Resp:  [12-24] 12 (07/25 1246) BP: (134-196)/(50-86) 157/73 mmHg (07/25 1246) SpO2:  [98 %-100 %] 100 % (07/25 1246) FiO2 (%):  [40 %] 40 % (07/25 1246) Weight:  [86.4 kg (190 lb 7.6 oz)] 86.4 kg (190 lb 7.6 oz) (07/25 0321) HEMODYNAMICS:   VENTILATOR SETTINGS: Vent Mode:  [-] PSV;CPAP FiO2 (%):  [40 %] 40 % Set Rate:  [18 bmp] 18 bmp Vt Set:  [310 mL] 310 mL PEEP:  [5 cmH20] 5 cmH20 Pressure Support:  [5 cmH20-8 cmH20] 5 cmH20 Plateau Pressure:  [14 cmH20-16 cmH20] 16 cmH20 INTAKE / OUTPUT:  Intake/Output Summary (Last 24 hours) at 05/09/15 1408 Last data filed at 05/09/15 1000  Gross per 24 hour   Intake 1483.45 ml  Output   1445 ml  Net  38.45 ml    PHYSICAL EXAMINATION: General:  Intubated and sedated. Neuro:  rass -1, not easy to follow commands HEENT:  Intubated, short neck Cardiovascular: s1 s 2 regular. No m/r/g Lungs: reduced Abdomen:  Soft non-distended non tender. Normal bowel sounds LE: no c/c/e  LABS:  CBC  Recent Labs Lab 05/07/15 0257 05/07/15 1135 05/08/15 0232  WBC 10.0 8.1 7.0  HGB 11.5* 9.4* 9.1*  HCT 37.3 30.4* 29.8*  PLT 168 131* 120*   Coag's  Recent Labs Lab 05/07/15 1135  INR 1.46   BMET  Recent Labs Lab 05/08/15 0232 05/08/15 1630 05/09/15 0211  NA 157* 159* 156*  K 3.6 3.8 2.7*  CL 120* 126* 122*  CO2 23 26 25   BUN 83* 76* 79*  CREATININE 2.45* 2.19* 1.94*  GLUCOSE 438* 359* 347*   Electrolytes  Recent Labs Lab 05/07/15 1135 05/08/15 0232 05/08/15 1630 05/09/15 0211  CALCIUM  --  8.3* 8.7* 8.5*  MG 2.2  --   --  2.1  PHOS 4.0  --   --  3.1   Sepsis Markers  Recent Labs Lab 05/07/15 0310 05/07/15 0644 05/07/15 1135  LATICACIDVEN 1.84 1.8  --   PROCALCITON  --   --  0.74   ABG  Recent Labs Lab 05/07/15 0517  PHART  7.337*  PCO2ART 46.4*  PO2ART 251.0*   Liver Enzymes  Recent Labs Lab 05/07/15 0257  AST 51*  ALT 18  ALKPHOS 61  BILITOT 0.7  ALBUMIN 2.6*   Cardiac Enzymes No results for input(s): TROPONINI, PROBNP in the last 168 hours. Glucose  Recent Labs Lab 05/08/15 2028 05/09/15 0016 05/09/15 0434 05/09/15 0546 05/09/15 0642 05/09/15 0742  GLUCAP 341* 311* 288* 291* 291* 295*    Imaging Dg Chest Port 1 View  05/09/2015   CLINICAL DATA:  Acute respiratory failure  EXAM: PORTABLE CHEST - 1 VIEW  COMPARISON:  05/07/2015  FINDINGS: Cardiac shadow is stable. Patient is somewhat rotated to the right. The overall inspiratory effort is poor. No focal confluent infiltrate is seen. An endotracheal tube and nasogastric catheter are again seen and stable. No acute bony abnormality is  noted.  IMPRESSION: Poor inspiratory effort without acute abnormality.   Electronically Signed   By: Alcide Clever M.D.   On: 05/09/2015 07:29     ASSESSMENT / PLAN: 64yo F with respiratory failure due to HCAP vs. COPD exacerbation vs. decompensated HF due to reduced preload, with resulting AKI  PULMONARY A:  Acute on chronic respiratory failure, likely secondary to COPD  HCAP P:   - RVP, low suspicion, on isolation - Duoneb Q4h scheduled - allow PSV but defer extubation until MS a bit better.ssessing rsbi - solumedrol reduction, moving air better, hyperglycemia - last abg reviewed, keep same mV  -will need to discuss reintubation status -sit upright -control afterload  CARDIOVASCULAR A:  Hypotension - resolved Hypertension     P:  - Continue metoprolol, to bid - Continue plavix, heparin  RENAL A:   Acute on chronic kidney disease - improving, (Cr 1.94), hypovolemia Hypernatremia (154) Hypokalemia (2.7) Hyperchloremia (122) P: - Cont volume resuscitation and follow BMP - Increase free water (on 300 ml TID) - K supplement -add d5w  GASTROINTESTINAL A: TF tolerated P: - PPI -maintain TF to goal  ENDOCRINE: A: Hyperglycemia T2 DM A:  - Insulin drip required -to transition as able when D%W off likely  INFECTIOUS A:   Sepsis source likely HCAP .as - lactate trended down (1.8 today) P:   BCx2 7/23 >> Sputum 7/23 >> moderate candida, mod yeast   Abx:  Vanc 7/23>>>7/25 zosyn, 7/23 >>7/25 Ceftriaxone start 7/25>>>  NEUROLOGIC A:  Likely septic encephalopathy with hypercapnic narcosis, medication effect in the setting of poor baseline.   P:   - RASS goal: 0 - follow MS for improvement with correction metabolic status, attempt to minimize sedating meds,  -avoid benzo as able, high risk delirium    FAMILY  - Updates: Patient's daughter to be updated     Ccm time 30 min   Mcarthur Rossetti. Tyson Alias, MD, FACP Pgr: [phone] [facility]

## 2015-05-09 NOTE — Progress Notes (Signed)
eLink Physician-Brief Progress Note Patient Name: Valerie Rodriguez DOB: 25-Oct-1950 MRN: [id number]   Date of Service  05/09/2015  HPI/Events of Note  Blood sugars persistently greater than 300 despite lantus started on 7/23 and resistant SSI.  Patient on TFs and steroids.  eICU Interventions  Plan: Start insulin gtt.  Once blood sugars improved transition to lantus and SSI     Intervention Category Intermediate Interventions: Hyperglycemia - evaluation and treatment  DETERDING,ELIZABETH 05/09/2015, 3:40 AM

## 2015-05-09 NOTE — Progress Notes (Signed)
eLink Physician-Brief Progress Note Patient Name: Valerie Rodriguez DOB: 01/20/1951 MRN: [id number]   Date of Service  05/09/2015  HPI/Events of Note  Hypokalemia  eICU Interventions  Potassium replaced     Intervention Category Intermediate Interventions: Electrolyte abnormality - evaluation and management  DETERDING,ELIZABETH 05/09/2015, 3:32 AM

## 2015-05-09 NOTE — Clinical Social Work Note (Signed)
FL-2 completed and clinicals faxed to [facility]) for review. Patient to return to [facility] once medically stable.   FL-2 to be placed on chart for MD signature.   Derenda Fennel, MSW, LCSWA [phone] 05/09/2015 2:12 PM

## 2015-05-09 NOTE — Progress Notes (Signed)
CRITICAL VALUE ALERT  Critical value received:  Potassium 2.7  Date of notification:  7/216  Time of notification:  0330  Critical value read back:Yes  Nurse who received alert: Byrce Shepard/Maydell Knoebel, RN  MD notified (1st page): Dr. Darrick Penna  Time of first page: 0330

## 2015-05-09 NOTE — Progress Notes (Signed)
Family at [facility]. Family refusing to wear mask and isolation gowns. Educated on need to wear and reasons to wear. One family member says that she is off duty and doesn't have to wear isolation gowns. This family member is [employer] employee.

## 2015-05-09 NOTE — Consult Note (Signed)
WOC wound consult note Reason for Consult: pressure ulcers Pt is deaf, family that is able to sign is at the bedside to explain that I am consulted to look at the pressure ulcers on her body.  Wound type: Stage II Pressure Ulcer left heel Stage III Pressure Ulcer right heel Unstageable Pressure Ulcer sacrum/gluteal crease MASD (moisture associated skin damage) over the bilateral buttocks, patient incontinent of stool  Pressure Ulcer POA: Yes  Measurement: Left heel: 2cm x 2cm x 0 Right heel: 2.5cm x 3.0cm x 0.2cm  Sacrum 4cm x 2.5cm x 0.2cm   Wound bed: Sacrum: 95% yellow, 5% pink at wound edges Left heel: intact, serous filled blister Right heel: 50 % yellow/50% pink   Drainage (amount, consistency, odor) minimal at each site serosanguinous , none from the left heel. No ordor Periwound: intact at heels, however several small areas of partial thickness skin loss over the bilateral buttocks, skin damage from incontinences  Dressing procedure/placement/frequency: Add enzymatic debridement ointment to the right heel and sacrum daily.  Continue soft silicone foam to the left heel. Add Prevalon boots bilaterally for offloading of the heels.  On a Sport bed while in the ICU.  Discussed POC with patient and bedside nurse.  Re consult if needed, will not follow at this time. Thanks  Gelena Klosinski [employer], CWOCN [phone])

## 2015-05-09 NOTE — Progress Notes (Signed)
eLink Physician-Brief Progress Note Patient Name: Valerie Rodriguez DOB: 10-24-1950 MRN: [id number]   Date of Service  05/09/2015  HPI/Events of Note  Na 165  eICU Interventions  Increase d5W to 100/h     Intervention Category Intermediate Interventions: Electrolyte abnormality - evaluation and management  ALVA,RAKESH V. 05/09/2015, 6:05 PM

## 2015-05-09 NOTE — Clinical Social Work Note (Addendum)
Clinical Social Work Assessment  Patient Details  Name: Valerie Rodriguez MRN: [id number] Date of Birth: May 29, 1951  Date of referral:  05/09/15               Reason for consult:  Discharge Planning                Permission sought to share information with:  Case Manager, Facility Medical sales representative, Family Supports, PCP Permission granted to share information::  Yes, Verbal Permission Granted  Name::      [name] Geographical information systems officer )  Agency::   [facility] )  Relationship::   (Daughter )  Contact Information:   [phone])  Housing/Transportation Living arrangements for the past 2 months:  Skilled Nursing Facility Source of Information:  Adult Children Patient Interpreter Needed:  None Criminal Activity/Legal Involvement Pertinent to Current Situation/Hospitalization:  No - Comment as needed Significant Relationships:  Adult Children, Other Family Members Lives with:  Facility Resident Do you feel safe going back to the place where you live?  Yes Need for family participation in patient care:  No (Coment)  Care giving concerns:  No care giving concerns reported by family at this time.    Social Worker assessment / plan:  Visual merchandiser spoke with patient's dtr, Geographical information systems officer telephone in reference to post-acute placement for SNF. CSW introduced CSW role and SNF process. Patient's dtr reported patient has been a resident of [facility]) since April of 2014. CSW has also contacted facility to confirmed that pt is a long-term resident of [facility]. Pt's dtr reported she is pleased with the care at [facility] and wishes for pt to return at discharge. Pt is currently intubated on ventilator at 40%. No further concerns reported by family at this time. CSW encouraged pt's dtr to contact CSW with further questions/concerns. CSW to update FL-2 and fax clinicals to [facility] for review. CSW will continue to follow pt and pt's family for continued support and to facilitate  pt's discharge needs once medically stable.   Employment status:  Retired Database administrator PT Recommendations:  Not assessed at this time Information / Referral to community resources:  Skilled Nursing Facility  Patient/Family's Response to care:  Patient intubated. Pt's dtr agreeable to pt returning to [facility] once medically stable. Pt's dtr pleasant and appreciated social work intervention.   Patient/Family's Understanding of and Emotional Response to Diagnosis, Current Treatment, and Prognosis:  Pt's dtr knowledgeable of pt being intubated, diagnosis and current on-going 7 day treatment. Pt's dtr plans to continue meeting with MD for next stages of care.   Emotional Assessment Appearance:  Appears stated age Attitude/Demeanor/Rapport:  Unable to Assess Affect (typically observed):  Unable to Assess Orientation:  Oriented to Self Alcohol / Substance use:  Not Applicable Psych involvement (Current and /or in the community):  No (Comment)  Discharge Needs  Concerns to be addressed:  No discharge needs identified Readmission within the last 30 days:  No Current discharge risk:  None Barriers to Discharge:  No Barriers Identified   Derenda Fennel, MSW, LCSWA [phone] 05/09/2015 1:51 PM

## 2015-05-09 NOTE — Clinical Documentation Improvement (Signed)
05/08/15 Progr note..."RENAL A: AKI on CKD, improving some 7/24. Hypernatremia P:- cont volume resuscitation and follow BMP- No indication for emergent HD now, she would be a poor candidate for this given her overall health and her recent FTT- add free water 7/24.".Marland KitchenMarland Kitchen  Ref. Range 05/08/2015 16:30 05/08/2015 20:28 05/09/2015 00:16 05/09/2015 02:11 05/09/2015 04:34 05/09/2015 05:33 05/09/2015 05:46 05/09/2015 06:42  EGFR (Non-African Amer.) Latest Ref Range: >60 mL/min 23 (L)   26 (L)      For accurate Dx specificity & severity can noted "CKD" be further specified with stage. Thank you   Possible Clinical Conditions?  _______CKD Stage I - GFR > OR = 90 _______CKD Stage II - GFR 60-80 _______CKD Stage III - GFR 30-59 _______CKD Stage IV - GFR 15-29 _______CKD Stage V - GFR < 15 _______ESRD (End Stage Renal Disease) _______Other condition_____________ _______Cannot Clinically determine   Supporting Information: see above note  Thank You, Ermelinda Das, RN, BSN, CCDS Certified Clinical Documentation Specialist Pager: [place]: Health Information Management

## 2015-05-10 ENCOUNTER — Inpatient Hospital Stay (HOSPITAL_COMMUNITY): Payer: Medicare Other

## 2015-05-10 DIAGNOSIS — R40243 Glasgow coma scale score 3-8: Secondary | ICD-10-CM

## 2015-05-10 LAB — CULTURE, RESPIRATORY

## 2015-05-10 LAB — BASIC METABOLIC PANEL
Anion gap: 8 (ref 5–15)
Anion gap: 7 (ref 5–15)
Anion gap: 6 (ref 5–15)
BUN: 66 mg/dL — ABNORMAL HIGH (ref 6–20)
BUN: 63 mg/dL — ABNORMAL HIGH (ref 6–20)
BUN: 62 mg/dL — ABNORMAL HIGH (ref 6–20)
CALCIUM: 8.4 mg/dL — AB (ref 8.9–10.3)
CO2: 26 mmol/L (ref 22–32)
CO2: 25 mmol/L (ref 22–32)
CO2: 22 mmol/L (ref 22–32)
CREATININE: 1.47 mg/dL — AB (ref 0.44–1.00)
CREATININE: 1.5 mg/dL — AB (ref 0.44–1.00)
Calcium: 8.8 mg/dL — ABNORMAL LOW (ref 8.9–10.3)
Calcium: 8.4 mg/dL — ABNORMAL LOW (ref 8.9–10.3)
Chloride: 126 mmol/L — ABNORMAL HIGH (ref 101–111)
Chloride: 122 mmol/L — ABNORMAL HIGH (ref 101–111)
Chloride: 126 mmol/L — ABNORMAL HIGH (ref 101–111)
Creatinine, Ser: 1.31 mg/dL — ABNORMAL HIGH (ref 0.44–1.00)
GFR calc Af Amer: 42 mL/min — ABNORMAL LOW (ref >60)
GFR calc Af Amer: 43 mL/min — ABNORMAL LOW (ref >60)
GFR calc Af Amer: 49 mL/min — ABNORMAL LOW (ref >60)
GFR calc non Af Amer: 42 mL/min — ABNORMAL LOW (ref >60)
GFR, EST NON AFRICAN AMERICAN: 36 mL/min — AB (ref >60)
GFR, EST NON AFRICAN AMERICAN: 37 mL/min — AB (ref >60)
GLUCOSE: 171 mg/dL — AB (ref 65–99)
GLUCOSE: 187 mg/dL — AB (ref 65–99)
GLUCOSE: 188 mg/dL — AB (ref 65–99)
Potassium: 2.9 mmol/L — ABNORMAL LOW (ref 3.5–5.1)
Potassium: 2.4 mmol/L — CL (ref 3.5–5.1)
Potassium: 5 mmol/L (ref 3.5–5.1)
SODIUM: 160 mmol/L — AB (ref 135–145)
Sodium: 154 mmol/L — ABNORMAL HIGH (ref 135–145)
Sodium: 154 mmol/L — ABNORMAL HIGH (ref 135–145)

## 2015-05-10 LAB — GLUCOSE, CAPILLARY
GLUCOSE-CAPILLARY: 174 mg/dL — AB (ref 65–99)
GLUCOSE-CAPILLARY: 173 mg/dL — AB (ref 65–99)
GLUCOSE-CAPILLARY: 167 mg/dL — AB (ref 65–99)
GLUCOSE-CAPILLARY: 149 mg/dL — AB (ref 65–99)
GLUCOSE-CAPILLARY: 145 mg/dL — AB (ref 65–99)
GLUCOSE-CAPILLARY: 152 mg/dL — AB (ref 65–99)
GLUCOSE-CAPILLARY: 165 mg/dL — AB (ref 65–99)
GLUCOSE-CAPILLARY: 178 mg/dL — AB (ref 65–99)
GLUCOSE-CAPILLARY: 168 mg/dL — AB (ref 65–99)
Glucose-Capillary: 156 mg/dL — ABNORMAL HIGH (ref 65–99)
Glucose-Capillary: 159 mg/dL — ABNORMAL HIGH (ref 65–99)
Glucose-Capillary: 159 mg/dL — ABNORMAL HIGH (ref 65–99)
Glucose-Capillary: 170 mg/dL — ABNORMAL HIGH (ref 65–99)
Glucose-Capillary: 173 mg/dL — ABNORMAL HIGH (ref 65–99)
Glucose-Capillary: 174 mg/dL — ABNORMAL HIGH (ref 65–99)
Glucose-Capillary: 175 mg/dL — ABNORMAL HIGH (ref 65–99)
Glucose-Capillary: 179 mg/dL — ABNORMAL HIGH (ref 65–99)
Glucose-Capillary: 143 mg/dL — ABNORMAL HIGH (ref 65–99)
Glucose-Capillary: 143 mg/dL — ABNORMAL HIGH (ref 65–99)
Glucose-Capillary: 145 mg/dL — ABNORMAL HIGH (ref 65–99)
Glucose-Capillary: 161 mg/dL — ABNORMAL HIGH (ref 65–99)
Glucose-Capillary: 149 mg/dL — ABNORMAL HIGH (ref 65–99)
Glucose-Capillary: 161 mg/dL — ABNORMAL HIGH (ref 65–99)
Glucose-Capillary: 182 mg/dL — ABNORMAL HIGH (ref 65–99)
Glucose-Capillary: 169 mg/dL — ABNORMAL HIGH (ref 65–99)

## 2015-05-10 LAB — CBC
HCT: 28.9 % — ABNORMAL LOW (ref 36.0–46.0)
Hemoglobin: 8.9 g/dL — ABNORMAL LOW (ref 12.0–15.0)
MCH: 25.7 pg — ABNORMAL LOW (ref 26.0–34.0)
MCHC: 30.8 g/dL (ref 30.0–36.0)
MCV: 83.5 fL (ref 78.0–100.0)
Platelets: 121 10*3/uL — ABNORMAL LOW (ref 150–400)
RBC: 3.46 MIL/uL — ABNORMAL LOW (ref 3.87–5.11)
RDW: 17.8 % — AB (ref 11.5–15.5)
WBC: 7.9 10*3/uL (ref 4.0–10.5)

## 2015-05-10 LAB — CULTURE, RESPIRATORY W GRAM STAIN

## 2015-05-10 MED ORDER — POTASSIUM CHLORIDE 20 MEQ/15ML (10%) PO SOLN
40.0000 meq | ORAL | Status: AC
  Administered 2015-05-10 (×3): 40 meq
  Filled 2015-05-10 (×3): qty 30

## 2015-05-10 NOTE — Progress Notes (Signed)
eLink Physician-Brief Progress Note Patient Name: Valerie Rodriguez DOB: [birth year] MRN: [id number]   Date of Service  05/10/2015  HPI/Events of Note  Low K, High Na  eICU Interventions  KCL via tube  Decrease D5W to slow rate of Na correction     Intervention Category Major Interventions: Acute renal failure - evaluation and management;Other:  Phyillis Dascoli 05/10/2015, 6:41 AM

## 2015-05-10 NOTE — Progress Notes (Signed)
Pts daughter [name] at [facility]. [name] is upset about change in Code status to Full DNR. She states that other family dont have the authority to change code status.  RN notes there is no [employer] available on chart.  [name] and her children also continue to refuse to wear Blue isolation gowns and masks when visiting patient.

## 2015-05-10 NOTE — Progress Notes (Signed)
PULMONARY / CRITICAL CARE MEDICINE   Name: Valerie Rodriguez MRN: [id number] DOB: 09/29/1951    ADMISSION DATE:  05/07/15 CONSULTATION DATE:  05/09/15  REFERRING MD :  Dr. 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 Patient became less interactive and then was found unresponsive in her facility with O2 sats in 60-70s. Patient intubated.  Patient has a history of HTN, HLD, DM, stroke, CKD, PVD, and PAD who was in her usual state of health (communicative and interactive with sign language) until 2 months ago when she developed several bouts of PNA.  The most recent one 2 weeks ago for which she was treated but over the last two weeks her daughter states she has been minimally interactive (if at all).  7/23 she was at her facility and she was found un responsive with O2 sats in the 60-70s.  On arrival to the ED she was 80s on NRB mask.  She was then intubated for hypoxemia and found to have thick tenatcious secretions and peri-intubation hypotension that resolved with fluid.  Subjective / Interval Hx:  Patient awake but not responding to questions. Resting comfortably on vent.  VITAL SIGNS: Temp:  [98 F (36.7 C)-99.9 F (37.7 C)] 99.5 F (37.5 C) (07/26 1206) Pulse Rate:  [65-87] 76 (07/26 1202) Resp:  [13-22] 13 (07/26 1202) BP: (110-180)/(51-77) 120/59 mmHg (07/26 1100) SpO2:  [98 %-100 %] 100 % (07/26 1202) FiO2 (%):  [40 %] 40 % (07/26 1202) Weight:  [197 lb 8.5 oz (89.6 kg)] 197 lb 8.5 oz (89.6 kg) (07/26 0404) HEMODYNAMICS:   VENTILATOR SETTINGS: Vent Mode:  [-] PSV;CPAP FiO2 (%):  [40 %] 40 % Set Rate:  [18 bmp] 18 bmp Vt Set:  [310 mL] 310 mL PEEP:  [5 cmH20] 5 cmH20 Pressure Support:  [5 cmH20] 5 cmH20 INTAKE / OUTPUT:  Intake/Output Summary (Last 24 hours) at 05/10/15 1517 Last data filed at 05/10/15 1300  Gross per 24 hour  Intake 4640.16 ml   Output   2285 ml  Net 2355.16 ml    PHYSICAL EXAMINATION: General:  Intubated and sedated. Awake, eyes open, does not respond to questions Neuro:  rass -2, does not follow commands HEENT:  Intubated, short neck Cardiovascular: s1 s 2 regular. No murmur Lungs: reduced, tolerating vent well.  Abdomen:  Soft, non tender. Very distended. Normal bowel sounds LE: no edema, warm.  LABS:  CBC  Recent Labs Lab 05/07/15 1135 05/08/15 0232 05/10/15 0525  WBC 8.1 7.0 7.9  HGB 9.4* 9.1* 8.9*  HCT 30.4* 29.8* 28.9*  PLT 131* 120* 121*   Coag's  Recent Labs Lab 05/07/15 1135  INR 1.46   BMET  Recent Labs Lab 05/09/15 1353 05/10/15 0046 05/10/15 0525  NA 165* 160* 154*  K 3.4* 2.9* 2.4*  CL >130* 126* 122*  CO2 25 26 25   BUN 70* 66* 63*  CREATININE 1.67* 1.50* 1.47*  GLUCOSE 196* 187* 171*   Electrolytes  Recent Labs Lab 05/07/15 1135  05/09/15 0211 05/09/15 1353 05/10/15 0046 05/10/15 0525  CALCIUM  --   < > 8.5* 9.1 8.8* 8.4*  MG 2.2  --  2.1  --   --   --   PHOS 4.0  --  3.1  --   --   --   < > = values in this interval not displayed. Sepsis Markers  Recent  Labs Lab 05/07/15 0310 05/07/15 0644 05/07/15 1135  LATICACIDVEN 1.84 1.8  --   PROCALCITON  --   --  0.74   ABG  Recent Labs Lab 05/07/15 0517  PHART 7.337*  PCO2ART 46.4*  PO2ART 251.0*   Liver Enzymes  Recent Labs Lab 05/07/15 0257  AST 51*  ALT 18  ALKPHOS 61  BILITOT 0.7  ALBUMIN 2.6*   Cardiac Enzymes No results for input(s): TROPONINI, PROBNP in the last 168 hours. Glucose  Recent Labs Lab 05/10/15 0500 05/10/15 0608 05/10/15 0647 05/10/15 0802 05/10/15 0859 05/10/15 1002  GLUCAP 167* 143* 143* 145* 149* 161*    Imaging Dg Abd Portable 1v  05/09/2015   CLINICAL DATA:  Ileus. Abdominal distention. Nasogastric tube placement  EXAM: PORTABLE ABDOMEN - 1 VIEW  COMPARISON:  05/07/2015  FINDINGS: A nasogastric tube remains in place with tip again seen overlying the  expected location of the transverse duodenum. Generalized gaseous distention of colon is again demonstrated, consistent with mild ileus. No dilated small bowel loops demonstrated.  IMPRESSION: Nasogastric tube tip again seen overlying the expected location of the transverse duodenum.  Persistent colonic distention, suspicious for ileus.   Electronically Signed   By: Myles Rosenthal M.D.   On: 05/09/2015 15:33     ASSESSMENT / PLAN: 64yo F with respiratory failure due to HCAP vs. COPD exacerbation vs. decompensated HF due to reduced preload, with resulting AKI  PULMONARY A:  Acute on chronic respiratory failure, likely secondary to COPD  HCAP P:   - RVP, pending - Duoneb Q4h scheduled - PSV; consider extubation - solumedrol reduction, moving air better, hyperglycemia -will need to discuss reintubation status -sit upright -control afterload  CARDIOVASCULAR A:  Hypotension - resolved Hypertension - resolved P:  - Continue metoprolol, to bid - Continue plavix, heparin  RENAL A:   Acute on chronic kidney disease - improving, (Cr 1.47) Hypovolemia Hypernatremia (154) Hypokalemia (2.4) Hyperchloremia (122) P: - Cont volume resuscitation and follow BMP - free water (on 300 ml TID) - replace K -d5w maintain, bmet q12h  GASTROINTESTINAL A: TF tolerated P: - PPI -maintain TF to goal  ENDOCRINE: A: Hyperglycemia T2 DM A:  - Insulin drip required - Transition when off D5W  INFECTIOUS A:   Sepsis source likely HCAP .as - lactate trended down (1.8 today) P:   BCx2 7/23 >> Sputum 7/23 >> moderate candida, mod yeast   Abx:  Vanc 7/23>>>7/25 zosyn, 7/23 >>7/25 Ceftriaxone  7/25>>>  NEUROLOGIC A:  Likely septic encephalopathy with hypercapnic narcosis, medication effect in the setting of poor baseline.   P:   - RASS goal: 0 - follow MS for improvement with correction metabolic status, attempt to minimize sedating meds,  -avoid benzo as able, high risk delirium     FAMILY  - Updates: Patient's daughter to be updated      Kathee Delton, MD,MS,  PGY2 05/10/2015 3:30 PM  STAFF NOTE: Cindi Carbon, MD FACP have personally reviewed patient's available data, including medical history, events of note, physical examination and test results as part of my evaluation. I have discussed with resident/NP and other care providers such as pharmacist, RN and RRT. In addition, I personally evaluated patient and elicited key findings of: less rhonchi, more awake, weaning well, cpap 5 ps 5, goal 2 hr, assess rsbi, remains culture neg, yeast not a pathogen, continue ceftraixone, pcxr for lung volumes, will update med poa, discuss reintubation status, replace K , continued free water, follow NA bmet  q12h The patient is critically ill with multiple organ systems failure and requires high complexity decision making for assessment and support, frequent evaluation and titration of therapies, application of advanced monitoring technologies and extensive interpretation of multiple databases.   Critical Care Time devoted to patient care services described in this note is 30 Minutes. This time reflects time of care of this signee: Rory Percy, MD FACP. This critical care time does not reflect procedure time, or teaching time or supervisory time of PA/NP/Med student/Med Resident etc but could involve care discussion time. Rest per NP/medical resident whose note is outlined above and that I agree with   Mcarthur Rossetti. Tyson Alias, MD, FACP Pgr: [phone] [facility] 05/10/2015 4:09 PM

## 2015-05-11 DIAGNOSIS — R401 Stupor: Secondary | ICD-10-CM

## 2015-05-11 LAB — GLUCOSE, CAPILLARY
GLUCOSE-CAPILLARY: 129 mg/dL — AB (ref 65–99)
GLUCOSE-CAPILLARY: 134 mg/dL — AB (ref 65–99)
GLUCOSE-CAPILLARY: 136 mg/dL — AB (ref 65–99)
GLUCOSE-CAPILLARY: 139 mg/dL — AB (ref 65–99)
GLUCOSE-CAPILLARY: 145 mg/dL — AB (ref 65–99)
GLUCOSE-CAPILLARY: 146 mg/dL — AB (ref 65–99)
GLUCOSE-CAPILLARY: 155 mg/dL — AB (ref 65–99)
GLUCOSE-CAPILLARY: 157 mg/dL — AB (ref 65–99)
GLUCOSE-CAPILLARY: 168 mg/dL — AB (ref 65–99)
GLUCOSE-CAPILLARY: 171 mg/dL — AB (ref 65–99)
GLUCOSE-CAPILLARY: 171 mg/dL — AB (ref 65–99)
GLUCOSE-CAPILLARY: 177 mg/dL — AB (ref 65–99)
GLUCOSE-CAPILLARY: 182 mg/dL — AB (ref 65–99)
GLUCOSE-CAPILLARY: 197 mg/dL — AB (ref 65–99)
Glucose-Capillary: 159 mg/dL — ABNORMAL HIGH (ref 65–99)
Glucose-Capillary: 152 mg/dL — ABNORMAL HIGH (ref 65–99)
Glucose-Capillary: 153 mg/dL — ABNORMAL HIGH (ref 65–99)
Glucose-Capillary: 148 mg/dL — ABNORMAL HIGH (ref 65–99)
Glucose-Capillary: 141 mg/dL — ABNORMAL HIGH (ref 65–99)
Glucose-Capillary: 156 mg/dL — ABNORMAL HIGH (ref 65–99)
Glucose-Capillary: 134 mg/dL — ABNORMAL HIGH (ref 65–99)
Glucose-Capillary: 155 mg/dL — ABNORMAL HIGH (ref 65–99)
Glucose-Capillary: 155 mg/dL — ABNORMAL HIGH (ref 65–99)
Glucose-Capillary: 193 mg/dL — ABNORMAL HIGH (ref 65–99)

## 2015-05-11 LAB — BASIC METABOLIC PANEL
ANION GAP: 9 (ref 5–15)
Anion gap: 4 — ABNORMAL LOW (ref 5–15)
BUN: 59 mg/dL — ABNORMAL HIGH (ref 6–20)
BUN: 56 mg/dL — ABNORMAL HIGH (ref 6–20)
CALCIUM: 8.5 mg/dL — AB (ref 8.9–10.3)
CO2: 24 mmol/L (ref 22–32)
CO2: 20 mmol/L — ABNORMAL LOW (ref 22–32)
CREATININE: 1.22 mg/dL — AB (ref 0.44–1.00)
Calcium: 8.3 mg/dL — ABNORMAL LOW (ref 8.9–10.3)
Chloride: 126 mmol/L — ABNORMAL HIGH (ref 101–111)
Chloride: 121 mmol/L — ABNORMAL HIGH (ref 101–111)
Creatinine, Ser: 1.1 mg/dL — ABNORMAL HIGH (ref 0.44–1.00)
GFR calc Af Amer: 53 mL/min — ABNORMAL LOW (ref >60)
GFR calc non Af Amer: 52 mL/min — ABNORMAL LOW (ref >60)
GFR, EST NON AFRICAN AMERICAN: 46 mL/min — AB (ref >60)
GLUCOSE: 167 mg/dL — AB (ref 65–99)
Glucose, Bld: 154 mg/dL — ABNORMAL HIGH (ref 65–99)
POTASSIUM: 4 mmol/L (ref 3.5–5.1)
Potassium: 3.8 mmol/L (ref 3.5–5.1)
Sodium: 154 mmol/L — ABNORMAL HIGH (ref 135–145)
Sodium: 150 mmol/L — ABNORMAL HIGH (ref 135–145)

## 2015-05-11 LAB — RESPIRATORY VIRUS PANEL
Adenovirus: NEGATIVE
INFLUENZA A: NEGATIVE
INFLUENZA B 1: NEGATIVE
METAPNEUMOVIRUS: NEGATIVE
PARAINFLUENZA 3 A: NEGATIVE
Parainfluenza 1: NEGATIVE
Parainfluenza 2: NEGATIVE
RESPIRATORY SYNCYTIAL VIRUS A: NEGATIVE
Respiratory Syncytial Virus B: NEGATIVE
Rhinovirus: NEGATIVE

## 2015-05-11 MED ORDER — IPRATROPIUM-ALBUTEROL 0.5-2.5 (3) MG/3ML IN SOLN: 3.0000 mL | RESPIRATORY_TRACT | Status: DC | PRN

## 2015-05-11 MED ORDER — LABETALOL HCL 5 MG/ML IV SOLN
10.0000 mg | INTRAVENOUS | Status: DC | PRN
  Administered 2015-05-11: 10 mg via INTRAVENOUS
  Filled 2015-05-11 (×2): qty 4

## 2015-05-11 MED ORDER — HYDRALAZINE HCL 25 MG PO TABS
25.0000 mg | ORAL_TABLET | Freq: Three times a day (TID) | ORAL | Status: DC
  Administered 2015-05-11 – 2015-05-21 (×23): 25 mg via ORAL
  Filled 2015-05-11 (×28): qty 1

## 2015-05-11 MED ORDER — INSULIN ASPART 100 UNIT/ML ~~LOC~~ SOLN
0.0000 [IU] | SUBCUTANEOUS | Status: DC
  Administered 2015-05-11 – 2015-05-12 (×5): 4 [IU] via SUBCUTANEOUS
  Administered 2015-05-12 (×2): 7 [IU] via SUBCUTANEOUS
  Administered 2015-05-13: 4 [IU] via SUBCUTANEOUS
  Administered 2015-05-13: 3 [IU] via SUBCUTANEOUS
  Administered 2015-05-13: 7 [IU] via SUBCUTANEOUS
  Administered 2015-05-13 (×3): 4 [IU] via SUBCUTANEOUS
  Administered 2015-05-13: 7 [IU] via SUBCUTANEOUS
  Administered 2015-05-14 (×2): 4 [IU] via SUBCUTANEOUS
  Administered 2015-05-14: 7 [IU] via SUBCUTANEOUS
  Administered 2015-05-14 (×2): 4 [IU] via SUBCUTANEOUS
  Administered 2015-05-15: 15 [IU] via SUBCUTANEOUS
  Administered 2015-05-15 (×2): 11 [IU] via SUBCUTANEOUS
  Administered 2015-05-15: 4 [IU] via SUBCUTANEOUS
  Administered 2015-05-15: 7 [IU] via SUBCUTANEOUS
  Administered 2015-05-15: 4 [IU] via SUBCUTANEOUS
  Administered 2015-05-16: 3 [IU] via SUBCUTANEOUS
  Administered 2015-05-16: 4 [IU] via SUBCUTANEOUS
  Administered 2015-05-16: 7 [IU] via SUBCUTANEOUS
  Administered 2015-05-16 – 2015-05-17 (×8): 4 [IU] via SUBCUTANEOUS
  Administered 2015-05-18: 7 [IU] via SUBCUTANEOUS
  Administered 2015-05-18 (×2): 4 [IU] via SUBCUTANEOUS
  Administered 2015-05-18: 7 [IU] via SUBCUTANEOUS
  Administered 2015-05-18: 4 [IU] via SUBCUTANEOUS
  Administered 2015-05-18: 7 [IU] via SUBCUTANEOUS
  Administered 2015-05-19: 3 [IU] via SUBCUTANEOUS
  Administered 2015-05-19: 7 [IU] via SUBCUTANEOUS
  Administered 2015-05-19 – 2015-05-20 (×4): 3 [IU] via SUBCUTANEOUS
  Administered 2015-05-20: 4 [IU] via SUBCUTANEOUS
  Administered 2015-05-20 – 2015-05-21 (×2): 3 [IU] via SUBCUTANEOUS
  Administered 2015-05-21: 4 [IU] via SUBCUTANEOUS
  Filled 2015-05-11: qty 1

## 2015-05-11 MED ORDER — INSULIN GLARGINE 100 UNIT/ML ~~LOC~~ SOLN
20.0000 [IU] | SUBCUTANEOUS | Status: DC
  Filled 2015-05-11 (×2): qty 0.2

## 2015-05-11 MED ORDER — HYDRALAZINE HCL 20 MG/ML IJ SOLN
10.0000 mg | INTRAMUSCULAR | Status: DC | PRN
  Administered 2015-05-11: 10 mg via INTRAVENOUS
  Filled 2015-05-11: qty 1

## 2015-05-11 MED ORDER — INSULIN GLARGINE 100 UNIT/ML ~~LOC~~ SOLN
20.0000 [IU] | Freq: Every day | SUBCUTANEOUS | Status: DC
  Administered 2015-05-11 – 2015-05-22 (×11): 20 [IU] via SUBCUTANEOUS
  Filled 2015-05-11 (×13): qty 0.2

## 2015-05-11 NOTE — Progress Notes (Signed)
eLink Physician-Brief Progress Note Patient Name: Valerie Rodriguez DOB: 29-Sep-1951 MRN: [id number]   Date of Service  05/11/2015  HPI/Events of Note  Hypertension. BP = 187/97. Patient has PO antihypertensive meds ordered. However, she is drooling, was extubated today and bedside nurse is concerned about her ability to swallow.   eICU Interventions  Will order: 1. Hold PO medications. 2. Hydralazine IV PRN. 3. Labetalol IV PRN     Intervention Category Major Interventions: Hypertension - evaluation and management  Sommer,Steven Eugene 05/11/2015, 9:31 PM

## 2015-05-11 NOTE — Care Management Note (Signed)
Case Management Note  Patient Details  Name: Valerie Rodriguez MRN: [id number] Date of Birth: 1951-06-17  Subjective/Objective:    Talked with three daughters, Mare Loan and Minki.  All verifiy that she came from a SNF and would like her to go back depending on progression.  I had received an order for Ltach and found that for her insurance to consider this level would need to be trached and remain on vent.  Prior to admission her patient was to go to [facility] for a procedure so may have come from that.                  Action/Plan:   Expected Discharge Date:  05/13/15               Expected Discharge Plan:  Skilled Nursing Facility  In-House Referral:  Clinical Social Work  Discharge planning Services  CM Consult  Post Acute Care Choice:    Choice offered to:     DME Arranged:    DME Agency:     HH Arranged:    HH Agency:     Status of Service:  In process, will continue to follow  Medicare Important Message Given:    Date Medicare IM Given:    Medicare IM give by:    Date Additional Medicare IM Given:    Additional Medicare Important Message give by:     If discussed at Long Length of Stay Meetings, dates discussed:    Additional Comments:  Vangie Bicker, RN 05/11/2015, 1:16 PM

## 2015-05-11 NOTE — Progress Notes (Signed)
Pt admitted to the unit. Pt is stable, alert per baseline. Bed in lowest position, call bell within reach- will continue to monitor.

## 2015-05-11 NOTE — Procedures (Signed)
Extubation Procedure Note  Patient Details:   Name: Valerie Rodriguez DOB: [birth year] MRN: [id number]   Airway Documentation:     Evaluation  O2 sats: stable throughout Complications: No apparent complications Patient did tolerate procedure well. Bilateral Breath Sounds: Rhonchi Suctioning: Airway Yes   Patient extubated to 2L nasal cannula per MD order.  Positive cuff leak noted.  No evidence of stridor.  Sats currently 99%.  Vitals are stable.  No apparent complications.    Durwin Glaze 05/11/2015, 11:47 AM

## 2015-05-11 NOTE — Progress Notes (Addendum)
PULMONARY / CRITICAL CARE MEDICINE   Name: Valerie Rodriguez MRN: [id number] DOB: 1951/02/29    ADMISSION DATE:  05/07/15 CONSULTATION DATE:  05/09/15  REFERRING MD :  Dr. 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 Patient became less interactive and then was found unresponsive in her facility with O2 sats in 60-70s. Patient intubated.  Patient has a history of HTN, HLD, DM, stroke, CKD, PVD, and PAD who was in her usual state of health (communicative and interactive with sign language) until 2 months ago when she developed several bouts of PNA.  The most recent one 2 weeks ago for which she was treated but over the last two weeks her daughter states she has been minimally interactive (if at all).  7/23 she was at her facility and she was found un responsive with O2 sats in the 60-70s.  On arrival to the ED she was 80s on NRB mask.  She was then intubated for hypoxemia and found to have thick tenatcious secretions and peri-intubation hypotension that resolved with fluid.  Subjective / Interval Hx:  Weaned well Patient wakes w/ ease. Makes good eye contact and tracks but does not respond to questions. 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 Fluid balance almost even yesterday (+11ml)  VITAL SIGNS: Temp:  [98.5 F (36.9 C)-99.9 F (37.7 C)] 98.5 F (36.9 C) (07/26 1939) Pulse Rate:  [65-87] 73 (07/27 0500) Resp:  [10-21] 17 (07/27 0500) BP: (110-175)/(54-85) 157/66 mmHg (07/27 0500) SpO2:  [98 %-100 %] 100 % (07/27 0500) FiO2 (%):  [40 %] 40 % (07/27 0500) Weight:  [192 lb 10.9 oz (87.4 kg)] 192 lb 10.9 oz (87.4 kg) (07/27 0401) HEMODYNAMICS:   VENTILATOR SETTINGS: Vent Mode:  [-] PRVC FiO2 (%):  [40 %] 40 % Set Rate:  [18 bmp] 18 bmp Vt Set:  [310 mL] 310 mL PEEP:  [5 cmH20] 5 cmH20 Pressure Support:  [5 cmH20] 5  cmH20 INTAKE / OUTPUT:  Intake/Output Summary (Last 24 hours) at 05/11/15 0540 Last data filed at 05/11/15 0500  Gross per 24 hour  Intake   3592 ml  Output   3520 ml  Net     72 ml    PHYSICAL EXAMINATION: General:  Intubated and sedated, eyes open--tracking, does not respond to questions, more awake Neuro:  rass -1, does not follow commands HEENT:  Intubated, short neck Cardiovascular: RRR. No murmur Lungs: reduced, tolerating vent well. Relatively clear bliaterally Abdomen:  Soft, non tender. Very distended. +bowel sounds LE: no edema, warm.  LABS:  CBC  Recent Labs Lab 05/07/15 1135 05/08/15 0232 05/10/15 0525  WBC 8.1 7.0 7.9  HGB 9.4* 9.1* 8.9*  HCT 30.4* 29.8* 28.9*  PLT 131* 120* 121*   Coag's  Recent Labs Lab 05/07/15 1135  INR 1.46   BMET  Recent Labs Lab 05/10/15 0525 05/10/15 1843 05/11/15 0255  NA 154* 154* 154*  K 2.4* 5.0 3.8  CL 122* 126* 126*  CO2 25 22 24   BUN 63* 62* 59*  CREATININE 1.47* 1.31* 1.22*  GLUCOSE 171* 188* 167*   Electrolytes  Recent Labs Lab 05/07/15 1135  05/09/15 0211  05/10/15 0525 05/10/15 1843 05/11/15 0255  CALCIUM  --   < > 8.5*  < > 8.4* 8.4* 8.5*  MG 2.2  --  2.1  --   --   --   --  PHOS 4.0  --  3.1  --   --   --   --   < > = values in this interval not displayed. Sepsis Markers  Recent Labs Lab 05/07/15 0310 05/07/15 0644 05/07/15 1135  LATICACIDVEN 1.84 1.8  --   PROCALCITON  --   --  0.74   ABG  Recent Labs Lab 05/07/15 0517  PHART 7.337*  PCO2ART 46.4*  PO2ART 251.0*   Liver Enzymes  Recent Labs Lab 05/07/15 0257  AST 51*  ALT 18  ALKPHOS 61  BILITOT 0.7  ALBUMIN 2.6*   Cardiac Enzymes No results for input(s): TROPONINI, PROBNP in the last 168 hours. Glucose  Recent Labs Lab 05/10/15 1403 05/10/15 1510 05/10/15 1556 05/10/15 1706 05/10/15 1811 05/10/15 1921  GLUCAP 165* 178* 161* 182* 169* 168*    Imaging Dg Chest Port 1 View  05/10/2015   CLINICAL DATA:   Check endotracheal tube position  EXAM: PORTABLE CHEST - 1 VIEW  COMPARISON:  Radiograph 05/09/2015  FINDINGS: Endotrachea tube is 4 cm from carina. NG tube extends into the stomach. Interval improvement in aeration lung bases. Central venous congestion. No pneumothorax.  IMPRESSION: 1. Endotracheal tube appears in good position. 2. Increased aeration to lung bases.   Electronically Signed   By: Genevive Bi M.D.   On: 05/10/2015 16:48     ASSESSMENT / PLAN: 64yo F with respiratory failure due to HCAP vs. COPD exacerbation vs. decompensated HF due to reduced preload, with resulting AKI  PULMONARY A:  Acute on chronic respiratory failure, likely secondary to COPD  HCAP P:   - RVP, pending - Duoneb Q4h scheduled - PSV; consider extubation after repeat weaning cpa 5ps 5, goal 30 min  -await final decision from daughter son reintubation status - Wean sedation - DC steroids; low threshold to restart -control afterload, as BP has risen  CARDIOVASCULAR A:  Hypotension - resolved Hypertension P:  - DC metoprolol - HR in 70s - Consider restart home dose ACEi (may wait while renal function normalizes) vs home Hydralazine - Continue plavix, heparin  RENAL A:   Acute on chronic kidney disease - improving, (Cr 1.22) Hypovolemia Hypernatremia (154) Hypokalemia (3.8) - resolved Hyperchloremia (126) P: - Cont volume resuscitation and follow BMP - free water (on 300 ml TID) - replace K as needed -d5w maintain but increase to 100, bmet q12h  GASTROINTESTINAL A: TF tolerated P: - PPI -maintain TF to goal, hold for weaning  ENDOCRINE: A: Hyperglycemia T2 DM A:  - Insulin drip required given d5 rate - Transition when off D5W  INFECTIOUS A:   Sepsis source likely HCAP as - lactate trended down P:   BCx2 7/23 >> NGTD Sputum 7/23 >> moderate candida, mod yeast   Abx:  Vanc 7/23>>>7/25 zosyn, 7/23 >>7/25 Ceftriaxone  7/25>>> ensure stop date  NEUROLOGIC A:  Likely  septic encephalopathy with hypercapnic narcosis, medication effect in the setting of poor baseline.   P:   - RASS goal: 0 -avoid benzo as able, high risk delirium  -attempting communication   FAMILY  - Updates: Patient's daughter to be updated by DF 7./26. Await final decision reintubation status   Kathee Delton, MD,MS,  PGY2 05/11/2015 5:40 AM  .STAFF NOTE: I, Rory Percy, MD FACP have personally reviewed patient's available data, including medical history, events of note, physical examination and test results as part of my evaluation. I have discussed with resident/NP and other care providers such as pharmacist, RN and RRT. In addition,  I personally evaluated patient and elicited key findings of: remains awake, lungs improved, I had discussions both daughters, await dni decision, repeat weaning cpap5 ps5, goal 30 min this am , increase d5w to 100 cc/hr, follow up NA in pm, add home hydralazine as BP rising, control afterload, ceftriaxone to stop date, extubation will take place today , just need plan on reintubation , upright as able with small lung volumes The patient is critically ill with multiple organ systems failure and requires high complexity decision making for assessment and support, frequent evaluation and titration of therapies, application of advanced monitoring technologies and extensive interpretation of multiple databases.   Critical Care Time devoted to patient care services described in this note is30 Minutes. This time reflects time of care of this signee: Rory Percy, MD FACP. This critical care time does not reflect procedure time, or teaching time or supervisory time of PA/NP/Med student/Med Resident etc but could involve care discussion time. Rest per NP/medical resident whose note is outlined above and that I agree with   Mcarthur Rossetti. Tyson Alias, MD, FACP Pgr: [phone] [facility] 05/11/2015 7:39 AM    No reintubation decided,  daughters  Mcarthur Rossetti. Tyson Alias, MD, FACP Pgr: [phone] [facility]

## 2015-05-11 NOTE — Progress Notes (Signed)
Glucostabilizer d/c'ed- last CBG 168.

## 2015-05-11 NOTE — Progress Notes (Signed)
Spoke with MD tonight concerning patient's b/p- told to try hydralazine first and if that does not work then to try the labetalol.

## 2015-05-12 DIAGNOSIS — Z66 Do not resuscitate: Secondary | ICD-10-CM

## 2015-05-12 DIAGNOSIS — Z515 Encounter for palliative care: Secondary | ICD-10-CM

## 2015-05-12 LAB — CULTURE, BLOOD (ROUTINE X 2)
Culture: NO GROWTH
Culture: NO GROWTH

## 2015-05-12 LAB — GLUCOSE, CAPILLARY
GLUCOSE-CAPILLARY: 184 mg/dL — AB (ref 65–99)
GLUCOSE-CAPILLARY: 189 mg/dL — AB (ref 65–99)
GLUCOSE-CAPILLARY: 191 mg/dL — AB (ref 65–99)
Glucose-Capillary: 156 mg/dL — ABNORMAL HIGH (ref 65–99)
Glucose-Capillary: 176 mg/dL — ABNORMAL HIGH (ref 65–99)
Glucose-Capillary: 189 mg/dL — ABNORMAL HIGH (ref 65–99)
Glucose-Capillary: 207 mg/dL — ABNORMAL HIGH (ref 65–99)
Glucose-Capillary: 211 mg/dL — ABNORMAL HIGH (ref 65–99)

## 2015-05-12 LAB — PROTIME-INR
INR: 1.12 (ref 0.00–1.49)
Prothrombin Time: 14.6 seconds (ref 11.6–15.2)

## 2015-05-12 LAB — BASIC METABOLIC PANEL
ANION GAP: 7 (ref 5–15)
BUN: 38 mg/dL — AB (ref 6–20)
CALCIUM: 8.5 mg/dL — AB (ref 8.9–10.3)
CHLORIDE: 119 mmol/L — AB (ref 101–111)
CO2: 22 mmol/L (ref 22–32)
CREATININE: 1.03 mg/dL — AB (ref 0.44–1.00)
GFR calc Af Amer: >60 mL/min (ref >60)
GFR calc non Af Amer: 57 mL/min — ABNORMAL LOW (ref >60)
GLUCOSE: 221 mg/dL — AB (ref 65–99)
Potassium: 3.3 mmol/L — ABNORMAL LOW (ref 3.5–5.1)
SODIUM: 148 mmol/L — AB (ref 135–145)

## 2015-05-12 MED ORDER — HYDRALAZINE HCL 20 MG/ML IJ SOLN: 10.0000 mg | INTRAMUSCULAR | Status: DC | PRN

## 2015-05-12 MED ORDER — SODIUM CHLORIDE 0.9 % IV SOLN
1.5000 g | Freq: Four times a day (QID) | INTRAVENOUS | Status: DC
  Administered 2015-05-12 – 2015-05-17 (×20): 1.5 g via INTRAVENOUS
  Filled 2015-05-12 (×22): qty 1.5

## 2015-05-12 MED ORDER — DEXTROSE 5 % IV SOLN
INTRAVENOUS | Status: AC
  Administered 2015-05-12 – 2015-05-13 (×3): via INTRAVENOUS

## 2015-05-12 NOTE — Progress Notes (Signed)
Patient Demographics:    Valerie Rodriguez, is a 64 y.o. female, DOB - 10/07/1951, ZOX:[id number]  Admit date - 05/07/2015   Admitting Physician Leslye Peer, MD  Outpatient Primary MD for the patient is No PCP Per Patient  LOS - 5   Chief Complaint  Patient presents with  . Altered Mental Status  . 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 Patient became less interactive and then was found unresponsive in her facility with O2 sats in 60-70s. Patient intubated.  Patient has a history of HTN, HLD, DM, stroke, CKD, PVD, and PAD who was in her usual state of health (communicative and interactive with sign language) until 2 months ago when she developed several bouts of PNA. The most recent one 2 weeks ago for which she was treated but over the last two weeks her daughter states she has been minimally interactive (if at all). 7/23 she was at her facility and she was found un responsive with O2 sats in the 60-70s. On arrival to the ED she was 80s on NRB mask. She was then intubated for hypoxemia and found to have thick tenatcious secretions and peri-intubation hypotension that resolved with fluid.  She was intubated and kept in ICU by pulmonary critical care team, she was extubated on 05/11/2015 in transfer to hospitalist service under my care on 05/12/2015. She is still minimally responsive with open her eyes to physical stimuli but will not respond to any commands. Continues to have severe generalized weakness. Continues to appear to be very high risk for aspiration.     Subjective:    [name] today in bed, nonverbal, makes eye contact only, does not appear to be in any distress.   Assessment  & Plan :     1. Recurrent acute on chronic  hypoxic respiratory failure due to COPD exacerbation along with recurrent aspiration pneumonia-HCAP. Patient has had several bouts of recent pneumonia, this admission she required endotracheal intubation. She has extremely poor baseline quality of life with bedbound status, baseline deaf and mute, and now evidence of high risk for recurrent aspiration.  She has finished her steroids, she is now on nasal cannula oxygen after extubation, will stop all antivirals and leave her only on Unasyn on 05/12/2015 Will treat for a total duration of 7 days as clinically much improved, continue nebulizer treatments, currently nothing by mouth Will have speech therapy evaluate. Have requested daughter to look into long-term goals of care in a patient with poor quality of life. We'll request palliative care input to help family decide.   2. Severe dehydration with acute renal failure and hypernatremia. Improving with D5W for hydration continue, monitor BMP. Renal function back to baseline.   3. Generalized weakness, deaf-mute. Extremely poor baseline quality of life. What really bedbound with minimal responsiveness per daughter at baseline. PT, palliative care for goals of care.   4. Risk of aspiration. Currently nothing by mouth except medications, speech to evaluate.   5. Hypertension. Oral medications if able to tolerate, as needed IV hydralazine.   6. DM type II. Currently on sliding scale along with Lantus continue.   Lab Results  Component Value Date   HGBA1C 9.4* 04/09/2015    CBG (last 3)   Recent Labs  05/12/15 0028 05/12/15 0441 05/12/15 0800  GLUCAP 156* 207* 176*      Code Status : DO NOT RESUSCITATE  Family Communication  : Daughter over the phone  Disposition Plan  : SNF in a few days  Consults  :  Pulmonary critical care, palliative care  Procedures  :   Intubation upon admission and extubated 05/11/2015.  TTE done one month ago shows EF of 60% with chronic grade 1  compensated diastolic heart failure. No wall motion abnormality.    DVT Prophylaxis  :  Heparin   Lab Results  Component Value Date   PLT 121* 05/10/2015    Inpatient Medications  Scheduled Meds: . antiseptic oral rinse  7 mL Mouth Rinse QID  . atenolol  50 mg Oral BID  . cefTRIAXone (ROCEPHIN)  IV  2 g Intravenous Q24H  . chlorhexidine  15 mL Mouth Rinse BID  . citalopram  20 mg Oral Daily  . clopidogrel  75 mg Oral Daily  . collagenase   Topical Daily  . heparin  5,000 Units Subcutaneous 3 times per day  . hydrALAZINE  25 mg Oral 3 times per day  . insulin aspart  0-20 Units Subcutaneous 6 times per day  . insulin glargine  20 Units Subcutaneous Daily  . latanoprost  1 drop Both Eyes QHS  . pregabalin  75 mg Oral BID  . saccharomyces boulardii  250 mg Oral BID   Continuous Infusions: . dextrose 125 mL/hr at 05/12/15 0810   PRN Meds:.sodium chloride, dextrose, hydrALAZINE, ipratropium-albuterol, labetalol  Antibiotics  :     Anti-infectives    Start     Dose/Rate Route Frequency Ordered Stop   05/09/15 1530  cefTRIAXone (ROCEPHIN) 2 g in dextrose 5 % 50 mL IVPB - Premix     2 g 100 mL/hr over 30 Minutes Intravenous Every 24 hours 05/09/15 1447 05/16/15 1529   05/09/15 1000  vancomycin (VANCOCIN) IVPB 1000 mg/200 mL premix  Status:  Discontinued     1,000 mg 200 mL/hr over 60 Minutes Intravenous Every 48 hours 05/07/15 0857 05/08/15 0840   05/08/15 1400  piperacillin-tazobactam (ZOSYN) IVPB 3.375 g  Status:  Discontinued     3.375 g 12.5 mL/hr over 240 Minutes Intravenous 3 times per day 05/08/15 0840 05/09/15 1446   05/08/15 1100  vancomycin (VANCOCIN) IVPB 1000 mg/200 mL premix  Status:  Discontinued     1,000 mg 200 mL/hr over 60 Minutes Intravenous Every 24 hours 05/08/15 0840 05/09/15 1446   05/07/15 1200  piperacillin-tazobactam (ZOSYN) IVPB 2.25 g  Status:  Discontinued     2.25 g 100 mL/hr over 30 Minutes Intravenous Every 6 hours 05/07/15 0846 05/08/15  0840   05/07/15 0930  vancomycin (VANCOCIN) 500 mg in sodium chloride 0.9 % 100 mL IVPB     500 mg 100 mL/hr over 60 Minutes Intravenous  Once 05/07/15 0857 05/07/15 1121   05/07/15 0830  vancomycin (VANCOCIN) IVPB 750 mg/150 ml premix  Status:  Discontinued     750 mg 150 mL/hr over 60 Minutes Intravenous Every 12 hours 05/07/15 0819 05/07/15 0844   05/07/15 0830  piperacillin-tazobactam (ZOSYN) IVPB 3.375 g  Status:  Discontinued     3.375 g 12.5 mL/hr over 240 Minutes Intravenous 3 times per day 05/07/15 0819 05/07/15 0843   05/07/15 0315  piperacillin-tazobactam (ZOSYN) IVPB 3.375 g  3.375 g 100 mL/hr over 30 Minutes Intravenous  Once 05/07/15 0312 05/07/15 0423   05/07/15 0315  vancomycin (VANCOCIN) IVPB 1000 mg/200 mL premix     1,000 mg 200 mL/hr over 60 Minutes Intravenous  Once 05/07/15 0312 05/07/15 0519        Objective:   Filed Vitals:   05/11/15 2149 05/11/15 2254 05/11/15 2349 05/12/15 0527  BP: 171/78 172/66 126/60 141/53  Pulse: 59 70  67  Temp: 98.2 F (36.8 C)   98.2 F (36.8 C)  TempSrc: Oral   Oral  Resp: 18   18  Height:      Weight:      SpO2:  100%  100%    Wt Readings from Last 3 Encounters:  05/11/15 87.4 kg (192 lb 10.9 oz)  04/12/15 89 kg (196 lb 3.4 oz)  03/26/15 98.2 kg (216 lb 7.9 oz)     Intake/Output Summary (Last 24 hours) at 05/12/15 1115 Last data filed at 05/12/15 0933  Gross per 24 hour  Intake  958.8 ml  Output   1050 ml  Net  -91.2 ml     Physical Exam  Awake , makes eye contact only, moves upper extremity to painful stimuli minimally. [place].AT,PERRAL Supple Neck,No JVD, No cervical lymphadenopathy appriciated.  Symmetrical Chest wall movement, Good air movement bilaterally, CTAB RRR,No Gallops,Rubs or new Murmurs, No Parasternal Heave +ve B.Sounds, Abd Soft, No tenderness, No organomegaly appriciated, No rebound - guarding or rigidity. No Cyanosis, Clubbing or edema, No new Rash or bruise    Wearing splints in both  legs    Data Review:   Micro Results Recent Results (from the past 240 hour(s))  Culture, blood (routine x 2)     Status: None (Preliminary result)   Collection Time: 05/07/15  2:57 AM  Result Value Ref Range Status   Specimen Description BLOOD LEFT HAND  Final   Special Requests BOTTLES DRAWN AEROBIC AND ANAEROBIC 5CC  Final   Culture NO GROWTH 4 DAYS  Final   Report Status PENDING  Incomplete  Culture, blood (routine x 2)     Status: None (Preliminary result)   Collection Time: 05/07/15  3:02 AM  Result Value Ref Range Status   Specimen Description BLOOD RIGHT HAND  Final   Special Requests BOTTLES DRAWN AEROBIC AND ANAEROBIC 5CC  Final   Culture NO GROWTH 4 DAYS  Final   Report Status PENDING  Incomplete  MRSA PCR Screening     Status: None   Collection Time: 05/07/15  8:16 AM  Result Value Ref Range Status   MRSA by PCR NEGATIVE NEGATIVE Final    Comment:        The GeneXpert MRSA Assay (FDA approved for NASAL specimens only), is one component of a comprehensive MRSA colonization surveillance program. It is not intended to diagnose MRSA infection nor to guide or monitor treatment for MRSA infections.   Culture, respiratory (tracheal aspirate)     Status: None   Collection Time: 05/07/15  9:27 AM  Result Value Ref Range Status   Specimen Description TRACHEAL ASPIRATE  Final   Special Requests NONE  Final   Gram Stain   Final    FEW WBC PRESENT, PREDOMINANTLY MONONUCLEAR RARE SQUAMOUS EPITHELIAL CELLS PRESENT MODERATE YEAST Performed at [facility]    Culture   Final    MODERATE YEAST CONSISTENT WITH CANDIDA SPECIES Performed at [facility]    Report Status 05/10/2015 FINAL  Final  Respiratory virus panel  Status: None   Collection Time: 05/07/15 10:56 AM  Result Value Ref Range Status   Source - RVPAN NASOPHARYNGEAL  Corrected   Respiratory Syncytial Virus A Negative Negative Final   Respiratory Syncytial Virus B Negative Negative Final    Influenza A Negative Negative Final   Influenza B Negative Negative Final   Parainfluenza 1 Negative Negative Final   Parainfluenza 2 Negative Negative Final   Parainfluenza 3 Negative Negative Final   Metapneumovirus Negative Negative Final   Rhinovirus Negative Negative Final   Adenovirus Negative Negative Final    Comment: (NOTE) Performed At: [facility] [address] [place], [place] [id number] Mila Homer MD WU:[id number]     Radiology Reports Dg Chest Port 1 View  05/10/2015   CLINICAL DATA:  Check endotracheal tube position  EXAM: PORTABLE CHEST - 1 VIEW  COMPARISON:  Radiograph 05/09/2015  FINDINGS: Endotrachea tube is 4 cm from carina. NG tube extends into the stomach. Interval improvement in aeration lung bases. Central venous congestion. No pneumothorax.  IMPRESSION: 1. Endotracheal tube appears in good position. 2. Increased aeration to lung bases.   Electronically Signed   By: Genevive Bi M.D.   On: 05/10/2015 16:48   Dg Chest Port 1 View  05/09/2015   CLINICAL DATA:  Acute respiratory failure  EXAM: PORTABLE CHEST - 1 VIEW  COMPARISON:  05/07/2015  FINDINGS: Cardiac shadow is stable. Patient is somewhat rotated to the right. The overall inspiratory effort is poor. No focal confluent infiltrate is seen. An endotracheal tube and nasogastric catheter are again seen and stable. No acute bony abnormality is noted.  IMPRESSION: Poor inspiratory effort without acute abnormality.   Electronically Signed   By: Alcide Clever M.D.   On: 05/09/2015 07:29   Dg Chest Port 1 View  05/07/2015   CLINICAL DATA:  Recent endotracheal readjustment  EXAM: PORTABLE CHEST - 1 VIEW  COMPARISON:  04/27/2015  FINDINGS: The endotracheal tube now lies approximately 2 cm above the carina. A nasogastric catheter is seen within the stomach. Cardiac shadow is stable. Persistent right basilar atelectasis is noted. No other focal abnormality is noted.  IMPRESSION: Stable right basilar  atelectasis.  The endotracheal tube as described.   Electronically Signed   By: Alcide Clever M.D.   On: 05/07/2015 11:14   Dg Chest Port 1 View  05/07/2015   CLINICAL DATA:  Check endotracheal tube placement 05/07/2015  EXAM: PORTABLE CHEST - 1 VIEW  COMPARISON:  None.  FINDINGS: Endotracheal tube is again noted 12 mm above the carina and should be withdrawn 2 cm. A nasogastric catheter is noted within the stomach although the proximal side port lies in the distal esophagus. Elevation of the right hemidiaphragm with right basilar atelectasis is noted. The cardiac shadow is stable.  IMPRESSION: Endotracheal tube still 12 mm above the carina. This should be withdrawn approximately 2 cm.  Nasogastric catheter as described.  Increasing right basilar atelectasis.   Electronically Signed   By: Alcide Clever M.D.   On: 05/07/2015 09:06   Dg Chest Port 1 View  05/07/2015   CLINICAL DATA:  Endotracheal tube placement.  Initial encounter.  EXAM: PORTABLE CHEST - 1 VIEW  COMPARISON:  Chest radiograph performed earlier today at 3:01 a.m.  FINDINGS: The patient's endotracheal tube is noted extending just below the carina, overlying the right mainstem bronchus. This could be retracted approximately 3 cm. The enteric tube is noted extending below the diaphragm, with the side port noted about the distal  esophagus. This could be advanced 8 cm, as deemed clinically appropriate.  The lungs are hypoexpanded. Right perihilar airspace opacity may reflect atelectasis, with mild right-sided volume loss noted. No pleural effusion or pneumothorax is seen.  The cardiomediastinal silhouette is borderline normal in size. No acute osseous abnormalities identified.  IMPRESSION: 1. Endotracheal tube seen ending just below the carina, overlying the right mainstem bronchus. This could be retracted approximately 3 cm. 2. Enteric tube seen extending below the diaphragm, with the side port about the distal esophagus. This could be advanced 8 cm,  is deemed clinically appropriate. 3. Lungs hypoexpanded. Right perihilar airspace opacity may reflect atelectasis, with mild right-sided volume loss noted. These results were called by telephone at the time of interpretation on 05/07/2015 at 5:48 am to Dr. Loren Racer, who verbally acknowledged these results.   Electronically Signed   By: Roanna Raider M.D.   On: 05/07/2015 05:48   Dg Chest Port 1 View  05/07/2015   CLINICAL DATA:  Altered mental status, acute onset. Initial encounter.  EXAM: PORTABLE CHEST - 1 VIEW  COMPARISON:  Chest radiograph performed 04/08/2015  FINDINGS: The lungs remain hypoexpanded. Left basilar airspace opacity may reflect atelectasis or possibly mild pneumonia. No definite pleural effusion or pneumothorax is seen.  The cardiomediastinal silhouette is enlarged. No acute osseous abnormalities are identified.  IMPRESSION: Lungs remain hypoexpanded. Left basilar airspace opacity may reflect atelectasis or possibly mild pneumonia. Cardiomegaly noted.   Electronically Signed   By: Roanna Raider M.D.   On: 05/07/2015 03:44   Dg Abd Portable 1v  05/09/2015   CLINICAL DATA:  Ileus. Abdominal distention. Nasogastric tube placement  EXAM: PORTABLE ABDOMEN - 1 VIEW  COMPARISON:  05/07/2015  FINDINGS: A nasogastric tube remains in place with tip again seen overlying the expected location of the transverse duodenum. Generalized gaseous distention of colon is again demonstrated, consistent with mild ileus. No dilated small bowel loops demonstrated.  IMPRESSION: Nasogastric tube tip again seen overlying the expected location of the transverse duodenum.  Persistent colonic distention, suspicious for ileus.   Electronically Signed   By: Myles Rosenthal M.D.   On: 05/09/2015 15:33   Dg Abd Portable 1v  05/07/2015   CLINICAL DATA:  Orogastric tube position  EXAM: PORTABLE ABDOMEN - 1 VIEW  COMPARISON:  Chest radiograph same date  FINDINGS: Tip of orogastric tube terminates over the expected  location of the third portion of the duodenum. Right upper quadrant clips are noted. Normal bowel gas pattern.  IMPRESSION: Feeding tube tip terminates over the expected location of the third portion of the duodenum.   Electronically Signed   By: Christiana Pellant M.D.   On: 05/07/2015 11:19     CBC  Recent Labs Lab 05/07/15 0257 05/07/15 1135 05/08/15 0232 05/10/15 0525  WBC 10.0 8.1 7.0 7.9  HGB 11.5* 9.4* 9.1* 8.9*  HCT 37.3 30.4* 29.8* 28.9*  PLT 168 131* 120* 121*  MCV 83.6 83.5 83.9 83.5  MCH 25.8* 25.8* 25.6* 25.7*  MCHC 30.8 30.9 30.5 30.8  RDW 17.6* 17.4* 17.2* 17.8*  LYMPHSABS 2.1  --   --   --   MONOABS 1.1*  --   --   --   EOSABS 0.0  --   --   --   BASOSABS 0.0  --   --   --     Chemistries   Recent Labs Lab 05/07/15 0257 05/07/15 1135  05/09/15 0211  05/10/15 0525 05/10/15 1843 05/11/15 0255 05/11/15 1523 05/12/15  0541  NA 154*  --   < > 156*  < > 154* 154* 154* 150* 148*  K 4.8  --   < > 2.7*  < > 2.4* 5.0 3.8 4.0 3.3*  CL 115*  --   < > 122*  < > 122* 126* 126* 121* 119*  CO2 25  --   < > 25  < > 20* 22  GLUCOSE 388*  --   < > 347*  < > 171* 188* 167* 154* 221*  BUN 89*  --   < > 79*  < > 63* 62* 59* 56* 38*  CREATININE 3.28* 2.99*  < > 1.94*  < > 1.47* 1.31* 1.22* 1.10* 1.03*  CALCIUM 8.9  --   < > 8.5*  < > 8.4* 8.4* 8.5* 8.3* 8.5*  MG  --  2.2  --  2.1  --   --   --   --   --   --   AST 51*  --   --   --   --   --   --   --   --   --   ALT 18  --   --   --   --   --   --   --   --   --   ALKPHOS 61  --   --   --   --   --   --   --   --   --   BILITOT 0.7  --   --   --   --   --   --   --   --   --   < > = values in this interval not displayed. ------------------------------------------------------------------------------------------------------------------ estimated creatinine clearance is 56.1 mL/min (by C-G formula based on Cr of  1.03). ------------------------------------------------------------------------------------------------------------------ No results for input(s): HGBA1C in the last 72 hours. ------------------------------------------------------------------------------------------------------------------ No results for input(s): CHOL, HDL, LDLCALC, TRIG, CHOLHDL, LDLDIRECT in the last 72 hours. ------------------------------------------------------------------------------------------------------------------ No results for input(s): TSH, T4TOTAL, T3FREE, THYROIDAB in the last 72 hours.  Invalid input(s): FREET3 ------------------------------------------------------------------------------------------------------------------ No results for input(s): VITAMINB12, FOLATE, FERRITIN, TIBC, IRON, RETICCTPCT in the last 72 hours.  Coagulation profile  Recent Labs Lab 05/07/15 1135  INR 1.46    No results for input(s): DDIMER in the last 72 hours.  Cardiac Enzymes No results for input(s): CKMB, TROPONINI, MYOGLOBIN in the last 168 hours.  Invalid input(s): CK ------------------------------------------------------------------------------------------------------------------ Invalid input(s): POCBNP   Time Spent in minutes  30   Mykel Mohl K M.D on 05/12/2015 at 11:15 AM  Between 7am to 7pm - Pager - [phone]  After 7pm go to [email or web address] - password [facility]  [facility] -  Office  [phone]

## 2015-05-12 NOTE — Evaluation (Addendum)
Clinical/Bedside Swallow Evaluation Patient Details  Name: Valerie Rodriguez MRN: [id number] Date of Birth: 04-17-1951  Today's Date: 05/12/2015 Time: SLP Start Time (ACUTE ONLY): 1130 SLP Stop Time (ACUTE ONLY): 1150 SLP Time Calculation (min) (ACUTE ONLY): 20 min  Past Medical History:  Past Medical History  Diagnosis Date  . Hypertension   . Hyperlipemia   . Deaf-mutism   . Glaucoma     Dr Eulah Pont  . Diabetes mellitus     Type II, sees Dr. Candie Chroman  . Allergy   . Asthma     sees Dr Elmo Callas  . Stroke 11-05-10    sees Dr. Pearlean Brownie  . Chronic kidney disease     sees Dr. Camille Bal   . PVD (peripheral vascular disease)     sees Dr. Hart Rochester   . PAD (peripheral artery disease)    Past Surgical History:  Past Surgical History  Procedure Laterality Date  . Cholecystectomy    . Abdominal hysterectomy      oophorectomy 1976  . Rotator cuff repair  2004  . Colonoscopy  03-30-09    per Dr. Marina Goodell, poor prep, repeat one yr    HPI:  Pt is a 64 y.o. female with PMH of Hypertension; Hyperlipemia; Deaf-mutism; Glaucoma; Diabetes mellitus; Allergy; Asthma; Stroke (11-05-10); Chronic kidney disease; PVD (peripheral vascular disease); and PAD (peripheral artery disease). Pt was in her usual state of health (communicative and interactive with sign language) until 2 months ago when she developed several bouts of PNA. The most recent one 2 weeks ago for which she was treated but over the last two weeks her daughter states she has been minimally interactive (if at all).On 7/23 was at her facility and she was found un responsive with O2 sats in the 60-70s. On arrival to the ED she was 80s on NRB mask. She was then intubated for hypoxemia and found to have thick tenatcious secretions and peri-intubation hypotension that resolved with fluid. Extubated 7/27 (4-day intubation). Pt had a FEES on recent admittion 6/8- recommended dysphagia 1/ nectar thick liquid diet. Bedside swallow eval ordered to  assess swallow function post- extubation.    Assessment / Plan / Recommendation Clinical Impression  Pt did not demonstrate overt s/s of aspiration; however, pt did have multiple swallows across consistencies, suspect delayed swallow initiation (inconsistent), and occasional moaning post-swallow- may indicate pain while swallowing. Interpreter present in room and communicated with pt via American Sign Language- pt responded to ~75% of yes/ no responses but did not respond to open ended questions/ did not use signs in response- difficult to assess cognitive status but pt did follow some 1-step commands for oral motor exam. Also difficult to fully assess vocal quality as pt was not able to follow directions to do so. Given hx of dysphagia and PNA, recent 4-day intubation, and possible decreased cognitive status, risk of aspiration appears high. Recommend that pt remain NPO (can have meds crushed in puree), further recommendations  pending FEES to objectively evaluate swallow function.  Aspiration Risk  Severe    Diet Recommendation NPO   Medication Administration: crushed in puree    Other  Recommendations Oral Care Recommendations: Oral care QID   Follow Up Recommendations       Frequency and Duration        Pertinent Vitals/Pain none    SLP Swallow Goals     Swallow Study Prior Functional Status       General Other Pertinent Information: Pt is a 64 y.o. female  with PMH of Hypertension; Hyperlipemia; Deaf-mutism; Glaucoma; Diabetes mellitus; Allergy; Asthma; Stroke (11-05-10); Chronic kidney disease; PVD (peripheral vascular disease); and PAD (peripheral artery disease). Pt was in her usual state of health (communicative and interactive with sign language) until 2 months ago when she developed several bouts of PNA. The most recent one 2 weeks ago for which she was treated but over the last two weeks her daughter states she has been minimally interactive (if at all).On 7/23 was at her  facility and she was found un responsive with O2 sats in the 60-70s. On arrival to the ED she was 80s on NRB mask. She was then intubated for hypoxemia and found to have thick tenatcious secretions and peri-intubation hypotension that resolved with fluid. Extubated 7/27 (4-day intubation). Pt had a FEES on recent admittion 6/8- recommended dysphagia 1/ nectar thick liquid diet. Bedside swallow eval ordered to assess swallow function post- extubation.  Type of Study: Bedside swallow evaluation Previous Swallow Assessment: see HPI Diet Prior to this Study: NPO Temperature Spikes Noted: No Respiratory Status: Room air History of Recent Intubation: Yes Length of Intubations (days): 4 days Date extubated: 05/11/15 Behavior/Cognition: Alert;Cooperative;Requires cueing Oral Cavity - Dentition: Adequate natural dentition/normal for age Self-Feeding Abilities: Needs assist Patient Positioning: Upright in bed Baseline Vocal Quality: Low vocal intensity Volitional Cough: Cognitively unable to elicit Volitional Swallow: Unable to elicit    Oral/Motor/Sensory Function Overall Oral Motor/Sensory Function: Impaired at baseline Labial ROM: Reduced right Labial Strength: Reduced Labial Sensation: Reduced Lingual ROM: Reduced right   Ice Chips Ice chips: Not tested   Thin Liquid Thin Liquid: Not tested    Nectar Thick Nectar Thick Liquid: Impaired Presentation: Cup;Straw Oral Phase Impairments: Reduced labial seal Oral phase functional implications: Right anterior spillage Pharyngeal Phase Impairments: Suspected delayed Swallow;Multiple swallows;Other (comments) (moaning after swallow)   Honey Thick Honey Thick Liquid: Not tested   Puree Puree: Impaired Presentation: Spoon Oral Phase Impairments: Reduced labial seal Oral Phase Functional Implications: Right anterior spillage Pharyngeal Phase Impairments: Multiple swallows   Solid   GO    Solid: Not tested       Metro Kung, MA,  CCC-SLP 05/12/2015,12:12 PM [phone]

## 2015-05-12 NOTE — Progress Notes (Signed)
SLP Cancellation Note  Patient Details Name: Valerie Rodriguez MRN: [id number] DOB: [birth year]   Cancelled treatment:       Reason Eval/Treat Not Completed: Other (comment). Plan to see pt when ASL interpreter available. Contact me at [phone]   Ares Cardozo, Riley Nearing 05/12/2015, 9:29 AM

## 2015-05-12 NOTE — Care Management Important Message (Signed)
Important Message  Patient Details  Name: TONIANNE FINE MRN: [id number] Date of Birth: 07/04/1951   Medicare Important Message Given:  Yes-second notification given    Kyla Balzarine 05/12/2015, 1:27 PMImportant Message  Patient Details  Name: PHILOMINA LEON MRN: [id number] Date of Birth: [birth year]   Medicare Important Message Given:  Yes-second notification given    Kyla Balzarine 05/12/2015, 1:27 PM

## 2015-05-12 NOTE — Progress Notes (Signed)
Utilization Review completed. Marjani Kobel RN BSN CM 

## 2015-05-12 NOTE — Progress Notes (Signed)
Nutrition Follow-up  DOCUMENTATION CODES:   Obesity unspecified  INTERVENTION:   -If pt/pt family decide on PEG placement, recommend:  Initiate Glucerna @ 20 ml/hr via PEG and increase by 10 ml every 4 hours to goal rate of 60 ml/hr.   Tube feeding regimen provides 1728 kcal (100% of needs), 86 grams of protein, and 1159 ml of H2O.    NUTRITION DIAGNOSIS:   Inadequate oral intake related to inability to eat as evidenced by NPO status.  Ongoing  GOAL:   Patient will meet greater than or equal to 90% of their needs  Unmet  MONITOR:   Diet advancement, Labs, Skin, I & O's  REASON FOR ASSESSMENT:   Consult Enteral/tube feeding initiation and management  ASSESSMENT:   Patient admitted on 7/23 with acute hypoxemic respiratory failure. Hx of being blind and deaf, and recent PNA.  Pt extubated and TF d/c on 05/11/15.   Pt remains NPO, with high risk for aspiration. Reviewed SLP note from 05/12/15- continuing to recommend NPO, with further recommendations s/p FEES.   Palliative care consulted for GOC. Reviewed note, which revealed that pt family acknowledges poor quality of life, however, are leaning towards PEG. They would like to discuss further with surgeon regarding potential risks. Per palliative note, pt family would like to transition to comfort care if pt continues to aspirate.   Reviewed COWRN note on 05/09/15. Pt with stage II pressure ulcer on lt heel, stage III pressure ulcer on rt heel, unstageable pressure ulcer on gluteal crease, and MASD on buttocks.   CSW and RNCM following. Discharge disposition is to return to SNF [facility]) or possible LTACH, dependent on pt's progress.   Labs reviewed: Na: 148 (on IV replacement), K: 3.3.   Diet Order:  Diet NPO time specified  Skin:  Wound (see comment) (st II lt heel, st III rt heel, UN scarum and gluteal crease,)  Last BM:  05/11/15  Height:   Ht Readings from Last 1 Encounters:  05/07/15 5'  1" (1.549 m)    Weight:   Wt Readings from Last 1 Encounters:  05/11/15 192 lb 10.9 oz (87.4 kg)    Ideal Body Weight:  47.7 kg  BMI:  Body mass index is 36.43 kg/(m^2).  Estimated Nutritional Needs:   Kcal:  1600-1800  Protein:  85-100 grams  Fluid:  1.6-1.8 L  EDUCATION NEEDS:   No education needs identified at this time  Latonja Bobeck A. Mayford Knife, RD, LDN, CDE Pager: [phone] After hours Pager: [phone]

## 2015-05-12 NOTE — Consult Note (Signed)
Consultation Note Date: 05/12/2015   Patient Name: Valerie Rodriguez  DOB: 01/05/1951  MRN: [id number]  Age / Sex: 64 y.o., female   PCP: No Pcp Per Patient Referring Physician: Thurnell Lose, MD  Reason for Consultation: Establishing goals of care  Palliative Care Assessment and Plan Summary of Established Goals of Care and Medical Treatment Preferences    Palliative Care Discussion Held Today:   I met today with Ms. Markus Daft and daughter, Horris Latino, at bedside. Ms. Bou is lethargic and did look at me when I entered her room Horris Latino confirms that she is not blind but is impaired with glaucoma) but otherwise not very interactive. She yawns and drifts to sleep with drool seeping out of corner of her mouth. Horris Latino says that she has been speaking with her 2 sisters and was not interested in setting a meeting for [place] all to meet - she says that she can pass along any information to them as she is the one who usually makes the decisions. Horris Latino goes on to tell me that they have already spoken and have decided to proceed with PEG tube with hope of providing her nutrition to extend her life. We discussed risk/benefits of PEG including aspiration and she is understanding - she also wants to talk with surgeon to make sure that the risks are not too great before final decision. She says that they will be comfortable if she aspirates even once to transition to comfort/hospice but they need to know that they have tried everything. She seems very reasonable and understanding but also hopeful that this will provide her mother some more time (she says "even if we just have a week that will be one more week"). She does acknowledge her mother's poor QOL and does not believe there is anything that may be done to improve this. She confirms DNR. Will follow for support.    Contacts/Participants in Discussion: Primary Decision Maker: Daughters   Goals of Care/Code Status/Advance Care Planning:   Code Status:  DNR   Symptom Management:   Dysphagia/Aspiration: Likely will get PEG tube and if continues to aspirate will transition to comfort.   Psycho-social/Spiritual:   Support System: Daughter is at bedside.   Prognosis: < 6 months  Discharge Planning:  SNF with palliative as likely transition to hospice (if aspirate prior to discharge may need hospice now)       Chief Complaint: Acute respiratory failure  History of Present Illness: 64 yo deaf with glaucoma, recurrent pnuemonia, HTN, DM, asthma, multiple strokes, CKD, PVD, PAD. Required intubation with successful extubation and made DNR. Severe risk of continued aspiration (on dysphagia I/nectars PTA) and lives in SNF for past few years.   Primary Diagnoses  Present on Admission:  . HCAP (healthcare-associated pneumonia) . Acute respiratory failure with hypoxia . Altered mental status . Hypernatremia  Palliative Review of Systems:   Unable to assess - minimal interaction and will not sign back even to daughter   I have reviewed the medical record, interviewed the patient and family, and examined the patient. The following aspects are pertinent.  Past Medical History  Diagnosis Date  . Hypertension   . Hyperlipemia   . Deaf-mutism   . Glaucoma     Dr Janyth Contes  . Diabetes mellitus     Type II, sees Dr. Donna Christen  . Allergy   . Asthma     sees Dr Donneta Romberg  . Stroke 11-05-10    sees Dr. Leonie Man  . Chronic kidney disease  sees Dr. Jamal Maes   . PVD (peripheral vascular disease)     sees Dr. Kellie Simmering   . PAD (peripheral artery disease)    History   Social History  . Marital Status: Widowed    Spouse Name: N/A  . Number of Children: N/A  . Years of Education: N/A   Social History Main Topics  . Smoking status: Former Smoker    Types: Cigarettes    Quit date: 12/21/1979  . Smokeless tobacco: Never Used  . Alcohol Use: No  . Drug Use: No  . Sexual Activity: Not on file   Other Topics Concern  . None    Social History Narrative   Family History  Problem Relation Age of Onset  . Hypertension      family hx  . Coronary artery disease      female 1st degree relative  . Hyperlipidemia      family hx  . Diabetes      1st degree relative  . Diabetes Mother   . Heart disease Mother   . Hypertension Mother   . Hyperlipidemia Mother    Scheduled Meds: . ampicillin-sulbactam (UNASYN) IV  1.5 g Intravenous Q6H  . antiseptic oral rinse  7 mL Mouth Rinse QID  . atenolol  50 mg Oral BID  . chlorhexidine  15 mL Mouth Rinse BID  . citalopram  20 mg Oral Daily  . clopidogrel  75 mg Oral Daily  . collagenase   Topical Daily  . heparin  5,000 Units Subcutaneous 3 times per day  . hydrALAZINE  25 mg Oral 3 times per day  . insulin aspart  0-20 Units Subcutaneous 6 times per day  . insulin glargine  20 Units Subcutaneous Daily  . latanoprost  1 drop Both Eyes QHS  . pregabalin  75 mg Oral BID  . saccharomyces boulardii  250 mg Oral BID   Continuous Infusions: . dextrose 125 mL/hr at 05/12/15 0810   PRN Meds:.sodium chloride, dextrose, hydrALAZINE, ipratropium-albuterol, labetalol Medications Prior to Admission:  Prior to Admission medications   Medication Sig Start Date End Date Taking? Authorizing Provider  albuterol (PROVENTIL HFA;VENTOLIN HFA) 108 (90 BASE) MCG/ACT inhaler Inhale 2 puffs into the lungs every 6 (six) hours as needed for wheezing or shortness of breath.   Yes Historical Provider, MD  albuterol-ipratropium (COMBIVENT) 18-103 MCG/ACT inhaler Inhale 2 puffs into the lungs 4 (four) times daily. 01/06/14  Yes Laurey Morale, MD  amoxicillin-clavulanate (AUGMENTIN) 875-125 MG per tablet Take 1 tablet by mouth 2 (two) times daily.   Yes Historical Provider, MD  atenolol (TENORMIN) 50 MG tablet Take 1 tablet (50 mg total) by mouth daily. 01/06/14  Yes Laurey Morale, MD  atorvastatin (LIPITOR) 80 MG tablet Take 1 tablet (80 mg total) by mouth daily. Patient taking differently: Take  80 mg by mouth every evening.  01/06/14  Yes Laurey Morale, MD  azelastine (ASTELIN) 137 MCG/SPRAY nasal spray Place 1 spray into both nostrils 2 (two) times daily. Use in each nostril as directed 01/06/14  Yes Laurey Morale, MD  budesonide-formoterol [facility]) 160-4.5 MCG/ACT inhaler Inhale 2 puffs into the lungs 2 (two) times daily. 01/06/14  Yes Laurey Morale, MD  citalopram (CELEXA) 10 MG tablet Take 20 mg by mouth daily.    Yes Historical Provider, MD  clopidogrel (PLAVIX) 75 MG tablet Take 1 tablet (75 mg total) by mouth daily. 01/06/14  Yes Laurey Morale, MD  docusate sodium (COLACE) 100 MG  capsule Take 100 mg by mouth every morning.   Yes Historical Provider, MD  enalapril (VASOTEC) 2.5 MG tablet Take 2.5 mg by mouth every morning.  04/06/14  Yes Historical Provider, MD  ferrous sulfate 325 (65 FE) MG tablet Take 325 mg by mouth every morning.   Yes Historical Provider, MD  fluticasone (FLONASE) 50 MCG/ACT nasal spray Place 2 sprays into both nostrils daily. 01/06/14  Yes Laurey Morale, MD  Fluticasone Furoate-Vilanterol 100-25 MCG/INH AEPB Inhale 1 puff into the lungs every morning.   Yes Historical Provider, MD  furosemide (LASIX) 20 MG tablet Take 1 tablet (20 mg total) by mouth daily. 01/01/14  Yes Laurey Morale, MD  hydrALAZINE (APRESOLINE) 50 MG tablet Take 1 tablet (50 mg total) by mouth 2 (two) times daily. 01/06/14  Yes Laurey Morale, MD  HYDROcodone-acetaminophen (NORCO/VICODIN) 5-325 MG per tablet Take 1 tablet by mouth 2 (two) times daily. 04/12/15  Yes Theodis Blaze, MD  Insulin Detemir (LEVEMIR FLEXPEN) 100 UNIT/ML Pen Inject 10 Units into the skin every evening.   Yes Historical Provider, MD  insulin glargine (LANTUS) 100 UNIT/ML injection Inject 0.2 mLs (20 Units total) into the skin at bedtime. 01/14/13  Yes Reyne Dumas, MD  insulin lispro (HUMALOG) 100 UNIT/ML KiwkPen Inject 10 Units into the skin 3 (three) times daily before meals.   Yes Historical Provider, MD   ipratropium-albuterol (DUONEB) 0.5-2.5 (3) MG/3ML SOLN Take 3 mLs by nebulization every 4 (four) hours.    Yes Historical Provider, MD  latanoprost (XALATAN) 0.005 % ophthalmic solution Place 1 drop into both eyes at bedtime.     Yes Historical Provider, MD  montelukast (SINGULAIR) 10 MG tablet Take 1 tablet (10 mg total) by mouth at bedtime. 01/06/14  Yes Laurey Morale, MD  pantoprazole (PROTONIX) 40 MG tablet Take 1 tablet (40 mg total) by mouth daily. 01/06/14  Yes Laurey Morale, MD  polyethylene glycol [facility] / GLYCOLAX) packet Take 17 g by mouth every morning.   Yes Historical Provider, MD  pregabalin (LYRICA) 200 MG capsule Take 1 capsule (200 mg total) by mouth 2 (two) times daily. 01/06/14  Yes Laurey Morale, MD  saccharomyces boulardii (FLORASTOR) 250 MG capsule Take 250 mg by mouth 2 (two) times daily.   Yes Historical Provider, MD  Vitamin D, Ergocalciferol, (DRISDOL) 50000 UNITS CAPS capsule Take 50,000 Units by mouth every 30 (thirty) days.   Yes Historical Provider, MD   No Known Allergies CBC:    Component Value Date/Time   WBC 7.9 05/10/2015 0525   HGB 8.9* 05/10/2015 0525   HCT 28.9* 05/10/2015 0525   PLT 121* 05/10/2015 0525   MCV 83.5 05/10/2015 0525   NEUTROABS 6.8 05/07/2015 0257   LYMPHSABS 2.1 05/07/2015 0257   MONOABS 1.1* 05/07/2015 0257   EOSABS 0.0 05/07/2015 0257   BASOSABS 0.0 05/07/2015 0257   Comprehensive Metabolic Panel:    Component Value Date/Time   NA 148* 05/12/2015 0541   K 3.3* 05/12/2015 0541   CL 119* 05/12/2015 0541   CO2 22 05/12/2015 0541   BUN 38* 05/12/2015 0541   CREATININE 1.03* 05/12/2015 0541   GLUCOSE 221* 05/12/2015 0541   CALCIUM 8.5* 05/12/2015 0541   AST 51* 05/07/2015 0257   ALT 18 05/07/2015 0257   ALKPHOS 61 05/07/2015 0257   BILITOT 0.7 05/07/2015 0257   PROT 7.2 05/07/2015 0257   ALBUMIN 2.6* 05/07/2015 0257    Physical Exam:  Vital Signs: BP 141/53 mmHg  Pulse  67  Temp(Src) 98.2 F (36.8 C) (Oral)  Resp 18   Ht 5' 1"  (1.549 m)  Wt 87.4 kg (192 lb 10.9 oz)  BMI 36.43 kg/m2  SpO2 100% SpO2: SpO2: 100 % O2 Device: O2 Device: Nasal Cannula O2 Flow Rate: O2 Flow Rate (L/min): 2 L/min Intake/output summary:  Intake/Output Summary (Last 24 hours) at 05/12/15 1336 Last data filed at 05/12/15 0933  Gross per 24 hour  Intake  731.8 ml  Output    925 ml  Net -193.2 ml   LBM: Last BM Date: 05/11/15 Baseline Weight: Weight: 89 kg (196 lb 3.4 oz) (from 04/12/15) Most recent weight: Weight: 87.4 kg (192 lb 10.9 oz)  Exam Findings:  General: NAD, lying in bed HEENT: [place]/AT, moist mucous membranes, poor control of secretions CVS: RRR Resp: No labored breathing, symmetric, room air Abd: Soft, NT, ND Neuro: Lethargic, mentation fluctuation - will not sign back to daughter so difficult to assess           Palliative Performance Scale: 20 %                Additional Data Reviewed: Recent Labs     05/10/15  0525   05/11/15  1523  05/12/15  0541  WBC  7.9   --    --    --   HGB  8.9*   --    --    --   PLT  121*   --    --    --   NA  154*   < >  150*  148*  BUN  63*   < >  56*  38*  CREATININE  1.47*   < >  1.10*  1.03*   < > = values in this interval not displayed.     Time In: 1220 Time Out: 1340 Time Total: 58mn  Greater than 50%  of this time was spent counseling and coordinating care related to the above assessment and plan.  Discussed plan of care with Dr. SCandiss Norse   Signed by:  AVinie Sill NP Palliative Medicine Team Pager # [phone](M-F 8a-5p) Team Phone # [phone](Nights/Weekends)

## 2015-05-12 NOTE — Evaluation (Signed)
Physical Therapy Evaluation and Discharge Patient Details Name: Valerie Rodriguez MRN: [id number] DOB: 1951/03/01 Today's Date: 05/12/2015   History of Present Illness  Pt is a 64 y/o female with a past medical history of HTN, HLD, DM, stroke, CKD, PVD, and PAD who was in her usual state of health (communicative and interactive with sign language) until 2 months ago when she developed several bouts of PNA. The most recent one 2 weeks ago for which she was treated but over the last two weeks her daughter states she has been minimally interactive (if at all). 7/23 she was at her facility and she was found unresponsive with O2 sats in the 60-70s.On arrival to the ED she was 80s on NRB mask. She was then intubated for hypoxemia and found to have thick tenatcious secretions and peri-intubation hypotension that resolved with fluid. She was intubated and kept in ICU by pulmonary critical care team, she was extubated on 05/11/2015.   Clinical Impression  Patient evaluated by Physical Therapy with no further acute PT needs identified. Communication was limited during session even though ASL interpreter was present. Pt answered simple questions with a minimal movement of her head to indicate yes or no. It appears that pt is non-ambulatory at baseline and staff assists her in and out of bed (likely with a lift). Did not note any active movement throughout session, and may require further therapy at SNF if she begins to move her extremities more during activity. Will defer further therapy needs to SNF. See below for any follow-up Physial Therapy or equipment needs. Acute PT is signing off. Thank you for this referral.     Follow Up Recommendations SNF;Supervision/Assistance - 24 hour    Equipment Recommendations  None recommended by PT    Recommendations for Other Services       Precautions / Restrictions Precautions Precautions: Fall Restrictions Weight Bearing Restrictions: No      Mobility  Bed  Mobility Overal bed mobility: Needs Assistance             General bed mobility comments: Attempted basic bed mobility tasks. Pt with no active movement noted during session. Attempted rolling and was not able to complete without a second person.   Transfers                    Ambulation/Gait                Stairs            Wheelchair Mobility    Modified Rankin (Stroke Patients Only)       Balance Overall balance assessment: Needs assistance     Sitting balance - Comments: Poor trunk control noted with significant L lateral lean while supported in the bed.                                      Pertinent Vitals/Pain Pain Assessment: Faces Faces Pain Scale: No hurt    Home Living Family/patient expects to be discharged to:: Skilled nursing facility                      Prior Function Level of Independence: Needs assistance   Gait / Transfers Assistance Needed: Per chart review pt is non-ambulatory. Pt confirms that she gets OOB with assist of nursing but does not walk.   ADL's / Homemaking Assistance Needed: Likely extensive  assist required for ADL's        Hand Dominance        Extremity/Trunk Assessment   Upper Extremity Assessment: Generalized weakness           Lower Extremity Assessment: Generalized weakness         Communication   Communication: Interpreter utilized (ASL)  Cognition Arousal/Alertness: Lethargic Behavior During Therapy: Flat affect Overall Cognitive Status: Impaired/Different from baseline Area of Impairment: Awareness           Awareness: Intellectual        General Comments      Exercises        Assessment/Plan    PT Assessment Patent does not need any further PT services  PT Diagnosis Generalized weakness   PT Problem List    PT Treatment Interventions     PT Goals (Current goals can be found in the Care Plan section) Acute Rehab PT Goals PT Goal  Formulation: All assessment and education complete, DC therapy    Frequency     Barriers to discharge        Co-evaluation               End of Session   Activity Tolerance: No increased pain;Patient limited by lethargy Patient left: in bed;with call bell/phone within reach Nurse Communication: Mobility status;Need for lift equipment         Time: 1610-9604 PT Time Calculation (min) (ACUTE ONLY): 22 min   Charges:   PT Evaluation $Initial PT Evaluation Tier I: 1 Procedure     PT G Codes:        Conni Slipper 23-May-2015, 2:56 PM  Conni Slipper, PT, DPT Acute Rehabilitation Services Pager: [phone]

## 2015-05-13 LAB — CBC
HEMATOCRIT: 31.1 % — AB (ref 36.0–46.0)
Hemoglobin: 10.2 g/dL — ABNORMAL LOW (ref 12.0–15.0)
MCH: 26.2 pg (ref 26.0–34.0)
MCHC: 32.8 g/dL (ref 30.0–36.0)
MCV: 79.9 fL (ref 78.0–100.0)
PLATELETS: 121 10*3/uL — AB (ref 150–400)
RBC: 3.89 MIL/uL (ref 3.87–5.11)
RDW: 17.1 % — AB (ref 11.5–15.5)
WBC: 7.7 10*3/uL (ref 4.0–10.5)

## 2015-05-13 LAB — BASIC METABOLIC PANEL
Anion gap: 6 (ref 5–15)
BUN: 26 mg/dL — ABNORMAL HIGH (ref 6–20)
CHLORIDE: 109 mmol/L (ref 101–111)
CO2: 24 mmol/L (ref 22–32)
Calcium: 8.4 mg/dL — ABNORMAL LOW (ref 8.9–10.3)
Creatinine, Ser: 0.93 mg/dL (ref 0.44–1.00)
GFR calc non Af Amer: >60 mL/min (ref >60)
GLUCOSE: 215 mg/dL — AB (ref 65–99)
Potassium: 3.4 mmol/L — ABNORMAL LOW (ref 3.5–5.1)
Sodium: 139 mmol/L (ref 135–145)

## 2015-05-13 LAB — GLUCOSE, CAPILLARY
GLUCOSE-CAPILLARY: 156 mg/dL — AB (ref 65–99)
GLUCOSE-CAPILLARY: 196 mg/dL — AB (ref 65–99)
Glucose-Capillary: 227 mg/dL — ABNORMAL HIGH (ref 65–99)
Glucose-Capillary: 239 mg/dL — ABNORMAL HIGH (ref 65–99)
Glucose-Capillary: 155 mg/dL — ABNORMAL HIGH (ref 65–99)
Glucose-Capillary: 135 mg/dL — ABNORMAL HIGH (ref 65–99)

## 2015-05-13 LAB — MAGNESIUM: Magnesium: 1.4 mg/dL — ABNORMAL LOW (ref 1.7–2.4)

## 2015-05-13 MED ORDER — ATENOLOL 50 MG PO TABS
25.0000 mg | ORAL_TABLET | Freq: Once | ORAL | Status: AC
  Administered 2015-05-13: 25 mg via ORAL
  Filled 2015-05-13: qty 1

## 2015-05-13 MED ORDER — DEXTROSE 5 % IV SOLN
INTRAVENOUS | Status: DC
  Administered 2015-05-13: 13:00:00 via INTRAVENOUS

## 2015-05-13 NOTE — Procedures (Signed)
Objective Swallowing Evaluation:   FEES Patient Details  Name: Valerie Rodriguez MRN: [id number] Date of Birth: [birth year]  Today's Date: 05/13/2015 Time: SLP Start Time (ACUTE ONLY): 1030-SLP Stop Time (ACUTE ONLY): 1115 SLP Time Calculation (min) (ACUTE ONLY): 45 min  Past Medical History:  Past Medical History  Diagnosis Date  . Hypertension   . Hyperlipemia   . Deaf-mutism   . Glaucoma     Dr Eulah Pont  . Diabetes mellitus     Type II, sees Dr. Candie Chroman  . Allergy   . Asthma     sees Dr Hamburg Callas  . Stroke 11-05-10    sees Dr. Pearlean Brownie  . Chronic kidney disease     sees Dr. Camille Bal   . PVD (peripheral vascular disease)     sees Dr. Hart [place]   . PAD (peripheral artery disease)    Past Surgical History:  Past Surgical History  Procedure Laterality Date  . Cholecystectomy    . Abdominal hysterectomy      oophorectomy 1976  . Rotator cuff repair  2004  . Colonoscopy  03-30-09    per Dr. Marina Goodell, poor prep, repeat one yr    HPI:  Other Pertinent Information: Pt is a 64 y.o. female with PMH of Hypertension; Hyperlipemia; Deaf-mutism; Glaucoma; Diabetes mellitus; Allergy; Asthma; Stroke (11-05-10); Chronic kidney disease; PVD (peripheral vascular disease); and PAD (peripheral artery disease). Pt was in her usual state of health (communicative and interactive with sign language) until 2 months ago when she developed several bouts of PNA. The most recent one 2 weeks ago for which she was treated but over the last two weeks her daughter states she has been minimally interactive (if at all).On 7/23 was at her facility and she was found un responsive with O2 sats in the 60-70s. On arrival to the ED she was 80s on NRB mask. She was then intubated for hypoxemia and found to have thick tenatcious secretions and peri-intubation hypotension that resolved with fluid. Extubated 7/27 (4-day intubation). Pt had a FEES on recent admittion 6/8- recommended dysphagia 1/ nectar thick liquid  diet. Bedside swallow eval ordered to assess swallow function post- extubation.   No Data Recorded  Assessment / Plan / Recommendation CHL IP CLINICAL IMPRESSIONS 05/13/2015  Therapy Diagnosis Moderate pharyngeal phase dysphagia;Moderate oral phase dysphagia  Clinical Impression Pt presents with swallow function stable since last FEES. Oral phase is impaired with lingual pumping and slow transit with oral resiudals that fall to airway post swallow. Pt does reflexively swallow this residuue. Swallow initiation is delayed with penetration to the cords with thin and large nectar thick boluses before the swallow, though no aspiration observed. Would continue to recommend PO intake for this pt when she is alert and recpetive (likely her arousal waxes and wanes). Best diet texture would be purees and honey thick liquids via teaspoon. There will be ongoing high risk of aspiration regardless of PO intake due to aspiration of secretions, which was observed during assessment. Discussed with MD. Will discuss with family for decisions regarding diet.       CHL IP TREATMENT RECOMMENDATION 05/13/2015  Treatment Recommendations Therapy as outlined in treatment plan below     CHL IP DIET RECOMMENDATION 05/13/2015  SLP Diet Recommendations Dysphagia 1 (Puree);Honey  Liquid Administration via (None)  Medication Administration Crushed with puree  Compensations Externally pace;Slow rate;Small sips/bites  Postural Changes and/or Swallow Maneuvers (None)     CHL IP OTHER RECOMMENDATIONS 05/13/2015  Recommended Consults (None)  Oral Care  Recommendations Oral care BID  Other Recommendations Order thickener from pharmacy     CHL IP FOLLOW UP RECOMMENDATIONS 04/11/2015  Follow up Recommendations (No Data)     CHL IP FREQUENCY AND DURATION 05/13/2015  Speech Therapy Frequency (ACUTE ONLY) min 2x/week  Treatment Duration 2 weeks     Pertinent Vitals/Pain NA    SLP Swallow Goals No flowsheet data found.  No  flowsheet data found.    CHL IP REASON FOR REFERRAL 05/13/2015  Reason for Referral Objectively evaluate swallowing function     CHL IP ORAL PHASE 05/13/2015  Lips (None)  Tongue (None)  Mucous membranes (None)  Nutritional status (None)  Other (None)  Oxygen therapy (None)  Oral Phase Impaired  Oral - Pudding Teaspoon (None)  Oral - Pudding Cup (None)  Oral - Honey Teaspoon (None)  Oral - Honey Cup (None)  Oral - Honey Syringe (None)  Oral - Nectar Teaspoon (None)  Oral - Nectar Cup (None)  Oral - Nectar Straw (None)  Oral - Nectar Syringe (None)  Oral - Ice Chips (None)  Oral - Thin Teaspoon (None)  Oral - Thin Cup (None)  Oral - Thin Straw (None)  Oral - Thin Syringe (None)  Oral - Puree (None)  Oral - Mechanical Soft (None)  Oral - Regular (None)  Oral - Multi-consistency (None)  Oral - Pill (None)  Oral Phase - Comment (None)      CHL IP PHARYNGEAL PHASE 05/13/2015  Pharyngeal Phase Impaired  Pharyngeal - Pudding Teaspoon (None)  Penetration/Aspiration details (pudding teaspoon) (None)  Pharyngeal - Pudding Cup (None)  Penetration/Aspiration details (pudding cup) (None)  Pharyngeal - Honey Teaspoon (None)  Penetration/Aspiration details (honey teaspoon) (None)  Pharyngeal - Honey Cup (None)  Penetration/Aspiration details (honey cup) (None)  Pharyngeal - Honey Syringe (None)  Penetration/Aspiration details (honey syringe) (None)  Pharyngeal - Nectar Teaspoon (None)  Penetration/Aspiration details (nectar teaspoon) (None)  Pharyngeal - Nectar Cup (None)  Penetration/Aspiration details (nectar cup) (None)  Pharyngeal - Nectar Straw (None)  Penetration/Aspiration details (nectar straw) (None)  Pharyngeal - Nectar Syringe (None)  Penetration/Aspiration details (nectar syringe) (None)  Pharyngeal - Ice Chips (None)  Penetration/Aspiration details (ice chips) (None)  Pharyngeal - Thin Teaspoon (None)  Penetration/Aspiration details (thin teaspoon) (None)   Pharyngeal - Thin Cup (None)  Penetration/Aspiration details (thin cup) (None)  Pharyngeal - Thin Straw (None)  Penetration/Aspiration details (thin straw) (None)  Pharyngeal - Thin Syringe (None)  Penetration/Aspiration details (thin syringe') (None)  Pharyngeal - Puree (None)  Penetration/Aspiration details (puree) (None)  Pharyngeal - Mechanical Soft (None)  Penetration/Aspiration details (mechanical soft) (None)  Pharyngeal - Regular (None)  Penetration/Aspiration details (regular) (None)  Pharyngeal - Multi-consistency (None)  Penetration/Aspiration details (multi-consistency) (None)  Pharyngeal - Pill (None)  Penetration/Aspiration details (pill) (None)  Pharyngeal Comment (None)      No flowsheet data found.  No flowsheet data found.        Harlon Ditty, [place] CCC-SLP [phone]  Claudine Mouton 05/13/2015, 11:55 AM

## 2015-05-13 NOTE — Clinical Social Work Note (Signed)
CSW continues to follow patient for DC needs. Patient is able to return to [facility] once medically stable for DC. Facility can accommodate a weekend discharge if necessary. Report left for weekend CSW.   Roddie Mc MSW, [place], [place], [phone]

## 2015-05-13 NOTE — Progress Notes (Signed)
Valerie Rodriguez is in bed, no family at bedside. SLP setting up for FEES test. ASL interpretor at bedside and Ms. Ortego responds only with head nod. She denies pain or discomfort. SLP will follow up with family with recommendations and they will have to decide if they want comfort feedings. Daughter, [name], was very specific about their goals yesterday - I am unsure if she will be open to comfort feeds at this time. Please call with any acute palliative needs over the weekend.   Yong Channel, NP Palliative Medicine Team Pager # [phone] (M-F 8a-5p) Team Phone # [phone] (Nights/Weekends)

## 2015-05-13 NOTE — Care Management Important Message (Signed)
Important Message  Patient Details  Name: AFTEN LIPSEY MRN: [id number] Date of Birth: 1950/12/22   Medicare Important Message Given:  Yes-third notification given    Lawerance Sabal, RN 05/13/2015, 10:31 AM

## 2015-05-13 NOTE — Progress Notes (Signed)
Patient Demographics:    Stefanie Hodgens, is a 64 y.o. female, DOB - 30-Jun-1951, WJX:[id number]  Admit date - 05/07/2015   Admitting Physician Leslye Peer, MD  Outpatient Primary MD for the patient is No PCP Per Patient  LOS - 6   Chief Complaint  Patient presents with  . Altered Mental Status  . 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 Patient became less interactive and then was found unresponsive in her facility with O2 sats in 60-70s. Patient intubated.  Patient has a history of HTN, HLD, DM, stroke, CKD, PVD, and PAD who was in her usual state of health (communicative and interactive with sign language) until 2 months ago when she developed several bouts of PNA. The most recent one 2 weeks ago for which she was treated but over the last two weeks her daughter states she has been minimally interactive (if at all). 7/23 she was at her facility and she was found un responsive with O2 sats in the 60-70s. On arrival to the ED she was 80s on NRB mask. She was then intubated for hypoxemia and found to have thick tenatcious secretions and peri-intubation hypotension that resolved with fluid.  She was intubated and kept in ICU by pulmonary critical care team, she was extubated on 05/11/2015 in transfer to hospitalist service under my care on 05/12/2015. She is still minimally responsive with open her eyes to physical stimuli but will not respond to any commands. Continues to have severe generalized weakness. Continues to appear to be very high risk for aspiration.     Subjective:    [name] today in bed, nonverbal, makes eye contact only, does not appear to be in any distress.   Assessment  & Plan :     1. Recurrent acute on chronic  hypoxic respiratory failure due to COPD exacerbation along with recurrent aspiration pneumonia -HCAP. Patient has had several bouts of recent pneumonia, this admission she required endotracheal intubation. She has extremely poor baseline quality of life with bedbound status, baseline deaf and mute, and now evidence of high risk for recurrent aspiration.  She has finished her steroids, she is now on nasal cannula oxygen after extubation, will stop all other anti-biotics and leave her only on Unasyn on 05/12/2015 Will treat for a total duration of 7 days as clinically much improved, continue nebulizer treatments, seen by speech therapy and underwent FEES study, placed on dysphagia 1 diet. However remains very high risk for aspiration in the future. Discussed with patient's daughter who is interested in a feeding tube, feeding tube will be placed as I think oral intake will remain questionable with her waxing and waning mentation and high risk for aspiration.  Daughter agreeable that if patient declines further she will focus on comfort patient is a DO NOT RESUSCITATE now.   2. Severe dehydration with acute renal failure and hypernatremia. Improved with D5W hydration.   3. Generalized weakness, deaf-mute. Extremely poor baseline quality of life. What really bedbound with minimal responsiveness per daughter at baseline. PT, palliative care for goals of care.   4. Risk of aspiration. See #1 above.   5. Hypertension.  Oral medications if able to tolerate, as needed IV hydralazine.   6. DM type II. Currently on sliding scale along with Lantus continue.   Lab Results  Component Value Date   HGBA1C 9.4* 04/09/2015    CBG (last 3)   Recent Labs  05/12/15 2332 05/13/15 0414 05/13/15 0813  GLUCAP 191* 196* 227*      Code Status : DO NOT RESUSCITATE  Family Communication  : Daughter over the phone  Disposition Plan  : SNF in a few days  Consults  :  Pulmonary critical care, palliative  care  Procedures  :   Intubation upon admission and extubated 05/11/2015.  TTE done one month ago shows EF of 60% with chronic grade 1 compensated diastolic heart failure. No wall motion abnormality.    DVT Prophylaxis  :  Heparin   Lab Results  Component Value Date   PLT 121* 05/13/2015    Inpatient Medications  Scheduled Meds: . ampicillin-sulbactam (UNASYN) IV  1.5 g Intravenous Q6H  . antiseptic oral rinse  7 mL Mouth Rinse QID  . atenolol  50 mg Oral BID  . chlorhexidine  15 mL Mouth Rinse BID  . citalopram  20 mg Oral Daily  . clopidogrel  75 mg Oral Daily  . collagenase   Topical Daily  . heparin  5,000 Units Subcutaneous 3 times per day  . hydrALAZINE  25 mg Oral 3 times per day  . insulin aspart  0-20 Units Subcutaneous 6 times per day  . insulin glargine  20 Units Subcutaneous Daily  . latanoprost  1 drop Both Eyes QHS  . pregabalin  75 mg Oral BID  . saccharomyces boulardii  250 mg Oral BID   Continuous Infusions:   PRN Meds:.sodium chloride, dextrose, hydrALAZINE, ipratropium-albuterol, labetalol  Antibiotics  :     Anti-infectives    Start     Dose/Rate Route Frequency Ordered Stop   05/12/15 1200  ampicillin-sulbactam (UNASYN) 1.5 g in sodium chloride 0.9 % 50 mL IVPB     1.5 g 100 mL/hr over 30 Minutes Intravenous Every 6 hours 05/12/15 1131     05/09/15 1530  cefTRIAXone (ROCEPHIN) 2 g in dextrose 5 % 50 mL IVPB - Premix  Status:  Discontinued     2 g 100 mL/hr over 30 Minutes Intravenous Every 24 hours 05/09/15 1447 05/12/15 1130   05/09/15 1000  vancomycin (VANCOCIN) IVPB 1000 mg/200 mL premix  Status:  Discontinued     1,000 mg 200 mL/hr over 60 Minutes Intravenous Every 48 hours 05/07/15 0857 05/08/15 0840   05/08/15 1400  piperacillin-tazobactam (ZOSYN) IVPB 3.375 g  Status:  Discontinued     3.375 g 12.5 mL/hr over 240 Minutes Intravenous 3 times per day 05/08/15 0840 05/09/15 1446   05/08/15 1100  vancomycin (VANCOCIN) IVPB 1000 mg/200 mL  premix  Status:  Discontinued     1,000 mg 200 mL/hr over 60 Minutes Intravenous Every 24 hours 05/08/15 0840 05/09/15 1446   05/07/15 1200  piperacillin-tazobactam (ZOSYN) IVPB 2.25 g  Status:  Discontinued     2.25 g 100 mL/hr over 30 Minutes Intravenous Every 6 hours 05/07/15 0846 05/08/15 0840   05/07/15 0930  vancomycin (VANCOCIN) 500 mg in sodium chloride 0.9 % 100 mL IVPB     500 mg 100 mL/hr over 60 Minutes Intravenous  Once 05/07/15 0857 05/07/15 1121   05/07/15 0830  vancomycin (VANCOCIN) IVPB 750 mg/150 ml premix  Status:  Discontinued     750 mg  150 mL/hr over 60 Minutes Intravenous Every 12 hours 05/07/15 0819 05/07/15 0844   05/07/15 0830  piperacillin-tazobactam (ZOSYN) IVPB 3.375 g  Status:  Discontinued     3.375 g 12.5 mL/hr over 240 Minutes Intravenous 3 times per day 05/07/15 0819 05/07/15 0843   05/07/15 0315  piperacillin-tazobactam (ZOSYN) IVPB 3.375 g     3.375 g 100 mL/hr over 30 Minutes Intravenous  Once 05/07/15 0312 05/07/15 0423   05/07/15 0315  vancomycin (VANCOCIN) IVPB 1000 mg/200 mL premix     1,000 mg 200 mL/hr over 60 Minutes Intravenous  Once 05/07/15 0312 05/07/15 0519        Objective:   Filed Vitals:   05/12/15 1539 05/12/15 2219 05/12/15 2223 05/13/15 0418  BP: 126/42 131/76 131/76 140/57  Pulse: 59 64 64 58  Temp: 98.4 F (36.9 C)  98.5 F (36.9 C) 98 F (36.7 C)  TempSrc: Oral  Oral Oral  Resp: 18  18 18   Height:      Weight:      SpO2: 100%  100% 100%    Wt Readings from Last 3 Encounters:  05/11/15 87.4 kg (192 lb 10.9 oz)  04/12/15 89 kg (196 lb 3.4 oz)  03/26/15 98.2 kg (216 lb 7.9 oz)     Intake/Output Summary (Last 24 hours) at 05/13/15 1252 Last data filed at 05/13/15 0900  Gross per 24 hour  Intake      0 ml  Output   1350 ml  Net  -1350 ml     Physical Exam  Awake , makes eye contact only, moves upper extremity minimally. [place].AT,PERRAL Supple Neck,No JVD, No cervical lymphadenopathy appriciated.    Symmetrical Chest wall movement, Good air movement bilaterally, CTAB RRR,No Gallops,Rubs or new Murmurs, No Parasternal Heave +ve B.Sounds, Abd Soft, No tenderness, No organomegaly appriciated, No rebound - guarding or rigidity. No Cyanosis, Clubbing or edema, No new Rash or bruise    Wearing splints in both legs    Data Review:   Micro Results Recent Results (from the past 240 hour(s))  Culture, blood (routine x 2)     Status: None   Collection Time: 05/07/15  2:57 AM  Result Value Ref Range Status   Specimen Description BLOOD LEFT HAND  Final   Special Requests BOTTLES DRAWN AEROBIC AND ANAEROBIC 5CC  Final   Culture NO GROWTH 5 DAYS  Final   Report Status 05/12/2015 FINAL  Final  Culture, blood (routine x 2)     Status: None   Collection Time: 05/07/15  3:02 AM  Result Value Ref Range Status   Specimen Description BLOOD RIGHT HAND  Final   Special Requests BOTTLES DRAWN AEROBIC AND ANAEROBIC 5CC  Final   Culture NO GROWTH 5 DAYS  Final   Report Status 05/12/2015 FINAL  Final  MRSA PCR Screening     Status: None   Collection Time: 05/07/15  8:16 AM  Result Value Ref Range Status   MRSA by PCR NEGATIVE NEGATIVE Final    Comment:        The GeneXpert MRSA Assay (FDA approved for NASAL specimens only), is one component of a comprehensive MRSA colonization surveillance program. It is not intended to diagnose MRSA infection nor to guide or monitor treatment for MRSA infections.   Culture, respiratory (tracheal aspirate)     Status: None   Collection Time: 05/07/15  9:27 AM  Result Value Ref Range Status   Specimen Description TRACHEAL ASPIRATE  Final   Special Requests  NONE  Final   Gram Stain   Final    FEW WBC PRESENT, PREDOMINANTLY MONONUCLEAR RARE SQUAMOUS EPITHELIAL CELLS PRESENT MODERATE YEAST Performed at [facility]    Culture   Final    MODERATE YEAST CONSISTENT WITH CANDIDA SPECIES Performed at [facility]    Report Status 05/10/2015  FINAL  Final  Respiratory virus panel     Status: None   Collection Time: 05/07/15 10:56 AM  Result Value Ref Range Status   Source - RVPAN NASOPHARYNGEAL  Corrected   Respiratory Syncytial Virus A Negative Negative Final   Respiratory Syncytial Virus B Negative Negative Final   Influenza A Negative Negative Final   Influenza B Negative Negative Final   Parainfluenza 1 Negative Negative Final   Parainfluenza 2 Negative Negative Final   Parainfluenza 3 Negative Negative Final   Metapneumovirus Negative Negative Final   Rhinovirus Negative Negative Final   Adenovirus Negative Negative Final    Comment: (NOTE) Performed At: [facility] [address] [place], [place] [id number] Mila Homer MD WU:[id number]     Radiology Reports Dg Chest Port 1 View  05/10/2015   CLINICAL DATA:  Check endotracheal tube position  EXAM: PORTABLE CHEST - 1 VIEW  COMPARISON:  Radiograph 05/09/2015  FINDINGS: Endotrachea tube is 4 cm from carina. NG tube extends into the stomach. Interval improvement in aeration lung bases. Central venous congestion. No pneumothorax.  IMPRESSION: 1. Endotracheal tube appears in good position. 2. Increased aeration to lung bases.   Electronically Signed   By: Genevive Bi M.D.   On: 05/10/2015 16:48   Dg Chest Port 1 View  05/09/2015   CLINICAL DATA:  Acute respiratory failure  EXAM: PORTABLE CHEST - 1 VIEW  COMPARISON:  05/07/2015  FINDINGS: Cardiac shadow is stable. Patient is somewhat rotated to the right. The overall inspiratory effort is poor. No focal confluent infiltrate is seen. An endotracheal tube and nasogastric catheter are again seen and stable. No acute bony abnormality is noted.  IMPRESSION: Poor inspiratory effort without acute abnormality.   Electronically Signed   By: Alcide Clever M.D.   On: 05/09/2015 07:29   Dg Chest Port 1 View  05/07/2015   CLINICAL DATA:  Recent endotracheal readjustment  EXAM: PORTABLE CHEST - 1 VIEW  COMPARISON:   04/27/2015  FINDINGS: The endotracheal tube now lies approximately 2 cm above the carina. A nasogastric catheter is seen within the stomach. Cardiac shadow is stable. Persistent right basilar atelectasis is noted. No other focal abnormality is noted.  IMPRESSION: Stable right basilar atelectasis.  The endotracheal tube as described.   Electronically Signed   By: Alcide Clever M.D.   On: 05/07/2015 11:14   Dg Chest Port 1 View  05/07/2015   CLINICAL DATA:  Check endotracheal tube placement 05/07/2015  EXAM: PORTABLE CHEST - 1 VIEW  COMPARISON:  None.  FINDINGS: Endotracheal tube is again noted 12 mm above the carina and should be withdrawn 2 cm. A nasogastric catheter is noted within the stomach although the proximal side port lies in the distal esophagus. Elevation of the right hemidiaphragm with right basilar atelectasis is noted. The cardiac shadow is stable.  IMPRESSION: Endotracheal tube still 12 mm above the carina. This should be withdrawn approximately 2 cm.  Nasogastric catheter as described.  Increasing right basilar atelectasis.   Electronically Signed   By: Alcide Clever M.D.   On: 05/07/2015 09:06   Dg Chest Port 1 View  05/07/2015   CLINICAL  DATA:  Endotracheal tube placement.  Initial encounter.  EXAM: PORTABLE CHEST - 1 VIEW  COMPARISON:  Chest radiograph performed earlier today at 3:01 a.m.  FINDINGS: The patient's endotracheal tube is noted extending just below the carina, overlying the right mainstem bronchus. This could be retracted approximately 3 cm. The enteric tube is noted extending below the diaphragm, with the side port noted about the distal esophagus. This could be advanced 8 cm, as deemed clinically appropriate.  The lungs are hypoexpanded. Right perihilar airspace opacity may reflect atelectasis, with mild right-sided volume loss noted. No pleural effusion or pneumothorax is seen.  The cardiomediastinal silhouette is borderline normal in size. No acute osseous abnormalities  identified.  IMPRESSION: 1. Endotracheal tube seen ending just below the carina, overlying the right mainstem bronchus. This could be retracted approximately 3 cm. 2. Enteric tube seen extending below the diaphragm, with the side port about the distal esophagus. This could be advanced 8 cm, is deemed clinically appropriate. 3. Lungs hypoexpanded. Right perihilar airspace opacity may reflect atelectasis, with mild right-sided volume loss noted. These results were called by telephone at the time of interpretation on 05/07/2015 at 5:48 am to Dr. Loren Racer, who verbally acknowledged these results.   Electronically Signed   By: Roanna Raider M.D.   On: 05/07/2015 05:48   Dg Chest Port 1 View  05/07/2015   CLINICAL DATA:  Altered mental status, acute onset. Initial encounter.  EXAM: PORTABLE CHEST - 1 VIEW  COMPARISON:  Chest radiograph performed 04/08/2015  FINDINGS: The lungs remain hypoexpanded. Left basilar airspace opacity may reflect atelectasis or possibly mild pneumonia. No definite pleural effusion or pneumothorax is seen.  The cardiomediastinal silhouette is enlarged. No acute osseous abnormalities are identified.  IMPRESSION: Lungs remain hypoexpanded. Left basilar airspace opacity may reflect atelectasis or possibly mild pneumonia. Cardiomegaly noted.   Electronically Signed   By: Roanna Raider M.D.   On: 05/07/2015 03:44   Dg Abd Portable 1v  05/09/2015   CLINICAL DATA:  Ileus. Abdominal distention. Nasogastric tube placement  EXAM: PORTABLE ABDOMEN - 1 VIEW  COMPARISON:  05/07/2015  FINDINGS: A nasogastric tube remains in place with tip again seen overlying the expected location of the transverse duodenum. Generalized gaseous distention of colon is again demonstrated, consistent with mild ileus. No dilated small bowel loops demonstrated.  IMPRESSION: Nasogastric tube tip again seen overlying the expected location of the transverse duodenum.  Persistent colonic distention, suspicious for ileus.    Electronically Signed   By: Myles Rosenthal M.D.   On: 05/09/2015 15:33   Dg Abd Portable 1v  05/07/2015   CLINICAL DATA:  Orogastric tube position  EXAM: PORTABLE ABDOMEN - 1 VIEW  COMPARISON:  Chest radiograph same date  FINDINGS: Tip of orogastric tube terminates over the expected location of the third portion of the duodenum. Right upper quadrant clips are noted. Normal bowel gas pattern.  IMPRESSION: Feeding tube tip terminates over the expected location of the third portion of the duodenum.   Electronically Signed   By: Christiana Pellant M.D.   On: 05/07/2015 11:19     CBC  Recent Labs Lab 05/07/15 0257 05/07/15 1135 05/08/15 0232 05/10/15 0525 05/13/15 0620  WBC 10.0 8.1 7.0 7.9 7.7  HGB 11.5* 9.4* 9.1* 8.9* 10.2*  HCT 37.3 30.4* 29.8* 28.9* 31.1*  PLT 168 131* 120* 121* 121*  MCV 83.6 83.5 83.9 83.5 79.9  MCH 25.8* 25.8* 25.6* 25.7* 26.2  MCHC 30.8 30.9 30.5 30.8 32.8  RDW 17.6*  17.4* 17.2* 17.8* 17.1*  LYMPHSABS 2.1  --   --   --   --   MONOABS 1.1*  --   --   --   --   EOSABS 0.0  --   --   --   --   BASOSABS 0.0  --   --   --   --     Chemistries   Recent Labs Lab 05/07/15 0257 05/07/15 1135  05/09/15 0211  05/10/15 1843 05/11/15 0255 05/11/15 1523 05/12/15 0541 05/13/15 0620  NA 154*  --   < > 156*  < > 154* 154* 150* 148* 139  K 4.8  --   < > 2.7*  < > 5.0 3.8 4.0 3.3* 3.4*  CL 115*  --   < > 122*  < > 126* 126* 121* 119* 109  CO2 25  --   < > 25  < > 22 24 20* 22 24  GLUCOSE 388*  --   < > 347*  < > 188* 167* 154* 221* 215*  BUN 89*  --   < > 79*  < > 62* 59* 56* 38* 26*  CREATININE 3.28* 2.99*  < > 1.94*  < > 1.31* 1.22* 1.10* 1.03* 0.93  CALCIUM 8.9  --   < > 8.5*  < > 8.4* 8.5* 8.3* 8.5* 8.4*  MG  --  2.2  --  2.1  --   --   --   --   --  1.4*  AST 51*  --   --   --   --   --   --   --   --   --   ALT 18  --   --   --   --   --   --   --   --   --   ALKPHOS 61  --   --   --   --   --   --   --   --   --   BILITOT 0.7  --   --   --   --   --   --   --    --   --   < > = values in this interval not displayed. ------------------------------------------------------------------------------------------------------------------ estimated creatinine clearance is 62.2 mL/min (by C-G formula based on Cr of 0.93). ------------------------------------------------------------------------------------------------------------------ No results for input(s): HGBA1C in the last 72 hours. ------------------------------------------------------------------------------------------------------------------ No results for input(s): CHOL, HDL, LDLCALC, TRIG, CHOLHDL, LDLDIRECT in the last 72 hours. ------------------------------------------------------------------------------------------------------------------ No results for input(s): TSH, T4TOTAL, T3FREE, THYROIDAB in the last 72 hours.  Invalid input(s): FREET3 ------------------------------------------------------------------------------------------------------------------ No results for input(s): VITAMINB12, FOLATE, FERRITIN, TIBC, IRON, RETICCTPCT in the last 72 hours.  Coagulation profile  Recent Labs Lab 05/07/15 1135 05/12/15 1414  INR 1.46 1.12    No results for input(s): DDIMER in the last 72 hours.  Cardiac Enzymes No results for input(s): CKMB, TROPONINI, MYOGLOBIN in the last 168 hours.  Invalid input(s): CK ------------------------------------------------------------------------------------------------------------------ Invalid input(s): POCBNP   Time Spent in minutes  30   SINGH,PRASHANT K M.D on 05/13/2015 at 12:52 PM  Between 7am to 7pm - Pager - [phone]  After 7pm go to [email or web address] - password [facility]  [facility] -  Office  [phone]

## 2015-05-14 LAB — GLUCOSE, CAPILLARY
GLUCOSE-CAPILLARY: 159 mg/dL — AB (ref 65–99)
Glucose-Capillary: 154 mg/dL — ABNORMAL HIGH (ref 65–99)
Glucose-Capillary: 208 mg/dL — ABNORMAL HIGH (ref 65–99)
Glucose-Capillary: 166 mg/dL — ABNORMAL HIGH (ref 65–99)
Glucose-Capillary: 199 mg/dL — ABNORMAL HIGH (ref 65–99)

## 2015-05-14 LAB — BASIC METABOLIC PANEL
Anion gap: 6 (ref 5–15)
BUN: 21 mg/dL — ABNORMAL HIGH (ref 6–20)
CO2: 23 mmol/L (ref 22–32)
CREATININE: 1 mg/dL (ref 0.44–1.00)
Calcium: 8.7 mg/dL — ABNORMAL LOW (ref 8.9–10.3)
Chloride: 110 mmol/L (ref 101–111)
GFR calc Af Amer: >60 mL/min (ref >60)
GFR calc non Af Amer: 59 mL/min — ABNORMAL LOW (ref >60)
GLUCOSE: 169 mg/dL — AB (ref 65–99)
Potassium: 3.1 mmol/L — ABNORMAL LOW (ref 3.5–5.1)
Sodium: 139 mmol/L (ref 135–145)

## 2015-05-14 LAB — PROTIME-INR
INR: 1.08 (ref 0.00–1.49)
PROTHROMBIN TIME: 14.2 s (ref 11.6–15.2)

## 2015-05-14 MED ORDER — MAGNESIUM SULFATE 2 GM/50ML IV SOLN
2.0000 g | Freq: Once | INTRAVENOUS | Status: AC
  Administered 2015-05-14: 2 g via INTRAVENOUS
  Filled 2015-05-14: qty 50

## 2015-05-14 MED ORDER — POTASSIUM CHLORIDE 10 MEQ/100ML IV SOLN
10.0000 meq | INTRAVENOUS | Status: AC
  Administered 2015-05-14 (×4): 10 meq via INTRAVENOUS
  Filled 2015-05-14 (×3): qty 100

## 2015-05-14 NOTE — Progress Notes (Signed)
Patient ID: Valerie Rodriguez, female   DOB: 07-12-1951, 64 y.o.   MRN: [id number] Request received for perc gastrostomy tube placement on pt. Imaging reviewed and pt is candidate anatomically for tube. She is on plavix - this will need to be held for 5 days before tube can be placed. Will cont to monitor and proceed when appropriate if all parties are in agreement.

## 2015-05-14 NOTE — Plan of Care (Signed)
Problem: Phase I Progression Outcomes Goal: Voiding-avoid urinary catheter unless indicated Outcome: Not Applicable Date Met:  10/24/01 Pt has chronic foley

## 2015-05-14 NOTE — Progress Notes (Signed)
Patient Demographics:    Valerie Rodriguez, is a 64 y.o. female, DOB - 1951/02/27, ZOX:[id number]  Admit date - 05/07/2015   Admitting Physician Leslye Peer, MD  Outpatient Primary MD for the patient is No PCP Per Patient  LOS - 7   Chief Complaint  Patient presents with  . Altered Mental Status  . 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 Patient became less interactive and then was found unresponsive in her facility with O2 sats in 60-70s. Patient intubated.  Patient has a history of HTN, HLD, DM, stroke, CKD, PVD, and PAD who was in her usual state of health (communicative and interactive with sign language) until 2 months ago when she developed several bouts of PNA. The most recent one 2 weeks ago for which she was treated but over the last two weeks her daughter states she has been minimally interactive (if at all). 7/23 she was at her facility and she was found un responsive with O2 sats in the 60-70s. On arrival to the ED she was 80s on NRB mask. She was then intubated for hypoxemia and found to have thick tenatcious secretions and peri-intubation hypotension that resolved with fluid.  She was intubated and kept in ICU by pulmonary critical care team, she was extubated on 05/11/2015 in transfer to hospitalist service under my care on 05/12/2015. She is still minimally responsive with open her eyes to physical stimuli but will not respond to any commands. Continues to have severe generalized weakness. Continues to appear to be very high risk for aspiration.     Subjective:    Valerie Rodriguez today in bed, nonverbal, makes eye contact only, does not appear to be in any distress. Sign language interpreter helped.   Assessment  & Plan :       1. Recurrent acute on chronic hypoxic respiratory failure due to COPD exacerbation along with recurrent aspiration pneumonia - HCAP. Patient has had several bouts of recent pneumonia, this admission she required endotracheal intubation. She has extremely poor baseline quality of life with bedbound status, baseline deaf and mute, and now evidence of high risk for recurrent aspiration.  She has finished her steroids, she is now on nasal cannula oxygen after extubation, will stop all other anti-biotics and leave her only on Unasyn on 05/12/2015 Will treat for a total duration of 7 days as clinically much improved, continue nebulizer treatments, seen by speech therapy and underwent FEES study, placed on dysphagia 1 diet. However remains very high risk for aspiration in the future. Discussed with patient's daughter who is interested in a feeding tube, feeding tube will be placed as I think oral intake will remain questionable with her waxing and waning mentation and high risk for aspiration. Her feeding here has been extremely poor in the last 24 hours as well.  Daughter agreeable that if patient declines further she will focus on comfort patient is a DO NOT RESUSCITATE now.   2. Severe dehydration with acute renal failure and hypernatremia. Improved with D5W hydration.   3. Generalized weakness, deaf-mute. Extremely poor baseline quality of life. What really bedbound with minimal responsiveness per daughter at baseline.  PT, palliative care for goals of care.   4. Risk of aspiration. See #1 above.   5. Hypertension. Oral medications if able to tolerate, as needed IV hydralazine.   6. Hypokalemia and hypomagnesemia. Both replaced. Monitor.    7. DM type II. Currently on sliding scale along with Lantus continue.   Lab Results  Component Value Date   HGBA1C 9.4* 04/09/2015    CBG (last 3)   Recent Labs  05/13/15 2350 05/14/15 0340 05/14/15 0849  GLUCAP 156* 154* 159*      Code  Status : DO NOT RESUSCITATE  Family Communication  : Daughter over the phone  Disposition Plan  : SNF in a few days  Consults  :  Pulmonary critical care, palliative care  Procedures  :   Intubation upon admission and extubated 05/11/2015.  TTE done one month ago shows EF of 60% with chronic grade 1 compensated diastolic heart failure. No wall motion abnormality.    DVT Prophylaxis  :  Heparin   Lab Results  Component Value Date   PLT 121* 05/13/2015    Inpatient Medications  Scheduled Meds: . ampicillin-sulbactam (UNASYN) IV  1.5 g Intravenous Q6H  . antiseptic oral rinse  7 mL Mouth Rinse QID  . atenolol  50 mg Oral BID  . chlorhexidine  15 mL Mouth Rinse BID  . citalopram  20 mg Oral Daily  . clopidogrel  75 mg Oral Daily  . collagenase   Topical Daily  . heparin  5,000 Units Subcutaneous 3 times per day  . hydrALAZINE  25 mg Oral 3 times per day  . insulin aspart  0-20 Units Subcutaneous 6 times per day  . insulin glargine  20 Units Subcutaneous Daily  . latanoprost  1 drop Both Eyes QHS  . potassium chloride  10 mEq Intravenous Q1 Hr x 4  . pregabalin  75 mg Oral BID  . saccharomyces boulardii  250 mg Oral BID   Continuous Infusions:   PRN Meds:.sodium chloride, dextrose, hydrALAZINE, ipratropium-albuterol, labetalol  Antibiotics  :     Anti-infectives    Start     Dose/Rate Route Frequency Ordered Stop   05/12/15 1200  ampicillin-sulbactam (UNASYN) 1.5 g in sodium chloride 0.9 % 50 mL IVPB     1.5 g 100 mL/hr over 30 Minutes Intravenous Every 6 hours 05/12/15 1131     05/09/15 1530  cefTRIAXone (ROCEPHIN) 2 g in dextrose 5 % 50 mL IVPB - Premix  Status:  Discontinued     2 g 100 mL/hr over 30 Minutes Intravenous Every 24 hours 05/09/15 1447 05/12/15 1130   05/09/15 1000  vancomycin (VANCOCIN) IVPB 1000 mg/200 mL premix  Status:  Discontinued     1,000 mg 200 mL/hr over 60 Minutes Intravenous Every 48 hours 05/07/15 0857 05/08/15 0840   05/08/15 1400   piperacillin-tazobactam (ZOSYN) IVPB 3.375 g  Status:  Discontinued     3.375 g 12.5 mL/hr over 240 Minutes Intravenous 3 times per day 05/08/15 0840 05/09/15 1446   05/08/15 1100  vancomycin (VANCOCIN) IVPB 1000 mg/200 mL premix  Status:  Discontinued     1,000 mg 200 mL/hr over 60 Minutes Intravenous Every 24 hours 05/08/15 0840 05/09/15 1446   05/07/15 1200  piperacillin-tazobactam (ZOSYN) IVPB 2.25 g  Status:  Discontinued     2.25 g 100 mL/hr over 30 Minutes Intravenous Every 6 hours 05/07/15 0846 05/08/15 0840   05/07/15 0930  vancomycin (VANCOCIN) 500 mg in sodium chloride 0.9 % 100  mL IVPB     500 mg 100 mL/hr over 60 Minutes Intravenous  Once 05/07/15 0857 05/07/15 1121   05/07/15 0830  vancomycin (VANCOCIN) IVPB 750 mg/150 ml premix  Status:  Discontinued     750 mg 150 mL/hr over 60 Minutes Intravenous Every 12 hours 05/07/15 0819 05/07/15 0844   05/07/15 0830  piperacillin-tazobactam (ZOSYN) IVPB 3.375 g  Status:  Discontinued     3.375 g 12.5 mL/hr over 240 Minutes Intravenous 3 times per day 05/07/15 0819 05/07/15 0843   05/07/15 0315  piperacillin-tazobactam (ZOSYN) IVPB 3.375 g     3.375 g 100 mL/hr over 30 Minutes Intravenous  Once 05/07/15 0312 05/07/15 0423   05/07/15 0315  vancomycin (VANCOCIN) IVPB 1000 mg/200 mL premix     1,000 mg 200 mL/hr over 60 Minutes Intravenous  Once 05/07/15 0312 05/07/15 0519        Objective:   Filed Vitals:   05/13/15 0418 05/13/15 1502 05/13/15 2236 05/14/15 0532  BP: 140/57 133/49 150/47 130/57  Pulse: 58 57 57 57  Temp: 98 F (36.7 C) 98.3 F (36.8 C) 99 F (37.2 C) 98.8 F (37.1 C)  TempSrc: Oral Oral Oral Oral  Resp: Height:      Weight:      SpO2: 100% 99% 100% 100%    Wt Readings from Last 3 Encounters:  05/11/15 87.4 kg (192 lb 10.9 oz)  04/12/15 89 kg (196 lb 3.4 oz)  03/26/15 98.2 kg (216 lb 7.9 oz)     Intake/Output Summary (Last 24 hours) at 05/14/15 1039 Last data filed at 05/14/15 0900   Gross per 24 hour  Intake      2 ml  Output    800 ml  Net   -798 ml     Physical Exam  Awake , makes eye contact only, moves upper extremity minimally. [place].AT,PERRAL Supple Neck,No JVD, No cervical lymphadenopathy appriciated.  Symmetrical Chest wall movement, Good air movement bilaterally, CTAB RRR,No Gallops,Rubs or new Murmurs, No Parasternal Heave +ve B.Sounds, Abd Soft, No tenderness, No organomegaly appriciated, No rebound - guarding or rigidity. No Cyanosis, Clubbing or edema, No new Rash or bruise    Wearing splints in both legs    Data Review:   Micro Results Recent Results (from the past 240 hour(s))  Culture, blood (routine x 2)     Status: None   Collection Time: 05/07/15  2:57 AM  Result Value Ref Range Status   Specimen Description BLOOD LEFT HAND  Final   Special Requests BOTTLES DRAWN AEROBIC AND ANAEROBIC 5CC  Final   Culture NO GROWTH 5 DAYS  Final   Report Status 05/12/2015 FINAL  Final  Culture, blood (routine x 2)     Status: None   Collection Time: 05/07/15  3:02 AM  Result Value Ref Range Status   Specimen Description BLOOD RIGHT HAND  Final   Special Requests BOTTLES DRAWN AEROBIC AND ANAEROBIC 5CC  Final   Culture NO GROWTH 5 DAYS  Final   Report Status 05/12/2015 FINAL  Final  MRSA PCR Screening     Status: None   Collection Time: 05/07/15  8:16 AM  Result Value Ref Range Status   MRSA by PCR NEGATIVE NEGATIVE Final    Comment:        The GeneXpert MRSA Assay (FDA approved for NASAL specimens only), is one component of a comprehensive MRSA colonization surveillance program. It is not intended to diagnose MRSA infection nor  to guide or monitor treatment for MRSA infections.   Culture, respiratory (tracheal aspirate)     Status: None   Collection Time: 05/07/15  9:27 AM  Result Value Ref Range Status   Specimen Description TRACHEAL ASPIRATE  Final   Special Requests NONE  Final   Gram Stain   Final    FEW WBC PRESENT, PREDOMINANTLY  MONONUCLEAR RARE SQUAMOUS EPITHELIAL CELLS PRESENT MODERATE YEAST Performed at [facility]    Culture   Final    MODERATE YEAST CONSISTENT WITH CANDIDA SPECIES Performed at [facility]    Report Status 05/10/2015 FINAL  Final  Respiratory virus panel     Status: None   Collection Time: 05/07/15 10:56 AM  Result Value Ref Range Status   Source - RVPAN NASOPHARYNGEAL  Corrected   Respiratory Syncytial Virus A Negative Negative Final   Respiratory Syncytial Virus B Negative Negative Final   Influenza A Negative Negative Final   Influenza B Negative Negative Final   Parainfluenza 1 Negative Negative Final   Parainfluenza 2 Negative Negative Final   Parainfluenza 3 Negative Negative Final   Metapneumovirus Negative Negative Final   Rhinovirus Negative Negative Final   Adenovirus Negative Negative Final    Comment: (NOTE) Performed At: [facility] [address] [place], [place] [id number] Mila Homer MD WU:[id number]     Radiology Reports Dg Chest Port 1 View  05/10/2015   CLINICAL DATA:  Check endotracheal tube position  EXAM: PORTABLE CHEST - 1 VIEW  COMPARISON:  Radiograph 05/09/2015  FINDINGS: Endotrachea tube is 4 cm from carina. NG tube extends into the stomach. Interval improvement in aeration lung bases. Central venous congestion. No pneumothorax.  IMPRESSION: 1. Endotracheal tube appears in good position. 2. Increased aeration to lung bases.   Electronically Signed   By: Genevive Bi M.D.   On: 05/10/2015 16:48   Dg Chest Port 1 View  05/09/2015   CLINICAL DATA:  Acute respiratory failure  EXAM: PORTABLE CHEST - 1 VIEW  COMPARISON:  05/07/2015  FINDINGS: Cardiac shadow is stable. Patient is somewhat rotated to the right. The overall inspiratory effort is poor. No focal confluent infiltrate is seen. An endotracheal tube and nasogastric catheter are again seen and stable. No acute bony abnormality is noted.  IMPRESSION: Poor inspiratory effort  without acute abnormality.   Electronically Signed   By: Alcide Clever M.D.   On: 05/09/2015 07:29   Dg Chest Port 1 View  05/07/2015   CLINICAL DATA:  Recent endotracheal readjustment  EXAM: PORTABLE CHEST - 1 VIEW  COMPARISON:  04/27/2015  FINDINGS: The endotracheal tube now lies approximately 2 cm above the carina. A nasogastric catheter is seen within the stomach. Cardiac shadow is stable. Persistent right basilar atelectasis is noted. No other focal abnormality is noted.  IMPRESSION: Stable right basilar atelectasis.  The endotracheal tube as described.   Electronically Signed   By: Alcide Clever M.D.   On: 05/07/2015 11:14   Dg Chest Port 1 View  05/07/2015   CLINICAL DATA:  Check endotracheal tube placement 05/07/2015  EXAM: PORTABLE CHEST - 1 VIEW  COMPARISON:  None.  FINDINGS: Endotracheal tube is again noted 12 mm above the carina and should be withdrawn 2 cm. A nasogastric catheter is noted within the stomach although the proximal side port lies in the distal esophagus. Elevation of the right hemidiaphragm with right basilar atelectasis is noted. The cardiac shadow is stable.  IMPRESSION: Endotracheal tube still 12 mm above the  carina. This should be withdrawn approximately 2 cm.  Nasogastric catheter as described.  Increasing right basilar atelectasis.   Electronically Signed   By: Alcide Clever M.D.   On: 05/07/2015 09:06   Dg Chest Port 1 View  05/07/2015   CLINICAL DATA:  Endotracheal tube placement.  Initial encounter.  EXAM: PORTABLE CHEST - 1 VIEW  COMPARISON:  Chest radiograph performed earlier today at 3:01 a.m.  FINDINGS: The patient's endotracheal tube is noted extending just below the carina, overlying the right mainstem bronchus. This could be retracted approximately 3 cm. The enteric tube is noted extending below the diaphragm, with the side port noted about the distal esophagus. This could be advanced 8 cm, as deemed clinically appropriate.  The lungs are hypoexpanded. Right perihilar  airspace opacity may reflect atelectasis, with mild right-sided volume loss noted. No pleural effusion or pneumothorax is seen.  The cardiomediastinal silhouette is borderline normal in size. No acute osseous abnormalities identified.  IMPRESSION: 1. Endotracheal tube seen ending just below the carina, overlying the right mainstem bronchus. This could be retracted approximately 3 cm. 2. Enteric tube seen extending below the diaphragm, with the side port about the distal esophagus. This could be advanced 8 cm, is deemed clinically appropriate. 3. Lungs hypoexpanded. Right perihilar airspace opacity may reflect atelectasis, with mild right-sided volume loss noted. These results were called by telephone at the time of interpretation on 05/07/2015 at 5:48 am to Dr. Loren Racer, who verbally acknowledged these results.   Electronically Signed   By: Roanna Raider M.D.   On: 05/07/2015 05:48   Dg Chest Port 1 View  05/07/2015   CLINICAL DATA:  Altered mental status, acute onset. Initial encounter.  EXAM: PORTABLE CHEST - 1 VIEW  COMPARISON:  Chest radiograph performed 04/08/2015  FINDINGS: The lungs remain hypoexpanded. Left basilar airspace opacity may reflect atelectasis or possibly mild pneumonia. No definite pleural effusion or pneumothorax is seen.  The cardiomediastinal silhouette is enlarged. No acute osseous abnormalities are identified.  IMPRESSION: Lungs remain hypoexpanded. Left basilar airspace opacity may reflect atelectasis or possibly mild pneumonia. Cardiomegaly noted.   Electronically Signed   By: Roanna Raider M.D.   On: 05/07/2015 03:44   Dg Abd Portable 1v  05/09/2015   CLINICAL DATA:  Ileus. Abdominal distention. Nasogastric tube placement  EXAM: PORTABLE ABDOMEN - 1 VIEW  COMPARISON:  05/07/2015  FINDINGS: A nasogastric tube remains in place with tip again seen overlying the expected location of the transverse duodenum. Generalized gaseous distention of colon is again demonstrated,  consistent with mild ileus. No dilated small bowel loops demonstrated.  IMPRESSION: Nasogastric tube tip again seen overlying the expected location of the transverse duodenum.  Persistent colonic distention, suspicious for ileus.   Electronically Signed   By: Myles Rosenthal M.D.   On: 05/09/2015 15:33   Dg Abd Portable 1v  05/07/2015   CLINICAL DATA:  Orogastric tube position  EXAM: PORTABLE ABDOMEN - 1 VIEW  COMPARISON:  Chest radiograph same date  FINDINGS: Tip of orogastric tube terminates over the expected location of the third portion of the duodenum. Right upper quadrant clips are noted. Normal bowel gas pattern.  IMPRESSION: Feeding tube tip terminates over the expected location of the third portion of the duodenum.   Electronically Signed   By: Christiana Pellant M.D.   On: 05/07/2015 11:19     CBC  Recent Labs Lab 05/07/15 1135 05/08/15 0232 05/10/15 0525 05/13/15 0620  WBC 8.1 7.0 7.9 7.7  HGB  9.4* 9.1* 8.9* 10.2*  HCT 30.4* 29.8* 28.9* 31.1*  PLT 131* 120* 121* 121*  MCV 83.5 83.9 83.5 79.9  MCH 25.8* 25.6* 25.7* 26.2  MCHC 30.9 30.5 30.8 32.8  RDW 17.4* 17.2* 17.8* 17.1*    Chemistries   Recent Labs Lab 05/07/15 1135  05/09/15 0211  05/11/15 0255 05/11/15 1523 05/12/15 0541 05/13/15 0620 05/14/15 0427  NA  --   < > 156*  < > 154* 150* 148* 139 139  K  --   < > 2.7*  < > 3.8 4.0 3.3* 3.4* 3.1*  CL  --   < > 122*  < > 126* 121* 119* 109 110  CO2  --   < > 25  < > 24 20* 22 24 23   GLUCOSE  --   < > 347*  < > 167* 154* 221* 215* 169*  BUN  --   < > 79*  < > 59* 56* 38* 26* 21*  CREATININE 2.99*  < > 1.94*  < > 1.22* 1.10* 1.03* 0.93 1.00  CALCIUM  --   < > 8.5*  < > 8.5* 8.3* 8.5* 8.4* 8.7*  MG 2.2  --  2.1  --   --   --   --  1.4*  --   < > = values in this interval not displayed. ------------------------------------------------------------------------------------------------------------------ estimated creatinine clearance is 57.8 mL/min (by C-G formula based on Cr  of 1). ------------------------------------------------------------------------------------------------------------------ No results for input(s): HGBA1C in the last 72 hours. ------------------------------------------------------------------------------------------------------------------ No results for input(s): CHOL, HDL, LDLCALC, TRIG, CHOLHDL, LDLDIRECT in the last 72 hours. ------------------------------------------------------------------------------------------------------------------ No results for input(s): TSH, T4TOTAL, T3FREE, THYROIDAB in the last 72 hours.  Invalid input(s): FREET3 ------------------------------------------------------------------------------------------------------------------ No results for input(s): VITAMINB12, FOLATE, FERRITIN, TIBC, IRON, RETICCTPCT in the last 72 hours.  Coagulation profile  Recent Labs Lab 05/07/15 1135 05/12/15 1414 05/14/15 0759  INR 1.46 1.12 1.08    No results for input(s): DDIMER in the last 72 hours.  Cardiac Enzymes No results for input(s): CKMB, TROPONINI, MYOGLOBIN in the last 168 hours.  Invalid input(s): CK ------------------------------------------------------------------------------------------------------------------ Invalid input(s): POCBNP   Time Spent in minutes  30   SINGH,PRASHANT K M.D on 05/14/2015 at 10:39 AM  Between 7am to 7pm - Pager - [phone]  After 7pm go to [email or web address] - password [facility]  [facility] -  Office  [phone]

## 2015-05-15 LAB — CBC
HCT: 29 % — ABNORMAL LOW (ref 36.0–46.0)
Hemoglobin: 9.3 g/dL — ABNORMAL LOW (ref 12.0–15.0)
MCH: 25.6 pg — AB (ref 26.0–34.0)
MCHC: 32.1 g/dL (ref 30.0–36.0)
MCV: 79.9 fL (ref 78.0–100.0)
PLATELETS: 182 10*3/uL (ref 150–400)
RBC: 3.63 MIL/uL — AB (ref 3.87–5.11)
RDW: 17.4 % — ABNORMAL HIGH (ref 11.5–15.5)
WBC: 7.9 10*3/uL (ref 4.0–10.5)

## 2015-05-15 LAB — BASIC METABOLIC PANEL
ANION GAP: 8 (ref 5–15)
BUN: 19 mg/dL (ref 6–20)
CO2: 23 mmol/L (ref 22–32)
Calcium: 9 mg/dL (ref 8.9–10.3)
Chloride: 113 mmol/L — ABNORMAL HIGH (ref 101–111)
Creatinine, Ser: 1.11 mg/dL — ABNORMAL HIGH (ref 0.44–1.00)
GFR calc Af Amer: 60 mL/min — ABNORMAL LOW (ref >60)
GFR, EST NON AFRICAN AMERICAN: 52 mL/min — AB (ref >60)
GLUCOSE: 179 mg/dL — AB (ref 65–99)
POTASSIUM: 3.5 mmol/L (ref 3.5–5.1)
SODIUM: 144 mmol/L (ref 135–145)

## 2015-05-15 LAB — GLUCOSE, CAPILLARY
GLUCOSE-CAPILLARY: 181 mg/dL — AB (ref 65–99)
GLUCOSE-CAPILLARY: 155 mg/dL — AB (ref 65–99)
GLUCOSE-CAPILLARY: 283 mg/dL — AB (ref 65–99)
GLUCOSE-CAPILLARY: 313 mg/dL — AB (ref 65–99)
Glucose-Capillary: 249 mg/dL — ABNORMAL HIGH (ref 65–99)
Glucose-Capillary: 251 mg/dL — ABNORMAL HIGH (ref 65–99)

## 2015-05-15 LAB — MAGNESIUM: Magnesium: 2 mg/dL (ref 1.7–2.4)

## 2015-05-15 MED ORDER — DEXTROSE-NACL 5-0.45 % IV SOLN
INTRAVENOUS | Status: DC
  Administered 2015-05-15: 14:00:00 via INTRAVENOUS

## 2015-05-15 MED ORDER — ATENOLOL 12.5 MG HALF TABLET
12.5000 mg | ORAL_TABLET | Freq: Once | ORAL | Status: AC
  Administered 2015-05-15: 12.5 mg via ORAL
  Filled 2015-05-15: qty 1

## 2015-05-15 MED ORDER — HYDROCODONE-ACETAMINOPHEN 5-325 MG PO TABS
1.0000 | ORAL_TABLET | Freq: Four times a day (QID) | ORAL | Status: DC
  Administered 2015-05-15 – 2015-05-21 (×19): 1 via ORAL
  Filled 2015-05-15 (×22): qty 1

## 2015-05-15 MED ORDER — CLOPIDOGREL BISULFATE 75 MG PO TABS
75.0000 mg | ORAL_TABLET | Freq: Every day | ORAL | Status: DC
  Administered 2015-05-21: 75 mg via ORAL
  Filled 2015-05-15 (×2): qty 1

## 2015-05-15 NOTE — Progress Notes (Signed)
Patient Demographics:    Valerie Rodriguez, is a 64 y.o. female, DOB - [birth year], ZOX:[id number]  Admit date - 05/07/2015   Admitting Physician Leslye Peer, MD  Outpatient Primary MD for the patient is No PCP Per Patient  LOS - 8   Chief Complaint  Patient presents with  . Altered Mental Status  . 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 Patient became less interactive and then was found unresponsive in her facility with O2 sats in 60-70s. Patient intubated.  Patient has a history of HTN, HLD, DM, stroke, CKD, PVD, and PAD who was in her usual state of health (communicative and interactive with sign language) until 2 months ago when she developed several bouts of PNA. The most recent one 2 weeks ago for which she was treated but over the last two weeks her daughter states she has been minimally interactive (if at all). 7/23 she was at her facility and she was found un responsive with O2 sats in the 60-70s. On arrival to the ED she was 80s on NRB mask. She was then intubated for hypoxemia and found to have thick tenatcious secretions and peri-intubation hypotension that resolved with fluid.  She was intubated and kept in ICU by pulmonary critical care team, she was extubated on 05/11/2015 in transfer to hospitalist service under my care on 05/12/2015. She is still minimally responsive with open her eyes to physical stimuli but will not respond to any commands. Continues to have severe generalized weakness. Continues to appear to be very high risk for aspiration.     Subjective:    Valerie Rodriguez today in bed, nonverbal, makes eye contact only, does not appear to be in any distress. Sign language interpreter helped. Her mentation seems to be vexing  and waning.   Assessment  & Plan :     1. Recurrent acute on chronic hypoxic respiratory failure due to COPD exacerbation along with recurrent aspiration pneumonia - HCAP. Patient has had several bouts of recent pneumonia, this admission she required endotracheal intubation. She has extremely poor baseline quality of life with bedbound status, baseline deaf and mute, and now evidence of high risk for recurrent aspiration.  She has finished her steroids, she is now on nasal cannula oxygen after extubation, will stop all other anti-biotics and leave her only on Unasyn on 05/12/2015 Will treat for a total duration of 7 days as clinically much improved, continue nebulizer treatments, seen by speech therapy and underwent FEES study, placed on dysphagia 1 diet. However remains very high risk for aspiration in the future.   Discussed with patient's daughter who is interested in a feeding tube, feeding tube will be placed as I think oral intake will remain questionable with her waxing and waning mentation and high risk for aspiration. Her feeding here has been extremely poor in the last 24 hours as well. I are has been consulted. I are wants her to be Plavix free for 5 days, Plavix on hold, last dose was 05/14/2015.  Daughter agreeable that if patient declines further she will focus on comfort patient is a DO NOT RESUSCITATE now.   2. Severe dehydration  with acute renal failure and hypernatremia. Improved with D5W hydration.   3. Generalized weakness, deaf-mute. Extremely poor baseline quality of life. What really bedbound with minimal responsiveness per daughter at baseline. PT, palliative care for goals of care.   4. Risk of aspiration. See #1 above.   5. Hypertension. Oral medications if able to tolerate, as needed IV hydralazine.   6. Hypokalemia and hypomagnesemia. Both replaced. Monitor.    7. DM type II. Currently on sliding scale along with Lantus continue.   Lab Results  Component  Value Date   HGBA1C 9.4* 04/09/2015    CBG (last 3)   Recent Labs  05/15/15 0025 05/15/15 0445 05/15/15 0821  GLUCAP 249* 181* 155*      Code Status : DO NOT RESUSCITATE  Family Communication  : Daughter over the phone  Disposition Plan  : SNF in a few days  Consults  :  Pulmonary critical care, palliative care  Procedures  :   Intubation upon admission and extubated 05/11/2015.  TTE done one month ago shows EF of 60% with chronic grade 1 compensated diastolic heart failure. No wall motion abnormality.    DVT Prophylaxis  :  Heparin   Lab Results  Component Value Date   PLT 182 05/15/2015    Inpatient Medications  Scheduled Meds: . ampicillin-sulbactam (UNASYN) IV  1.5 g Intravenous Q6H  . antiseptic oral rinse  7 mL Mouth Rinse QID  . atenolol  50 mg Oral BID  . chlorhexidine  15 mL Mouth Rinse BID  . citalopram  20 mg Oral Daily  . [START ON 05/21/2015] clopidogrel  75 mg Oral Daily  . collagenase   Topical Daily  . heparin  5,000 Units Subcutaneous 3 times per day  . hydrALAZINE  25 mg Oral 3 times per day  . insulin aspart  0-20 Units Subcutaneous 6 times per day  . insulin glargine  20 Units Subcutaneous Daily  . latanoprost  1 drop Both Eyes QHS  . pregabalin  75 mg Oral BID  . saccharomyces boulardii  250 mg Oral BID   Continuous Infusions:   PRN Meds:.sodium chloride, dextrose, hydrALAZINE, ipratropium-albuterol, labetalol  Antibiotics  :     Anti-infectives    Start     Dose/Rate Route Frequency Ordered Stop   05/12/15 1200  ampicillin-sulbactam (UNASYN) 1.5 g in sodium chloride 0.9 % 50 mL IVPB     1.5 g 100 mL/hr over 30 Minutes Intravenous Every 6 hours 05/12/15 1131     05/09/15 1530  cefTRIAXone (ROCEPHIN) 2 g in dextrose 5 % 50 mL IVPB - Premix  Status:  Discontinued     2 g 100 mL/hr over 30 Minutes Intravenous Every 24 hours 05/09/15 1447 05/12/15 1130   05/09/15 1000  vancomycin (VANCOCIN) IVPB 1000 mg/200 mL premix  Status:   Discontinued     1,000 mg 200 mL/hr over 60 Minutes Intravenous Every 48 hours 05/07/15 0857 05/08/15 0840   05/08/15 1400  piperacillin-tazobactam (ZOSYN) IVPB 3.375 g  Status:  Discontinued     3.375 g 12.5 mL/hr over 240 Minutes Intravenous 3 times per day 05/08/15 0840 05/09/15 1446   05/08/15 1100  vancomycin (VANCOCIN) IVPB 1000 mg/200 mL premix  Status:  Discontinued     1,000 mg 200 mL/hr over 60 Minutes Intravenous Every 24 hours 05/08/15 0840 05/09/15 1446   05/07/15 1200  piperacillin-tazobactam (ZOSYN) IVPB 2.25 g  Status:  Discontinued     2.25 g 100 mL/hr over 30 Minutes  Intravenous Every 6 hours 05/07/15 0846 05/08/15 0840   05/07/15 0930  vancomycin (VANCOCIN) 500 mg in sodium chloride 0.9 % 100 mL IVPB     500 mg 100 mL/hr over 60 Minutes Intravenous  Once 05/07/15 0857 05/07/15 1121   05/07/15 0830  vancomycin (VANCOCIN) IVPB 750 mg/150 ml premix  Status:  Discontinued     750 mg 150 mL/hr over 60 Minutes Intravenous Every 12 hours 05/07/15 0819 05/07/15 0844   05/07/15 0830  piperacillin-tazobactam (ZOSYN) IVPB 3.375 g  Status:  Discontinued     3.375 g 12.5 mL/hr over 240 Minutes Intravenous 3 times per day 05/07/15 0819 05/07/15 0843   05/07/15 0315  piperacillin-tazobactam (ZOSYN) IVPB 3.375 g     3.375 g 100 mL/hr over 30 Minutes Intravenous  Once 05/07/15 0312 05/07/15 0423   05/07/15 0315  vancomycin (VANCOCIN) IVPB 1000 mg/200 mL premix     1,000 mg 200 mL/hr over 60 Minutes Intravenous  Once 05/07/15 0312 05/07/15 0519        Objective:   Filed Vitals:   05/14/15 1600 05/14/15 2114 05/14/15 2302 05/15/15 0444  BP: 135/55 147/49 154/59 132/47  Pulse: 61 58 66 69  Temp: 98.5 F (36.9 C) 98.6 F (37 C)  98.2 F (36.8 C)  TempSrc: Oral Oral  Oral  Resp: 16 18  18   Height:      Weight:      SpO2: 100% 100%  100%    Wt Readings from Last 3 Encounters:  05/11/15 87.4 kg (192 lb 10.9 oz)  04/12/15 89 kg (196 lb 3.4 oz)  03/26/15 98.2 kg (216 lb 7.9  oz)     Intake/Output Summary (Last 24 hours) at 05/15/15 1006 Last data filed at 05/15/15 0608  Gross per 24 hour  Intake    220 ml  Output    600 ml  Net   -380 ml     Physical Exam  Awake , makes eye contact only, moves upper extremity minimally. [place].AT,PERRAL Supple Neck,No JVD, No cervical lymphadenopathy appriciated.  Symmetrical Chest wall movement, Good air movement bilaterally, CTAB RRR,No Gallops,Rubs or new Murmurs, No Parasternal Heave +ve B.Sounds, Abd Soft, No tenderness, No organomegaly appriciated, No rebound - guarding or rigidity. No Cyanosis, Clubbing or edema, No new Rash or bruise    Wearing splints in both legs    Data Review:   Micro Results Recent Results (from the past 240 hour(s))  Culture, blood (routine x 2)     Status: None   Collection Time: 05/07/15  2:57 AM  Result Value Ref Range Status   Specimen Description BLOOD LEFT HAND  Final   Special Requests BOTTLES DRAWN AEROBIC AND ANAEROBIC 5CC  Final   Culture NO GROWTH 5 DAYS  Final   Report Status 05/12/2015 FINAL  Final  Culture, blood (routine x 2)     Status: None   Collection Time: 05/07/15  3:02 AM  Result Value Ref Range Status   Specimen Description BLOOD RIGHT HAND  Final   Special Requests BOTTLES DRAWN AEROBIC AND ANAEROBIC 5CC  Final   Culture NO GROWTH 5 DAYS  Final   Report Status 05/12/2015 FINAL  Final  MRSA PCR Screening     Status: None   Collection Time: 05/07/15  8:16 AM  Result Value Ref Range Status   MRSA by PCR NEGATIVE NEGATIVE Final    Comment:        The GeneXpert MRSA Assay (FDA approved for NASAL specimens only), is  one component of a comprehensive MRSA colonization surveillance program. It is not intended to diagnose MRSA infection nor to guide or monitor treatment for MRSA infections.   Culture, respiratory (tracheal aspirate)     Status: None   Collection Time: 05/07/15  9:27 AM  Result Value Ref Range Status   Specimen Description TRACHEAL  ASPIRATE  Final   Special Requests NONE  Final   Gram Stain   Final    FEW WBC PRESENT, PREDOMINANTLY MONONUCLEAR RARE SQUAMOUS EPITHELIAL CELLS PRESENT MODERATE YEAST Performed at [facility]    Culture   Final    MODERATE YEAST CONSISTENT WITH CANDIDA SPECIES Performed at [facility]    Report Status 05/10/2015 FINAL  Final  Respiratory virus panel     Status: None   Collection Time: 05/07/15 10:56 AM  Result Value Ref Range Status   Source - RVPAN NASOPHARYNGEAL  Corrected   Respiratory Syncytial Virus A Negative Negative Final   Respiratory Syncytial Virus B Negative Negative Final   Influenza A Negative Negative Final   Influenza B Negative Negative Final   Parainfluenza 1 Negative Negative Final   Parainfluenza 2 Negative Negative Final   Parainfluenza 3 Negative Negative Final   Metapneumovirus Negative Negative Final   Rhinovirus Negative Negative Final   Adenovirus Negative Negative Final    Comment: (NOTE) Performed At: [facility] [address] [place], [place] [id number] Mila Homer MD NW:[id number]     Radiology Reports Dg Chest Port 1 View  05/10/2015   CLINICAL DATA:  Check endotracheal tube position  EXAM: PORTABLE CHEST - 1 VIEW  COMPARISON:  Radiograph 05/09/2015  FINDINGS: Endotrachea tube is 4 cm from carina. NG tube extends into the stomach. Interval improvement in aeration lung bases. Central venous congestion. No pneumothorax.  IMPRESSION: 1. Endotracheal tube appears in good position. 2. Increased aeration to lung bases.   Electronically Signed   By: Genevive Bi M.D.   On: 05/10/2015 16:48   Dg Chest Port 1 View  05/09/2015   CLINICAL DATA:  Acute respiratory failure  EXAM: PORTABLE CHEST - 1 VIEW  COMPARISON:  05/07/2015  FINDINGS: Cardiac shadow is stable. Patient is somewhat rotated to the right. The overall inspiratory effort is poor. No focal confluent infiltrate is seen. An endotracheal tube and nasogastric  catheter are again seen and stable. No acute bony abnormality is noted.  IMPRESSION: Poor inspiratory effort without acute abnormality.   Electronically Signed   By: Alcide Clever M.D.   On: 05/09/2015 07:29   Dg Chest Port 1 View  05/07/2015   CLINICAL DATA:  Recent endotracheal readjustment  EXAM: PORTABLE CHEST - 1 VIEW  COMPARISON:  04/27/2015  FINDINGS: The endotracheal tube now lies approximately 2 cm above the carina. A nasogastric catheter is seen within the stomach. Cardiac shadow is stable. Persistent right basilar atelectasis is noted. No other focal abnormality is noted.  IMPRESSION: Stable right basilar atelectasis.  The endotracheal tube as described.   Electronically Signed   By: Alcide Clever M.D.   On: 05/07/2015 11:14   Dg Chest Port 1 View  05/07/2015   CLINICAL DATA:  Check endotracheal tube placement 05/07/2015  EXAM: PORTABLE CHEST - 1 VIEW  COMPARISON:  None.  FINDINGS: Endotracheal tube is again noted 12 mm above the carina and should be withdrawn 2 cm. A nasogastric catheter is noted within the stomach although the proximal side port lies in the distal esophagus. Elevation of the right hemidiaphragm with right  basilar atelectasis is noted. The cardiac shadow is stable.  IMPRESSION: Endotracheal tube still 12 mm above the carina. This should be withdrawn approximately 2 cm.  Nasogastric catheter as described.  Increasing right basilar atelectasis.   Electronically Signed   By: Alcide Clever M.D.   On: 05/07/2015 09:06   Dg Chest Port 1 View  05/07/2015   CLINICAL DATA:  Endotracheal tube placement.  Initial encounter.  EXAM: PORTABLE CHEST - 1 VIEW  COMPARISON:  Chest radiograph performed earlier today at 3:01 a.m.  FINDINGS: The patient's endotracheal tube is noted extending just below the carina, overlying the right mainstem bronchus. This could be retracted approximately 3 cm. The enteric tube is noted extending below the diaphragm, with the side port noted about the distal  esophagus. This could be advanced 8 cm, as deemed clinically appropriate.  The lungs are hypoexpanded. Right perihilar airspace opacity may reflect atelectasis, with mild right-sided volume loss noted. No pleural effusion or pneumothorax is seen.  The cardiomediastinal silhouette is borderline normal in size. No acute osseous abnormalities identified.  IMPRESSION: 1. Endotracheal tube seen ending just below the carina, overlying the right mainstem bronchus. This could be retracted approximately 3 cm. 2. Enteric tube seen extending below the diaphragm, with the side port about the distal esophagus. This could be advanced 8 cm, is deemed clinically appropriate. 3. Lungs hypoexpanded. Right perihilar airspace opacity may reflect atelectasis, with mild right-sided volume loss noted. These results were called by telephone at the time of interpretation on 05/07/2015 at 5:48 am to Dr. Loren Racer, who verbally acknowledged these results.   Electronically Signed   By: Roanna Raider M.D.   On: 05/07/2015 05:48   Dg Chest Port 1 View  05/07/2015   CLINICAL DATA:  Altered mental status, acute onset. Initial encounter.  EXAM: PORTABLE CHEST - 1 VIEW  COMPARISON:  Chest radiograph performed 04/08/2015  FINDINGS: The lungs remain hypoexpanded. Left basilar airspace opacity may reflect atelectasis or possibly mild pneumonia. No definite pleural effusion or pneumothorax is seen.  The cardiomediastinal silhouette is enlarged. No acute osseous abnormalities are identified.  IMPRESSION: Lungs remain hypoexpanded. Left basilar airspace opacity may reflect atelectasis or possibly mild pneumonia. Cardiomegaly noted.   Electronically Signed   By: Roanna Raider M.D.   On: 05/07/2015 03:44   Dg Abd Portable 1v  05/09/2015   CLINICAL DATA:  Ileus. Abdominal distention. Nasogastric tube placement  EXAM: PORTABLE ABDOMEN - 1 VIEW  COMPARISON:  05/07/2015  FINDINGS: A nasogastric tube remains in place with tip again seen overlying  the expected location of the transverse duodenum. Generalized gaseous distention of colon is again demonstrated, consistent with mild ileus. No dilated small bowel loops demonstrated.  IMPRESSION: Nasogastric tube tip again seen overlying the expected location of the transverse duodenum.  Persistent colonic distention, suspicious for ileus.   Electronically Signed   By: Myles Rosenthal M.D.   On: 05/09/2015 15:33   Dg Abd Portable 1v  05/07/2015   CLINICAL DATA:  Orogastric tube position  EXAM: PORTABLE ABDOMEN - 1 VIEW  COMPARISON:  Chest radiograph same date  FINDINGS: Tip of orogastric tube terminates over the expected location of the third portion of the duodenum. Right upper quadrant clips are noted. Normal bowel gas pattern.  IMPRESSION: Feeding tube tip terminates over the expected location of the third portion of the duodenum.   Electronically Signed   By: Christiana Pellant M.D.   On: 05/07/2015 11:19     CBC  Recent  Labs Lab 05/10/15 0525 05/13/15 0620 05/15/15 0619  WBC 7.9 7.7 7.9  HGB 8.9* 10.2* 9.3*  HCT 28.9* 31.1* 29.0*  PLT 121* 121* 182  MCV 83.5 79.9 79.9  MCH 25.7* 26.2 25.6*  MCHC 30.8 32.8 32.1  RDW 17.8* 17.1* 17.4*    Chemistries   Recent Labs Lab 05/09/15 0211  05/11/15 1523 05/12/15 0541 05/13/15 0620 05/14/15 0427 05/15/15 0619  NA 156*  < > 150* 148* 139 139 144  K 2.7*  < > 4.0 3.3* 3.4* 3.1* 3.5  CL 122*  < > 121* 119* 109 110 113*  CO2 25  < > 20* 22 24 23 23   GLUCOSE 347*  < > 154* 221* 215* 169* 179*  BUN 79*  < > 56* 38* 26* 21* 19  CREATININE 1.94*  < > 1.10* 1.03* 0.93 1.00 1.11*  CALCIUM 8.5*  < > 8.3* 8.5* 8.4* 8.7* 9.0  MG 2.1  --   --   --  1.4*  --  2.0  < > = values in this interval not displayed. ------------------------------------------------------------------------------------------------------------------ estimated creatinine clearance is 52.1 mL/min (by C-G formula based on Cr of  1.11). ------------------------------------------------------------------------------------------------------------------ No results for input(s): HGBA1C in the last 72 hours. ------------------------------------------------------------------------------------------------------------------ No results for input(s): CHOL, HDL, LDLCALC, TRIG, CHOLHDL, LDLDIRECT in the last 72 hours. ------------------------------------------------------------------------------------------------------------------ No results for input(s): TSH, T4TOTAL, T3FREE, THYROIDAB in the last 72 hours.  Invalid input(s): FREET3 ------------------------------------------------------------------------------------------------------------------ No results for input(s): VITAMINB12, FOLATE, FERRITIN, TIBC, IRON, RETICCTPCT in the last 72 hours.  Coagulation profile  Recent Labs Lab 05/12/15 1414 05/14/15 0759  INR 1.12 1.08    No results for input(s): DDIMER in the last 72 hours.  Cardiac Enzymes No results for input(s): CKMB, TROPONINI, MYOGLOBIN in the last 168 hours.  Invalid input(s): CK ------------------------------------------------------------------------------------------------------------------ Invalid input(s): POCBNP   Time Spent in minutes  30   SINGH,PRASHANT K M.D on 05/15/2015 at 10:06 AM  Between 7am to 7pm - Pager - [phone]  After 7pm go to [email or web address] - password [facility]  [facility] -  Office  [phone]

## 2015-05-16 LAB — GLUCOSE, CAPILLARY
GLUCOSE-CAPILLARY: 158 mg/dL — AB (ref 65–99)
GLUCOSE-CAPILLARY: 197 mg/dL — AB (ref 65–99)
Glucose-Capillary: 145 mg/dL — ABNORMAL HIGH (ref 65–99)
Glucose-Capillary: 184 mg/dL — ABNORMAL HIGH (ref 65–99)
Glucose-Capillary: 187 mg/dL — ABNORMAL HIGH (ref 65–99)
Glucose-Capillary: 218 mg/dL — ABNORMAL HIGH (ref 65–99)

## 2015-05-16 MED ORDER — DEXTROSE 5 % IV SOLN
INTRAVENOUS | Status: AC
  Administered 2015-05-16 – 2015-05-18 (×3): via INTRAVENOUS

## 2015-05-16 NOTE — Care Management Note (Signed)
Case Management Note  Patient Details  Name: Valerie Rodriguez MRN: [id number] Date of Birth: 1950/11/22  Subjective/Objective:                 Patient to have PEG tube placement after off Plavix for 5 days.   Action/Plan:  Anticipate discharge to SNF, [facility] with hopice care.  Expected Discharge Date:  05/13/15               Expected Discharge Plan:  Skilled Nursing Facility  In-House Referral:  Clinical Social Work  Discharge planning Services  CM Consult  Post Acute Care Choice:    Choice offered to:     DME Arranged:    DME Agency:     HH Arranged:    HH Agency:     Status of Service:  In process, will continue to follow  Medicare Important Message Given:  Yes-fourth notification given Date Medicare IM Given:    Medicare IM give by:    Date Additional Medicare IM Given:    Additional Medicare Important Message give by:     If discussed at Long Length of Stay Meetings, dates discussed:    Additional Comments:  Lawerance Sabal, RN 05/16/2015, 11:04 AM

## 2015-05-16 NOTE — Progress Notes (Signed)
Patient Demographics:    Valerie Rodriguez, is a 64 y.o. female, DOB - [birth year], ZOX:[id number]  Admit date - 05/07/2015   Admitting Physician Leslye Peer, MD  Outpatient Primary MD for the patient is No PCP Per Patient  LOS - 9   Chief Complaint  Patient presents with  . Altered Mental Status  . 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 Patient became less interactive and then was found unresponsive in her facility with O2 sats in 60-70s. Patient intubated.  Patient has a history of HTN, HLD, DM, stroke, CKD, PVD, and PAD who was in her usual state of health (communicative and interactive with sign language) until 2 months ago when she developed several bouts of PNA. The most recent one 2 weeks ago for which she was treated but over the last two weeks her daughter states she has been minimally interactive (if at all). 7/23 she was at her facility and she was found un responsive with O2 sats in the 60-70s. On arrival to the ED she was 80s on NRB mask. She was then intubated for hypoxemia and found to have thick tenatcious secretions and peri-intubation hypotension that resolved with fluid.  She was intubated and kept in ICU by pulmonary critical care team, she was extubated on 05/11/2015 in transfer to hospitalist service under my care on 05/12/2015. She is still minimally responsive with open her eyes to physical stimuli but will not respond to any commands. Continues to have severe generalized weakness. Continues to appear to be very high risk for aspiration.     Subjective:    Valerie Rodriguez today in bed, nonverbal, makes eye contact only, does not appear to be in any distress. Sign language interpreter helped. Her mentation seems to be vexing  and waning.   Assessment  & Plan :     1. Recurrent acute on chronic hypoxic respiratory failure due to COPD exacerbation along with recurrent aspiration pneumonia - HCAP. Patient has had several bouts of recent pneumonia, this admission she required endotracheal intubation. She has extremely poor baseline quality of life with bedbound status, baseline deaf and mute, and now evidence of high risk for recurrent aspiration.  She has finished her steroids, she is now on nasal cannula oxygen after extubation, will stop all other anti-biotics and leave her only on Unasyn on 05/12/2015 Will treat for a total duration of 7 days as clinically much improved, continue nebulizer treatments, seen by speech therapy and underwent FEES study, placed on dysphagia 1 diet. However remains very high risk for aspiration in the future.   Discussed with patient's daughter who is interested in a feeding tube, feeding tube will be placed as I think oral intake will remain questionable with her waxing and waning mentation and high risk for aspiration. Her feeding here has been extremely poor in the last 24 hours as well. I are has been consulted. I are wants her to be Plavix free for 5 days, Plavix on hold, last dose was 05/14/2015.  Daughter agreeable that if patient declines further she will focus on comfort patient is a DO NOT RESUSCITATE now. Palliative care on board.  2. Severe dehydration with acute renal failure and hypernatremia. Improved with D5W hydration. Continue gentle hydration.   3. Generalized weakness, deaf-mute. Extremely poor baseline quality of life. What really bedbound with minimal responsiveness per daughter at baseline. PT, palliative care for goals of care.   4. Risk of aspiration. See #1 above.   5. Hypertension. Oral medications if able to tolerate, as needed IV hydralazine.   6. Hypokalemia and hypomagnesemia. Both replaced. Monitor.    7. DM type II. Currently on sliding scale along  with Lantus continue.   Lab Results  Component Value Date   HGBA1C 9.4* 04/09/2015    CBG (last 3)   Recent Labs  05/16/15 0039 05/16/15 0419 05/16/15 0758  GLUCAP 218* 145* 158*      Code Status : DO NOT RESUSCITATE  Family Communication  : Daughter over the phone  Disposition Plan  : SNF in a few days  Consults  :  Pulmonary critical care, palliative care  Procedures  :   Intubation upon admission and extubated 05/11/2015.  TTE done one month ago shows EF of 60% with chronic grade 1 compensated diastolic heart failure. No wall motion abnormality.    DVT Prophylaxis  :  Heparin   Lab Results  Component Value Date   PLT 182 05/15/2015    Inpatient Medications  Scheduled Meds: . ampicillin-sulbactam (UNASYN) IV  1.5 g Intravenous Q6H  . antiseptic oral rinse  7 mL Mouth Rinse QID  . atenolol  50 mg Oral BID  . chlorhexidine  15 mL Mouth Rinse BID  . citalopram  20 mg Oral Daily  . [START ON 05/21/2015] clopidogrel  75 mg Oral Daily  . collagenase   Topical Daily  . heparin  5,000 Units Subcutaneous 3 times per day  . hydrALAZINE  25 mg Oral 3 times per day  . HYDROcodone-acetaminophen  1 tablet Oral 4 times per day  . insulin aspart  0-20 Units Subcutaneous 6 times per day  . insulin glargine  20 Units Subcutaneous Daily  . latanoprost  1 drop Both Eyes QHS  . pregabalin  75 mg Oral BID  . saccharomyces boulardii  250 mg Oral BID   Continuous Infusions: . dextrose 5 % and 0.45% NaCl 50 mL/hr at 05/15/15 1406   PRN Meds:.sodium chloride, dextrose, hydrALAZINE, ipratropium-albuterol, labetalol  Antibiotics  :     Anti-infectives    Start     Dose/Rate Route Frequency Ordered Stop   05/12/15 1200  ampicillin-sulbactam (UNASYN) 1.5 g in sodium chloride 0.9 % 50 mL IVPB     1.5 g 100 mL/hr over 30 Minutes Intravenous Every 6 hours 05/12/15 1131     05/09/15 1530  cefTRIAXone (ROCEPHIN) 2 g in dextrose 5 % 50 mL IVPB - Premix  Status:  Discontinued      2 g 100 mL/hr over 30 Minutes Intravenous Every 24 hours 05/09/15 1447 05/12/15 1130   05/09/15 1000  vancomycin (VANCOCIN) IVPB 1000 mg/200 mL premix  Status:  Discontinued     1,000 mg 200 mL/hr over 60 Minutes Intravenous Every 48 hours 05/07/15 0857 05/08/15 0840   05/08/15 1400  piperacillin-tazobactam (ZOSYN) IVPB 3.375 g  Status:  Discontinued     3.375 g 12.5 mL/hr over 240 Minutes Intravenous 3 times per day 05/08/15 0840 05/09/15 1446   05/08/15 1100  vancomycin (VANCOCIN) IVPB 1000 mg/200 mL premix  Status:  Discontinued     1,000 mg 200 mL/hr over 60 Minutes Intravenous Every 24 hours  05/08/15 0840 05/09/15 1446   05/07/15 1200  piperacillin-tazobactam (ZOSYN) IVPB 2.25 g  Status:  Discontinued     2.25 g 100 mL/hr over 30 Minutes Intravenous Every 6 hours 05/07/15 0846 05/08/15 0840   05/07/15 0930  vancomycin (VANCOCIN) 500 mg in sodium chloride 0.9 % 100 mL IVPB     500 mg 100 mL/hr over 60 Minutes Intravenous  Once 05/07/15 0857 05/07/15 1121   05/07/15 0830  vancomycin (VANCOCIN) IVPB 750 mg/150 ml premix  Status:  Discontinued     750 mg 150 mL/hr over 60 Minutes Intravenous Every 12 hours 05/07/15 0819 05/07/15 0844   05/07/15 0830  piperacillin-tazobactam (ZOSYN) IVPB 3.375 g  Status:  Discontinued     3.375 g 12.5 mL/hr over 240 Minutes Intravenous 3 times per day 05/07/15 0819 05/07/15 0843   05/07/15 0315  piperacillin-tazobactam (ZOSYN) IVPB 3.375 g     3.375 g 100 mL/hr over 30 Minutes Intravenous  Once 05/07/15 0312 05/07/15 0423   05/07/15 0315  vancomycin (VANCOCIN) IVPB 1000 mg/200 mL premix     1,000 mg 200 mL/hr over 60 Minutes Intravenous  Once 05/07/15 0312 05/07/15 0519        Objective:   Filed Vitals:   05/15/15 1406 05/15/15 1500 05/15/15 2116 05/16/15 0522  BP: 150/62 147/49 123/44 150/43  Pulse:  75 69 62  Temp:  98.7 F (37.1 C) 98.7 F (37.1 C) 98.4 F (36.9 C)  TempSrc:  Oral Oral Oral  Resp:  16 18 18   Height:      Weight:       SpO2:  100% 99% 100%    Wt Readings from Last 3 Encounters:  05/11/15 87.4 kg (192 lb 10.9 oz)  04/12/15 89 kg (196 lb 3.4 oz)  03/26/15 98.2 kg (216 lb 7.9 oz)     Intake/Output Summary (Last 24 hours) at 05/16/15 1117 Last data filed at 05/16/15 0500  Gross per 24 hour  Intake    480 ml  Output   1800 ml  Net  -1320 ml     Physical Exam  Awake , makes eye contact only, moves upper extremity minimally. [place].AT,PERRAL Supple Neck,No JVD, No cervical lymphadenopathy appriciated.  Symmetrical Chest wall movement, Good air movement bilaterally, CTAB RRR,No Gallops,Rubs or new Murmurs, No Parasternal Heave +ve B.Sounds, Abd Soft, No tenderness, No organomegaly appriciated, No rebound - guarding or rigidity. No Cyanosis, Clubbing or edema, No new Rash or bruise    Wearing splints in both legs    Data Review:   Micro Results Recent Results (from the past 240 hour(s))  Culture, blood (routine x 2)     Status: None   Collection Time: 05/07/15  2:57 AM  Result Value Ref Range Status   Specimen Description BLOOD LEFT HAND  Final   Special Requests BOTTLES DRAWN AEROBIC AND ANAEROBIC 5CC  Final   Culture NO GROWTH 5 DAYS  Final   Report Status 05/12/2015 FINAL  Final  Culture, blood (routine x 2)     Status: None   Collection Time: 05/07/15  3:02 AM  Result Value Ref Range Status   Specimen Description BLOOD RIGHT HAND  Final   Special Requests BOTTLES DRAWN AEROBIC AND ANAEROBIC 5CC  Final   Culture NO GROWTH 5 DAYS  Final   Report Status 05/12/2015 FINAL  Final  MRSA PCR Screening     Status: None   Collection Time: 05/07/15  8:16 AM  Result Value Ref Range Status   MRSA  by PCR NEGATIVE NEGATIVE Final    Comment:        The GeneXpert MRSA Assay (FDA approved for NASAL specimens only), is one component of a comprehensive MRSA colonization surveillance program. It is not intended to diagnose MRSA infection nor to guide or monitor treatment for MRSA infections.    Culture, respiratory (tracheal aspirate)     Status: None   Collection Time: 05/07/15  9:27 AM  Result Value Ref Range Status   Specimen Description TRACHEAL ASPIRATE  Final   Special Requests NONE  Final   Gram Stain   Final    FEW WBC PRESENT, PREDOMINANTLY MONONUCLEAR RARE SQUAMOUS EPITHELIAL CELLS PRESENT MODERATE YEAST Performed at [facility]    Culture   Final    MODERATE YEAST CONSISTENT WITH CANDIDA SPECIES Performed at [facility]    Report Status 05/10/2015 FINAL  Final  Respiratory virus panel     Status: None   Collection Time: 05/07/15 10:56 AM  Result Value Ref Range Status   Source - RVPAN NASOPHARYNGEAL  Corrected   Respiratory Syncytial Virus A Negative Negative Final   Respiratory Syncytial Virus B Negative Negative Final   Influenza A Negative Negative Final   Influenza B Negative Negative Final   Parainfluenza 1 Negative Negative Final   Parainfluenza 2 Negative Negative Final   Parainfluenza 3 Negative Negative Final   Metapneumovirus Negative Negative Final   Rhinovirus Negative Negative Final   Adenovirus Negative Negative Final    Comment: (NOTE) Performed At: [facility] [address] [place], [place] [id number] Mila Homer MD WU:[id number]     Radiology Reports Dg Chest Port 1 View  05/10/2015   CLINICAL DATA:  Check endotracheal tube position  EXAM: PORTABLE CHEST - 1 VIEW  COMPARISON:  Radiograph 05/09/2015  FINDINGS: Endotrachea tube is 4 cm from carina. NG tube extends into the stomach. Interval improvement in aeration lung bases. Central venous congestion. No pneumothorax.  IMPRESSION: 1. Endotracheal tube appears in good position. 2. Increased aeration to lung bases.   Electronically Signed   By: Genevive Bi M.D.   On: 05/10/2015 16:48   Dg Chest Port 1 View  05/09/2015   CLINICAL DATA:  Acute respiratory failure  EXAM: PORTABLE CHEST - 1 VIEW  COMPARISON:  05/07/2015  FINDINGS: Cardiac shadow is  stable. Patient is somewhat rotated to the right. The overall inspiratory effort is poor. No focal confluent infiltrate is seen. An endotracheal tube and nasogastric catheter are again seen and stable. No acute bony abnormality is noted.  IMPRESSION: Poor inspiratory effort without acute abnormality.   Electronically Signed   By: Alcide Clever M.D.   On: 05/09/2015 07:29   Dg Chest Port 1 View  05/07/2015   CLINICAL DATA:  Recent endotracheal readjustment  EXAM: PORTABLE CHEST - 1 VIEW  COMPARISON:  04/27/2015  FINDINGS: The endotracheal tube now lies approximately 2 cm above the carina. A nasogastric catheter is seen within the stomach. Cardiac shadow is stable. Persistent right basilar atelectasis is noted. No other focal abnormality is noted.  IMPRESSION: Stable right basilar atelectasis.  The endotracheal tube as described.   Electronically Signed   By: Alcide Clever M.D.   On: 05/07/2015 11:14   Dg Chest Port 1 View  05/07/2015   CLINICAL DATA:  Check endotracheal tube placement 05/07/2015  EXAM: PORTABLE CHEST - 1 VIEW  COMPARISON:  None.  FINDINGS: Endotracheal tube is again noted 12 mm above the carina and should be withdrawn  2 cm. A nasogastric catheter is noted within the stomach although the proximal side port lies in the distal esophagus. Elevation of the right hemidiaphragm with right basilar atelectasis is noted. The cardiac shadow is stable.  IMPRESSION: Endotracheal tube still 12 mm above the carina. This should be withdrawn approximately 2 cm.  Nasogastric catheter as described.  Increasing right basilar atelectasis.   Electronically Signed   By: Alcide Clever M.D.   On: 05/07/2015 09:06   Dg Chest Port 1 View  05/07/2015   CLINICAL DATA:  Endotracheal tube placement.  Initial encounter.  EXAM: PORTABLE CHEST - 1 VIEW  COMPARISON:  Chest radiograph performed earlier today at 3:01 a.m.  FINDINGS: The patient's endotracheal tube is noted extending just below the carina, overlying the right  mainstem bronchus. This could be retracted approximately 3 cm. The enteric tube is noted extending below the diaphragm, with the side port noted about the distal esophagus. This could be advanced 8 cm, as deemed clinically appropriate.  The lungs are hypoexpanded. Right perihilar airspace opacity may reflect atelectasis, with mild right-sided volume loss noted. No pleural effusion or pneumothorax is seen.  The cardiomediastinal silhouette is borderline normal in size. No acute osseous abnormalities identified.  IMPRESSION: 1. Endotracheal tube seen ending just below the carina, overlying the right mainstem bronchus. This could be retracted approximately 3 cm. 2. Enteric tube seen extending below the diaphragm, with the side port about the distal esophagus. This could be advanced 8 cm, is deemed clinically appropriate. 3. Lungs hypoexpanded. Right perihilar airspace opacity may reflect atelectasis, with mild right-sided volume loss noted. These results were called by telephone at the time of interpretation on 05/07/2015 at 5:48 am to Dr. Loren Racer, who verbally acknowledged these results.   Electronically Signed   By: Roanna Raider M.D.   On: 05/07/2015 05:48   Dg Chest Port 1 View  05/07/2015   CLINICAL DATA:  Altered mental status, acute onset. Initial encounter.  EXAM: PORTABLE CHEST - 1 VIEW  COMPARISON:  Chest radiograph performed 04/08/2015  FINDINGS: The lungs remain hypoexpanded. Left basilar airspace opacity may reflect atelectasis or possibly mild pneumonia. No definite pleural effusion or pneumothorax is seen.  The cardiomediastinal silhouette is enlarged. No acute osseous abnormalities are identified.  IMPRESSION: Lungs remain hypoexpanded. Left basilar airspace opacity may reflect atelectasis or possibly mild pneumonia. Cardiomegaly noted.   Electronically Signed   By: Roanna Raider M.D.   On: 05/07/2015 03:44   Dg Abd Portable 1v  05/09/2015   CLINICAL DATA:  Ileus. Abdominal distention.  Nasogastric tube placement  EXAM: PORTABLE ABDOMEN - 1 VIEW  COMPARISON:  05/07/2015  FINDINGS: A nasogastric tube remains in place with tip again seen overlying the expected location of the transverse duodenum. Generalized gaseous distention of colon is again demonstrated, consistent with mild ileus. No dilated small bowel loops demonstrated.  IMPRESSION: Nasogastric tube tip again seen overlying the expected location of the transverse duodenum.  Persistent colonic distention, suspicious for ileus.   Electronically Signed   By: Myles Rosenthal M.D.   On: 05/09/2015 15:33   Dg Abd Portable 1v  05/07/2015   CLINICAL DATA:  Orogastric tube position  EXAM: PORTABLE ABDOMEN - 1 VIEW  COMPARISON:  Chest radiograph same date  FINDINGS: Tip of orogastric tube terminates over the expected location of the third portion of the duodenum. Right upper quadrant clips are noted. Normal bowel gas pattern.  IMPRESSION: Feeding tube tip terminates over the expected location of the third  portion of the duodenum.   Electronically Signed   By: Christiana Pellant M.D.   On: 05/07/2015 11:19     CBC  Recent Labs Lab 05/10/15 0525 05/13/15 0620 05/15/15 0619  WBC 7.9 7.7 7.9  HGB 8.9* 10.2* 9.3*  HCT 28.9* 31.1* 29.0*  PLT 121* 121* 182  MCV 83.5 79.9 79.9  MCH 25.7* 26.2 25.6*  MCHC 30.8 32.8 32.1  RDW 17.8* 17.1* 17.4*    Chemistries   Recent Labs Lab 05/11/15 1523 05/12/15 0541 05/13/15 0620 05/14/15 0427 05/15/15 0619  NA 150* 148* 139 139 144  K 4.0 3.3* 3.4* 3.1* 3.5  CL 121* 119* 109 110 113*  CO2 20* 22 24 23 23   GLUCOSE 154* 221* 215* 169* 179*  BUN 56* 38* 26* 21* 19  CREATININE 1.10* 1.03* 0.93 1.00 1.11*  CALCIUM 8.3* 8.5* 8.4* 8.7* 9.0  MG  --   --  1.4*  --  2.0   ------------------------------------------------------------------------------------------------------------------ estimated creatinine clearance is 52.1 mL/min (by C-G formula based on Cr of  1.11). ------------------------------------------------------------------------------------------------------------------ No results for input(s): HGBA1C in the last 72 hours. ------------------------------------------------------------------------------------------------------------------ No results for input(s): CHOL, HDL, LDLCALC, TRIG, CHOLHDL, LDLDIRECT in the last 72 hours. ------------------------------------------------------------------------------------------------------------------ No results for input(s): TSH, T4TOTAL, T3FREE, THYROIDAB in the last 72 hours.  Invalid input(s): FREET3 ------------------------------------------------------------------------------------------------------------------ No results for input(s): VITAMINB12, FOLATE, FERRITIN, TIBC, IRON, RETICCTPCT in the last 72 hours.  Coagulation profile  Recent Labs Lab 05/12/15 1414 05/14/15 0759  INR 1.12 1.08    No results for input(s): DDIMER in the last 72 hours.  Cardiac Enzymes No results for input(s): CKMB, TROPONINI, MYOGLOBIN in the last 168 hours.  Invalid input(s): CK ------------------------------------------------------------------------------------------------------------------ Invalid input(s): POCBNP   Time Spent in minutes  30   SINGH,PRASHANT K M.D on 05/16/2015 at 11:17 AM  Between 7am to 7pm - Pager - [phone]  After 7pm go to [email or web address] - password [facility]  [facility] -  Office  [phone]

## 2015-05-16 NOTE — Care Management Important Message (Addendum)
Important Message  Patient Details  Name: MALIKA DEMARIO MRN: [id number] Date of Birth: 08/02/1951   Medicare Important Message Given:  Yes-fourth notification given    Lawerance Sabal, RN 05/16/2015, 11:03 AMImportant Message  Patient Details  Name: SABENA WINNER MRN: [id number] Date of Birth: January 19, 1951   Medicare Important Message Given:  Yes-fourth notification given    Lawerance Sabal, RN 05/16/2015, 11:02 AM

## 2015-05-16 NOTE — Progress Notes (Signed)
Nutrition Follow-up  DOCUMENTATION CODES:   Obesity unspecified  INTERVENTION:   -Magic Cup TID with meals  -If pt/pt family decide on PEG placement, recommend:  Initiate Glucerna @ 20 ml/hr via PEG and increase by 10 ml every 4 hours to goal rate of 60 ml/hr.   Tube feeding regimen provides 1728 kcal (100% of needs), 86 grams of protein, and 1159 ml of H2O.   NUTRITION DIAGNOSIS:   Inadequate oral intake related to dysphagia as evidenced by meal completion < 25%.  Ongoing  GOAL:   Patient will meet greater than or equal to 90% of their needs  Unmet  MONITOR:   PO intake, Supplement acceptance, Diet advancement, Labs, Weight trends, Skin, I & O's  REASON FOR ASSESSMENT:   Consult Enteral/tube feeding initiation and management  ASSESSMENT:   Patient admitted on 7/23 with acute hypoxemic respiratory failure. Hx of being blind and deaf, and recent PNA.  Pt sitting up in bed at time of visit; made eye contact with this RD. No family present.  Pt s/p FEES on 05/11/15. She has been advanced to a dysphagia 1 diet with honey thick liquids. Intake remains poor; PO: 25% of meals.   Pt family continues to desire PEG. Per MD notes, PEG placement cannot be pursued until pt has been off Plavix for 5 days.   Reviewed C[facility]RN note on 05/09/15. Pt with stage II pressure ulcer on lt heel, stage III pressure ulcer on rt heel, unstageable pressure ulcer on gluteal crease, and MASD on buttocks.   Palliative care continues to follow for goals of care. Per palliative note, pt family would like to transition to comfort care if pt continues to aspirate.   Per CSW note, plan is to d/c back to [facility] once medically stable.  Diet Order:  DIET - DYS 1 Room service appropriate?: Yes; Fluid consistency:: Honey Thick  Skin:  Wound (see comment) (st II lt heel, st III rt heel, UN scarum and gluteal crease,)  Last BM:  05/14/15  Height:   Ht Readings from Last 1 Encounters:   05/07/15  (1.549 m)    Weight:   Wt Readings from Last 1 Encounters:  05/11/15 192 lb 10.9 oz (87.4 kg)    Ideal Body Weight:  47.7 kg  BMI:  Body mass index is 36.43 kg/(m^2).  Estimated Nutritional Needs:   Kcal:  1600-1800  Protein:  85-100 grams  Fluid:  1.6-1.8 L  EDUCATION NEEDS:   No education needs identified at this time  Deaundre Allston A. Mayford Knife, RD, LDN, CDE Pager: [phone] After hours Pager: [phone]

## 2015-05-17 ENCOUNTER — Encounter: Payer: Self-pay | Admitting: Internal Medicine

## 2015-05-17 LAB — BASIC METABOLIC PANEL
Anion gap: 8 (ref 5–15)
BUN: 9 mg/dL (ref 6–20)
CO2: 26 mmol/L (ref 22–32)
Calcium: 8.9 mg/dL (ref 8.9–10.3)
Chloride: 106 mmol/L (ref 101–111)
Creatinine, Ser: 0.94 mg/dL (ref 0.44–1.00)
GFR calc non Af Amer: >60 mL/min (ref >60)
Glucose, Bld: 169 mg/dL — ABNORMAL HIGH (ref 65–99)
Potassium: 3.4 mmol/L — ABNORMAL LOW (ref 3.5–5.1)
Sodium: 140 mmol/L (ref 135–145)

## 2015-05-17 LAB — GLUCOSE, CAPILLARY
GLUCOSE-CAPILLARY: 190 mg/dL — AB (ref 65–99)
GLUCOSE-CAPILLARY: 219 mg/dL — AB (ref 65–99)
Glucose-Capillary: 151 mg/dL — ABNORMAL HIGH (ref 65–99)
Glucose-Capillary: 162 mg/dL — ABNORMAL HIGH (ref 65–99)
Glucose-Capillary: 185 mg/dL — ABNORMAL HIGH (ref 65–99)
Glucose-Capillary: 190 mg/dL — ABNORMAL HIGH (ref 65–99)

## 2015-05-17 MED ORDER — MORPHINE SULFATE 2 MG/ML IJ SOLN
2.0000 mg | Freq: Once | INTRAMUSCULAR | Status: AC
  Administered 2015-05-17: 2 mg via INTRAVENOUS

## 2015-05-17 MED ORDER — MORPHINE SULFATE 2 MG/ML IJ SOLN
INTRAMUSCULAR | Status: AC
  Filled 2015-05-17: qty 1

## 2015-05-17 MED ORDER — MORPHINE SULFATE 2 MG/ML IJ SOLN
2.0000 mg | Freq: Four times a day (QID) | INTRAMUSCULAR | Status: DC | PRN
  Administered 2015-05-17 – 2015-05-22 (×7): 2 mg via INTRAVENOUS
  Filled 2015-05-17 (×8): qty 1

## 2015-05-17 MED ORDER — POTASSIUM CHLORIDE CRYS ER 20 MEQ PO TBCR: 40.0000 meq | EXTENDED_RELEASE_TABLET | ORAL | Status: DC | PRN

## 2015-05-17 NOTE — Progress Notes (Signed)
Patient Demographics:    Valerie Rodriguez, is a 64 y.o. female, DOB - 1951/07/18, ZOX:[id number]  Admit date - 05/07/2015   Admitting Physician Leslye Peer, MD  Outpatient Primary MD for the patient is No PCP Per Patient  LOS - 10   Chief Complaint  Patient presents with  . Altered Mental Status  . 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 Patient became less interactive and then was found unresponsive in her facility with O2 sats in 60-70s. Patient intubated.  Patient has a history of HTN, HLD, DM, stroke, CKD, PVD, and PAD who was in her usual state of health (communicative and interactive with sign language) until 2 months ago when she developed several bouts of PNA. The most recent one 2 weeks ago for which she was treated but over the last two weeks her daughter states she has been minimally interactive (if at all). 7/23 she was at her facility and she was found un responsive with O2 sats in the 60-70s. On arrival to the ED she was 80s on NRB mask. She was then intubated for hypoxemia and found to have thick tenatcious secretions and peri-intubation hypotension that resolved with fluid.  She was intubated and kept in ICU by pulmonary critical care team, she was extubated on 05/11/2015 in transfer to hospitalist service under my care on 05/12/2015. She is still minimally responsive with open her eyes to physical stimuli but will not respond to any commands. Continues to have severe generalized weakness. Continues to appear to be very high risk for aspiration. She has waxing and waning mental status, nutrition status remains poor. Daughter is very clear that she would like to pursue a PEG tube placement with the clear understanding that the patient  can still aspirate, she wishes her mom to be DO NOT RESUSCITATE and if she declines further to be full hospice or comfort care. However she does want to get her mom some nutrition through PEG tube .  PEG tube has been requested, we have been waiting for patient to be off of Plavix for 5 days before I will place the PEG tube. Plavix continues to be on hold.    Subjective:    Valerie Rodriguez today in bed, nonverbal, makes eye contact only, does not appear to be in any distress. Sign language interpreter helped. Her mentation seems to be waxing and waning.   Assessment  & Plan :     1. Recurrent acute on chronic hypoxic respiratory failure due to COPD exacerbation along with recurrent aspiration pneumonia - HCAP. Patient has had several bouts of recent pneumonia, this admission she required endotracheal intubation. She has extremely poor baseline quality of life with bedbound status, baseline deaf and mute, and now evidence of high risk for recurrent aspiration.  She has finished her steroids, she is now on nasal cannula oxygen after extubation, will stop her Unasyn on 05/17/2015 has pneumonia has clinically resolved, continue nebulizer treatments, seen by speech therapy and underwent FEES study, placed on dysphagia 1 diet with nectar thick liquids. However remains very high risk for aspiration in the future. An oral intake remains very poor with waxing  and waning mentation.  Discussed with patient's daughter who is interested in a feeding tube, feeding tube will be placed as I think oral intake will remain questionable with her waxing and waning mentation and high risk for aspiration. Her feeding here has been extremely poor in the last 24 hours as well. I are has been consulted. I are wants her to be Plavix free for 5 days, Plavix on hold, last dose was 05/14/2015. PEG tube likely to be placed on 05/20/2015.  Daughter agreeable that if patient declines further she will focus on comfort patient is a  DO NOT RESUSCITATE now. Palliative care on board.    2. Severe dehydration with acute renal failure and hypernatremia. Improved with D5W hydration. Continue gentle hydration.   3. Generalized weakness, deaf-mute. Extremely poor baseline quality of life. What really bedbound with minimal responsiveness per daughter at baseline. PT, palliative care following for goals of care.   4. Risk of aspiration. See #1 above.   5. Hypertension. Oral medications if able to tolerate, as needed IV hydralazine.   6. Hypokalemia and hypomagnesemia. Both replaced. Monitor.    7. DM type II. Currently on sliding scale along with Lantus continue.   Lab Results  Component Value Date   HGBA1C 9.4* 04/09/2015    CBG (last 3)   Recent Labs  05/17/15 0001 05/17/15 0414 05/17/15 0754  GLUCAP 185* 162* 151*      Code Status : DO NOT RESUSCITATE  Family Communication  : Daughter over the phone  Disposition Plan  : SNF after PEG tube was placed and functional likely 05/21/2015  Consults  :  Pulmonary critical care, palliative care  Procedures  :   Intubation upon admission and extubated 05/11/2015.  TTE done one month ago shows EF of 60% with chronic grade 1 compensated diastolic heart failure. No wall motion abnormality.    DVT Prophylaxis  :  Heparin   Lab Results  Component Value Date   PLT 182 05/15/2015    Inpatient Medications  Scheduled Meds: . ampicillin-sulbactam (UNASYN) IV  1.5 g Intravenous Q6H  . antiseptic oral rinse  7 mL Mouth Rinse QID  . atenolol  50 mg Oral BID  . chlorhexidine  15 mL Mouth Rinse BID  . citalopram  20 mg Oral Daily  . [START ON 05/21/2015] clopidogrel  75 mg Oral Daily  . collagenase   Topical Daily  . heparin  5,000 Units Subcutaneous 3 times per day  . hydrALAZINE  25 mg Oral 3 times per day  . HYDROcodone-acetaminophen  1 tablet Oral 4 times per day  . insulin aspart  0-20 Units Subcutaneous 6 times per day  . insulin glargine  20  Units Subcutaneous Daily  . latanoprost  1 drop Both Eyes QHS  . morphine      . pregabalin  75 mg Oral BID  . saccharomyces boulardii  250 mg Oral BID   Continuous Infusions: . dextrose 75 mL/hr at 05/17/15 0305   PRN Meds:.sodium chloride, dextrose, hydrALAZINE, ipratropium-albuterol, labetalol, morphine injection, potassium chloride  Antibiotics  :     Anti-infectives    Start     Dose/Rate Route Frequency Ordered Stop   05/12/15 1200  ampicillin-sulbactam (UNASYN) 1.5 g in sodium chloride 0.9 % 50 mL IVPB     1.5 g 100 mL/hr over 30 Minutes Intravenous Every 6 hours 05/12/15 1131     05/09/15 1530  cefTRIAXone (ROCEPHIN) 2 g in dextrose 5 % 50 mL IVPB - Premix  Status:  Discontinued     2 g 100 mL/hr over 30 Minutes Intravenous Every 24 hours 05/09/15 1447 05/12/15 1130   05/09/15 1000  vancomycin (VANCOCIN) IVPB 1000 mg/200 mL premix  Status:  Discontinued     1,000 mg 200 mL/hr over 60 Minutes Intravenous Every 48 hours 05/07/15 0857 05/08/15 0840   05/08/15 1400  piperacillin-tazobactam (ZOSYN) IVPB 3.375 g  Status:  Discontinued     3.375 g 12.5 mL/hr over 240 Minutes Intravenous 3 times per day 05/08/15 0840 05/09/15 1446   05/08/15 1100  vancomycin (VANCOCIN) IVPB 1000 mg/200 mL premix  Status:  Discontinued     1,000 mg 200 mL/hr over 60 Minutes Intravenous Every 24 hours 05/08/15 0840 05/09/15 1446   05/07/15 1200  piperacillin-tazobactam (ZOSYN) IVPB 2.25 g  Status:  Discontinued     2.25 g 100 mL/hr over 30 Minutes Intravenous Every 6 hours 05/07/15 0846 05/08/15 0840   05/07/15 0930  vancomycin (VANCOCIN) 500 mg in sodium chloride 0.9 % 100 mL IVPB     500 mg 100 mL/hr over 60 Minutes Intravenous  Once 05/07/15 0857 05/07/15 1121   05/07/15 0830  vancomycin (VANCOCIN) IVPB 750 mg/150 ml premix  Status:  Discontinued     750 mg 150 mL/hr over 60 Minutes Intravenous Every 12 hours 05/07/15 0819 05/07/15 0844   05/07/15 0830  piperacillin-tazobactam (ZOSYN) IVPB  3.375 g  Status:  Discontinued     3.375 g 12.5 mL/hr over 240 Minutes Intravenous 3 times per day 05/07/15 0819 05/07/15 0843   05/07/15 0315  piperacillin-tazobactam (ZOSYN) IVPB 3.375 g     3.375 g 100 mL/hr over 30 Minutes Intravenous  Once 05/07/15 0312 05/07/15 0423   05/07/15 0315  vancomycin (VANCOCIN) IVPB 1000 mg/200 mL premix     1,000 mg 200 mL/hr over 60 Minutes Intravenous  Once 05/07/15 0312 05/07/15 0519        Objective:   Filed Vitals:   05/16/15 0522 05/16/15 2119 05/16/15 2324 05/17/15 0533  BP: 150/43 198/65 154/48 144/54  Pulse: 62 65  59  Temp: 98.4 F (36.9 C) 99.4 F (37.4 C)  97.7 F (36.5 C)  TempSrc: Oral Oral  Oral  Resp: 18 18  18   Height:      Weight:      SpO2: 100% 100%  100%    Wt Readings from Last 3 Encounters:  05/11/15 87.4 kg (192 lb 10.9 oz)  04/12/15 89 kg (196 lb 3.4 oz)  03/26/15 98.2 kg (216 lb 7.9 oz)     Intake/Output Summary (Last 24 hours) at 05/17/15 1015 Last data filed at 05/17/15 0900  Gross per 24 hour  Intake    580 ml  Output   1700 ml  Net  -1120 ml     Physical Exam  Awake , makes eye contact only, moves upper extremity minimally. [place].AT,PERRAL Supple Neck,No JVD, No cervical lymphadenopathy appriciated.  Symmetrical Chest wall movement, Good air movement bilaterally, CTAB RRR,No Gallops,Rubs or new Murmurs, No Parasternal Heave +ve B.Sounds, Abd Soft, No tenderness, No organomegaly appriciated, No rebound - guarding or rigidity. No Cyanosis, Clubbing or edema, No new Rash or bruise    Wearing splints in both legs    Data Review:   Micro Results Recent Results (from the past 240 hour(s))  Respiratory virus panel     Status: None   Collection Time: 05/07/15 10:56 AM  Result Value Ref Range Status   Source - RVPAN NASOPHARYNGEAL  Corrected  Respiratory Syncytial Virus A Negative Negative Final   Respiratory Syncytial Virus B Negative Negative Final   Influenza A Negative Negative Final    Influenza B Negative Negative Final   Parainfluenza 1 Negative Negative Final   Parainfluenza 2 Negative Negative Final   Parainfluenza 3 Negative Negative Final   Metapneumovirus Negative Negative Final   Rhinovirus Negative Negative Final   Adenovirus Negative Negative Final    Comment: (NOTE) Performed At: [facility] [address] [place], [place] [id number] Mila Homer MD WU:[id number]     Radiology Reports Dg Chest Port 1 View  05/10/2015   CLINICAL DATA:  Check endotracheal tube position  EXAM: PORTABLE CHEST - 1 VIEW  COMPARISON:  Radiograph 05/09/2015  FINDINGS: Endotrachea tube is 4 cm from carina. NG tube extends into the stomach. Interval improvement in aeration lung bases. Central venous congestion. No pneumothorax.  IMPRESSION: 1. Endotracheal tube appears in good position. 2. Increased aeration to lung bases.   Electronically Signed   By: Genevive Bi M.D.   On: 05/10/2015 16:48   Dg Chest Port 1 View  05/09/2015   CLINICAL DATA:  Acute respiratory failure  EXAM: PORTABLE CHEST - 1 VIEW  COMPARISON:  05/07/2015  FINDINGS: Cardiac shadow is stable. Patient is somewhat rotated to the right. The overall inspiratory effort is poor. No focal confluent infiltrate is seen. An endotracheal tube and nasogastric catheter are again seen and stable. No acute bony abnormality is noted.  IMPRESSION: Poor inspiratory effort without acute abnormality.   Electronically Signed   By: Alcide Clever M.D.   On: 05/09/2015 07:29   Dg Chest Port 1 View  05/07/2015   CLINICAL DATA:  Recent endotracheal readjustment  EXAM: PORTABLE CHEST - 1 VIEW  COMPARISON:  04/27/2015  FINDINGS: The endotracheal tube now lies approximately 2 cm above the carina. A nasogastric catheter is seen within the stomach. Cardiac shadow is stable. Persistent right basilar atelectasis is noted. No other focal abnormality is noted.  IMPRESSION: Stable right basilar atelectasis.  The endotracheal tube as  described.   Electronically Signed   By: Alcide Clever M.D.   On: 05/07/2015 11:14   Dg Chest Port 1 View  05/07/2015   CLINICAL DATA:  Check endotracheal tube placement 05/07/2015  EXAM: PORTABLE CHEST - 1 VIEW  COMPARISON:  None.  FINDINGS: Endotracheal tube is again noted 12 mm above the carina and should be withdrawn 2 cm. A nasogastric catheter is noted within the stomach although the proximal side port lies in the distal esophagus. Elevation of the right hemidiaphragm with right basilar atelectasis is noted. The cardiac shadow is stable.  IMPRESSION: Endotracheal tube still 12 mm above the carina. This should be withdrawn approximately 2 cm.  Nasogastric catheter as described.  Increasing right basilar atelectasis.   Electronically Signed   By: Alcide Clever M.D.   On: 05/07/2015 09:06   Dg Chest Port 1 View  05/07/2015   CLINICAL DATA:  Endotracheal tube placement.  Initial encounter.  EXAM: PORTABLE CHEST - 1 VIEW  COMPARISON:  Chest radiograph performed earlier today at 3:01 a.m.  FINDINGS: The patient's endotracheal tube is noted extending just below the carina, overlying the right mainstem bronchus. This could be retracted approximately 3 cm. The enteric tube is noted extending below the diaphragm, with the side port noted about the distal esophagus. This could be advanced 8 cm, as deemed clinically appropriate.  The lungs are hypoexpanded. Right perihilar airspace opacity may reflect atelectasis, with mild  right-sided volume loss noted. No pleural effusion or pneumothorax is seen.  The cardiomediastinal silhouette is borderline normal in size. No acute osseous abnormalities identified.  IMPRESSION: 1. Endotracheal tube seen ending just below the carina, overlying the right mainstem bronchus. This could be retracted approximately 3 cm. 2. Enteric tube seen extending below the diaphragm, with the side port about the distal esophagus. This could be advanced 8 cm, is deemed clinically appropriate. 3.  Lungs hypoexpanded. Right perihilar airspace opacity may reflect atelectasis, with mild right-sided volume loss noted. These results were called by telephone at the time of interpretation on 05/07/2015 at 5:48 am to Dr. Loren Racer, who verbally acknowledged these results.   Electronically Signed   By: Roanna Raider M.D.   On: 05/07/2015 05:48   Dg Chest Port 1 View  05/07/2015   CLINICAL DATA:  Altered mental status, acute onset. Initial encounter.  EXAM: PORTABLE CHEST - 1 VIEW  COMPARISON:  Chest radiograph performed 04/08/2015  FINDINGS: The lungs remain hypoexpanded. Left basilar airspace opacity may reflect atelectasis or possibly mild pneumonia. No definite pleural effusion or pneumothorax is seen.  The cardiomediastinal silhouette is enlarged. No acute osseous abnormalities are identified.  IMPRESSION: Lungs remain hypoexpanded. Left basilar airspace opacity may reflect atelectasis or possibly mild pneumonia. Cardiomegaly noted.   Electronically Signed   By: Roanna Raider M.D.   On: 05/07/2015 03:44   Dg Abd Portable 1v  05/09/2015   CLINICAL DATA:  Ileus. Abdominal distention. Nasogastric tube placement  EXAM: PORTABLE ABDOMEN - 1 VIEW  COMPARISON:  05/07/2015  FINDINGS: A nasogastric tube remains in place with tip again seen overlying the expected location of the transverse duodenum. Generalized gaseous distention of colon is again demonstrated, consistent with mild ileus. No dilated small bowel loops demonstrated.  IMPRESSION: Nasogastric tube tip again seen overlying the expected location of the transverse duodenum.  Persistent colonic distention, suspicious for ileus.   Electronically Signed   By: Myles Rosenthal M.D.   On: 05/09/2015 15:33   Dg Abd Portable 1v  05/07/2015   CLINICAL DATA:  Orogastric tube position  EXAM: PORTABLE ABDOMEN - 1 VIEW  COMPARISON:  Chest radiograph same date  FINDINGS: Tip of orogastric tube terminates over the expected location of the third portion of the  duodenum. Right upper quadrant clips are noted. Normal bowel gas pattern.  IMPRESSION: Feeding tube tip terminates over the expected location of the third portion of the duodenum.   Electronically Signed   By: Christiana Pellant M.D.   On: 05/07/2015 11:19     CBC  Recent Labs Lab 05/13/15 0620 05/15/15 0619  WBC 7.7 7.9  HGB 10.2* 9.3*  HCT 31.1* 29.0*  PLT 121* 182  MCV 79.9 79.9  MCH 26.2 25.6*  MCHC 32.8 32.1  RDW 17.1* 17.4*    Chemistries   Recent Labs Lab 05/12/15 0541 05/13/15 0620 05/14/15 0427 05/15/15 0619 05/17/15 0605  NA 148* 139 139 144 140  K 3.3* 3.4* 3.1* 3.5 3.4*  CL 119* 109 110 113* 106  CO2 22 24 23 23 26   GLUCOSE 221* 215* 169* 179* 169*  BUN 38* 26* 21* 19 9  CREATININE 1.03* 0.93 1.00 1.11* 0.94  CALCIUM 8.5* 8.4* 8.7* 9.0 8.9  MG  --  1.4*  --  2.0  --    ------------------------------------------------------------------------------------------------------------------ estimated creatinine clearance is 61.5 mL/min (by C-G formula based on Cr of 0.94). ------------------------------------------------------------------------------------------------------------------ No results for input(s): HGBA1C in the last 72 hours. ------------------------------------------------------------------------------------------------------------------ No results  for input(s): CHOL, HDL, LDLCALC, TRIG, CHOLHDL, LDLDIRECT in the last 72 hours. ------------------------------------------------------------------------------------------------------------------ No results for input(s): TSH, T4TOTAL, T3FREE, THYROIDAB in the last 72 hours.  Invalid input(s): FREET3 ------------------------------------------------------------------------------------------------------------------ No results for input(s): VITAMINB12, FOLATE, FERRITIN, TIBC, IRON, RETICCTPCT in the last 72 hours.  Coagulation profile  Recent Labs Lab 05/12/15 1414 05/14/15 0759  INR 1.12 1.08    No  results for input(s): DDIMER in the last 72 hours.  Cardiac Enzymes No results for input(s): CKMB, TROPONINI, MYOGLOBIN in the last 168 hours.  Invalid input(s): CK ------------------------------------------------------------------------------------------------------------------ Invalid input(s): POCBNP   Time Spent in minutes  30   Braedon Sjogren K M.D on 05/17/2015 at 10:15 AM  Between 7am to 7pm - Pager - [phone]  After 7pm go to [email or web address] - password [facility]  [facility] -  Office  [phone]

## 2015-05-17 NOTE — Progress Notes (Signed)
Utilization Review completed. Xiomar Crompton RN BSN CM 

## 2015-05-17 NOTE — Clinical Social Work Note (Signed)
Patient is a long-term resident at [facility] and will return once appropriate. Patient schedule to have peg tube placement on Thursday, 8/4.  CSW will continue to follow patient and pt's family for continued support and to facilitate patient's discharge needs once medically stable.   Derenda Fennel, MSW, LCSWA [phone] 05/17/2015 12:34 PM

## 2015-05-17 NOTE — Progress Notes (Signed)
Valerie Rodriguez is sleeping and appears comfortable. No family at bedside. I will continue to follow/shadow along. Daughter, [name], has my contact. Please call for any further acute palliative needs.   Yong Channel, NP Palliative Medicine Team Pager # [phone] (M-F 8a-5p) Team Phone # [phone] (Nights/Weekends)

## 2015-05-18 ENCOUNTER — Encounter (HOSPITAL_COMMUNITY): Payer: Self-pay | Admitting: Radiology

## 2015-05-18 DIAGNOSIS — R1310 Dysphagia, unspecified: Secondary | ICD-10-CM

## 2015-05-18 DIAGNOSIS — E119 Type 2 diabetes mellitus without complications: Secondary | ICD-10-CM

## 2015-05-18 DIAGNOSIS — E46 Unspecified protein-calorie malnutrition: Secondary | ICD-10-CM | POA: Insufficient documentation

## 2015-05-18 DIAGNOSIS — E870 Hyperosmolality and hypernatremia: Secondary | ICD-10-CM

## 2015-05-18 LAB — GLUCOSE, CAPILLARY
Glucose-Capillary: 170 mg/dL — ABNORMAL HIGH (ref 65–99)
Glucose-Capillary: 192 mg/dL — ABNORMAL HIGH (ref 65–99)
Glucose-Capillary: 194 mg/dL — ABNORMAL HIGH (ref 65–99)
Glucose-Capillary: 204 mg/dL — ABNORMAL HIGH (ref 65–99)
Glucose-Capillary: 214 mg/dL — ABNORMAL HIGH (ref 65–99)
Glucose-Capillary: 221 mg/dL — ABNORMAL HIGH (ref 65–99)
Glucose-Capillary: 226 mg/dL — ABNORMAL HIGH (ref 65–99)

## 2015-05-18 LAB — CBC
HCT: 28.6 % — ABNORMAL LOW (ref 36.0–46.0)
Hemoglobin: 9.2 g/dL — ABNORMAL LOW (ref 12.0–15.0)
MCH: 25.9 pg — ABNORMAL LOW (ref 26.0–34.0)
MCHC: 32.2 g/dL (ref 30.0–36.0)
MCV: 80.6 fL (ref 78.0–100.0)
Platelets: 229 10*3/uL (ref 150–400)
RBC: 3.55 MIL/uL — ABNORMAL LOW (ref 3.87–5.11)
RDW: 17.9 % — AB (ref 11.5–15.5)
WBC: 7.2 10*3/uL (ref 4.0–10.5)

## 2015-05-18 LAB — BASIC METABOLIC PANEL
Anion gap: 7 (ref 5–15)
BUN: 7 mg/dL (ref 6–20)
CALCIUM: 9 mg/dL (ref 8.9–10.3)
CO2: 24 mmol/L (ref 22–32)
Chloride: 105 mmol/L (ref 101–111)
Creatinine, Ser: 0.89 mg/dL (ref 0.44–1.00)
GFR calc Af Amer: >60 mL/min (ref >60)
GFR calc non Af Amer: >60 mL/min (ref >60)
GLUCOSE: 206 mg/dL — AB (ref 65–99)
POTASSIUM: 3.5 mmol/L (ref 3.5–5.1)
Sodium: 136 mmol/L (ref 135–145)

## 2015-05-18 LAB — MAGNESIUM: Magnesium: 1.3 mg/dL — ABNORMAL LOW (ref 1.7–2.4)

## 2015-05-18 MED ORDER — ATORVASTATIN CALCIUM 80 MG PO TABS
80.0000 mg | ORAL_TABLET | Freq: Every evening | ORAL | Status: DC
  Administered 2015-05-18 – 2015-05-20 (×3): 80 mg via ORAL
  Filled 2015-05-18 (×3): qty 1

## 2015-05-18 NOTE — Progress Notes (Signed)
PATIENT DETAILS Name: Valerie Rodriguez Age: 64 y.o. Sex: female Date of Birth: [birth year] Admit Date: 05/07/2015 Admitting Physician Leslye Peer, MD PCP:No PCP Per Patient  Subjective: Awake-interpreter at bedside-acknowledges but does not communicate with the interpreter  Assessment/Plan: Principal Problem: Acute respiratory failure with hypoxia: Secondary to aspiration pneumonia, COPD exacerbation. Required endotracheal intubation on admission. Completed IV antibiotics treatment on 8/2. Significantly less oxygen requirements compared to the past few days.  Active Problems: Healthcare associated pneumonia/aspiration pneumonia: Presented with respiratory failure requiring endotracheal intubation on admission. Required broad-spectrum antibiotics therapy, has completed antibiotics treatment on 8/2. Blood cultures negative. Remains afebrile without leukocytosis. Continue to monitor.  ? COPD exacerbation: Resolved. Completed steroids treatment. Continue nebulized bronchodilators.  Known history of dysphagia: Underwent extensive evaluation including FEES. Cautiously continue with a dysphagia 1 diet-spoke with daughter [name] over the phone-family accepting all risks. They would like patient to be on a dys 1 diet for comfort even after a peg tube is place.  IR consulted for PEG tube placement.  Hyponatremia: Likely secondary to dehydration. Resolved with IV fluids  Acute renal failure: Likely prerenal azotemia, resolved with IV fluids  Hypertension: Controlled-Continue hydralazine, atenolol  Type 2 diabetes: CBGs moderately controlled-continue Lantus 20 units along with SSI. Allow some permissive hyperglycemia-very frail patient with no significant oral intake at present.  History of CVA: Continue Plavix once PEG tube placed (last dose on 7/30-IR once the wound for 5 days prior to placement of PEG tube), resume statins   Generalized weakness, deaf-mute.   Palliative  care:DNR, family agreeable and excepting all risks associated with dysphagia 1 diet. Awaiting PEG tube placement. Family aware that patient will continue to aspirate irrespective of  PEG tube  Disposition: Remain inpatient-SNF in next 2 days  Antimicrobial agents  See below  Anti-infectives    Start     Dose/Rate Route Frequency Ordered Stop   05/12/15 1200  ampicillin-sulbactam (UNASYN) 1.5 g in sodium chloride 0.9 % 50 mL IVPB  Status:  Discontinued     1.5 g 100 mL/hr over 30 Minutes Intravenous Every 6 hours 05/12/15 1131 05/17/15 1019   05/09/15 1530  cefTRIAXone (ROCEPHIN) 2 g in dextrose 5 % 50 mL IVPB - Premix  Status:  Discontinued     2 g 100 mL/hr over 30 Minutes Intravenous Every 24 hours 05/09/15 1447 05/12/15 1130   05/09/15 1000  vancomycin (VANCOCIN) IVPB 1000 mg/200 mL premix  Status:  Discontinued     1,000 mg 200 mL/hr over 60 Minutes Intravenous Every 48 hours 05/07/15 0857 05/08/15 0840   05/08/15 1400  piperacillin-tazobactam (ZOSYN) IVPB 3.375 g  Status:  Discontinued     3.375 g 12.5 mL/hr over 240 Minutes Intravenous 3 times per day 05/08/15 0840 05/09/15 1446   05/08/15 1100  vancomycin (VANCOCIN) IVPB 1000 mg/200 mL premix  Status:  Discontinued     1,000 mg 200 mL/hr over 60 Minutes Intravenous Every 24 hours 05/08/15 0840 05/09/15 1446   05/07/15 1200  piperacillin-tazobactam (ZOSYN) IVPB 2.25 g  Status:  Discontinued     2.25 g 100 mL/hr over 30 Minutes Intravenous Every 6 hours 05/07/15 0846 05/08/15 0840   05/07/15 0930  vancomycin (VANCOCIN) 500 mg in sodium chloride 0.9 % 100 mL IVPB     500 mg 100 mL/hr over 60 Minutes Intravenous  Once 05/07/15 0857 05/07/15 1121   05/07/15 0830  vancomycin (VANCOCIN) IVPB 750 mg/150  ml premix  Status:  Discontinued     750 mg 150 mL/hr over 60 Minutes Intravenous Every 12 hours 05/07/15 0819 05/07/15 0844   05/07/15 0830  piperacillin-tazobactam (ZOSYN) IVPB 3.375 g  Status:  Discontinued     3.375 g 12.5  mL/hr over 240 Minutes Intravenous 3 times per day 05/07/15 0819 05/07/15 0843   05/07/15 0315  piperacillin-tazobactam (ZOSYN) IVPB 3.375 g     3.375 g 100 mL/hr over 30 Minutes Intravenous  Once 05/07/15 0312 05/07/15 0423   05/07/15 0315  vancomycin (VANCOCIN) IVPB 1000 mg/200 mL premix     1,000 mg 200 mL/hr over 60 Minutes Intravenous  Once 05/07/15 4132 05/07/15 0519      DVT Prophylaxis: Prophylactic Heparin  Code Status:  DNR  Family Communication Daughter-Bonnie over the phone  Procedures: None  CONSULTS:  pulmonary/intensive care and Palliative care and IR  Time spent 30 minutes-Greater than 50% of this time was spent in counseling, explanation of diagnosis, planning of further management, and coordination of care.  MEDICATIONS: Scheduled Meds: . antiseptic oral rinse  7 mL Mouth Rinse QID  . atenolol  50 mg Oral BID  . chlorhexidine  15 mL Mouth Rinse BID  . citalopram  20 mg Oral Daily  . [START ON 05/21/2015] clopidogrel  75 mg Oral Daily  . collagenase   Topical Daily  . heparin  5,000 Units Subcutaneous 3 times per day  . hydrALAZINE  25 mg Oral 3 times per day  . HYDROcodone-acetaminophen  1 tablet Oral 4 times per day  . insulin aspart  0-20 Units Subcutaneous 6 times per day  . insulin glargine  20 Units Subcutaneous Daily  . latanoprost  1 drop Both Eyes QHS  . pregabalin  75 mg Oral BID  . saccharomyces boulardii  250 mg Oral BID   Continuous Infusions:  PRN Meds:.sodium chloride, dextrose, hydrALAZINE, ipratropium-albuterol, labetalol, morphine injection, potassium chloride    PHYSICAL EXAM: Vital signs in last 24 hours: Filed Vitals:   05/17/15 0533 05/17/15 1300 05/17/15 2113 05/18/15 0526  BP: 144/54 112/42 134/45 135/51  Pulse: 59 60 63 60  Temp: 97.7 F (36.5 C) 98.9 F (37.2 C) 98.8 F (37.1 C) 98.2 F (36.8 C)  TempSrc: Oral Oral Oral Oral  Resp: 18 18 18 18   Height:      Weight:      SpO2: 100% 99% 100% 100%    Weight  change:  Filed Weights   05/10/15 0402 05/10/15 0404 05/11/15 0401  Weight: 89.6 kg (197 lb 8.5 oz) 89.6 kg (197 lb 8.5 oz) 87.4 kg (192 lb 10.9 oz)   Body mass index is 36.43 kg/(m^2).   Gen Exam: Awake, deaf/mute-acknowledges interpreter but does not communicate. Neck: Supple, No JVD.  Chest: B/L Clear anteriorly CVS: S1 S2 Regular, no murmurs.  Abdomen: soft, BS +, non tender, non distended.  Extremities: no edema, lower extremities warm to touch. Neurologic: Non Focal-but with generalized weakness  Intake/Output from previous day:  Intake/Output Summary (Last 24 hours) at 05/18/15 1323 Last data filed at 05/18/15 0900  Gross per 24 hour  Intake 3291.75 ml  Output    350 ml  Net 2941.75 ml     LAB RESULTS: CBC  Recent Labs Lab 05/13/15 0620 05/15/15 0619 05/18/15 0605  WBC 7.7 7.9 7.2  HGB 10.2* 9.3* 9.2*  HCT 31.1* 29.0* 28.6*  PLT 121* 182 229  MCV 79.9 79.9 80.6  MCH 26.2 25.6* 25.9*  MCHC 32.8 32.1 32.2  RDW 17.1* 17.4* 17.9*    Chemistries   Recent Labs Lab 05/13/15 0620 05/14/15 0427 05/15/15 0619 05/17/15 0605 05/18/15 0605  NA 139 139 144 140 136  K 3.4* 3.1* 3.5 3.4* 3.5  CL 109 110 113* 106 105  CO2 24 23 23 26 24   GLUCOSE 215* 169* 179* 169* 206*  BUN 26* 21* 19 9 7   CREATININE 0.93 1.00 1.11* 0.94 0.89  CALCIUM 8.4* 8.7* 9.0 8.9 9.0  MG 1.4*  --  2.0  --  1.3*    CBG:  Recent Labs Lab 05/17/15 1953 05/17/15 2353 05/18/15 0409 05/18/15 0809 05/18/15 1159  GLUCAP 219* 192* 214* 194* 221*    GFR Estimated Creatinine Clearance: 65 mL/min (by C-G formula based on Cr of 0.89).  Coagulation profile  Recent Labs Lab 05/12/15 1414 05/14/15 0759  INR 1.12 1.08    Cardiac Enzymes No results for input(s): CKMB, TROPONINI, MYOGLOBIN in the last 168 hours.  Invalid input(s): CK  Invalid input(s): POCBNP No results for input(s): DDIMER in the last 72 hours. No results for input(s): HGBA1C in the last 72 hours. No results  for input(s): CHOL, HDL, LDLCALC, TRIG, CHOLHDL, LDLDIRECT in the last 72 hours. No results for input(s): TSH, T4TOTAL, T3FREE, THYROIDAB in the last 72 hours.  Invalid input(s): FREET3 No results for input(s): VITAMINB12, FOLATE, FERRITIN, TIBC, IRON, RETICCTPCT in the last 72 hours. No results for input(s): LIPASE, AMYLASE in the last 72 hours.  Urine Studies No results for input(s): UHGB, CRYS in the last 72 hours.  Invalid input(s): UACOL, UAPR, USPG, UPH, UTP, UGL, UKET, UBIL, UNIT, UROB, ULEU, UEPI, UWBC, URBC, UBAC, CAST, UCOM, BILUA  MICROBIOLOGY: No results found for this or any previous visit (from the past 240 hour(s)).  RADIOLOGY STUDIES/RESULTS: Dg Chest Port 1 View  05/10/2015   CLINICAL DATA:  Check endotracheal tube position  EXAM: PORTABLE CHEST - 1 VIEW  COMPARISON:  Radiograph 05/09/2015  FINDINGS: Endotrachea tube is 4 cm from carina. NG tube extends into the stomach. Interval improvement in aeration lung bases. Central venous congestion. No pneumothorax.  IMPRESSION: 1. Endotracheal tube appears in good position. 2. Increased aeration to lung bases.   Electronically Signed   By: Genevive Bi M.D.   On: 05/10/2015 16:48   Dg Chest Port 1 View  05/09/2015   CLINICAL DATA:  Acute respiratory failure  EXAM: PORTABLE CHEST - 1 VIEW  COMPARISON:  05/07/2015  FINDINGS: Cardiac shadow is stable. Patient is somewhat rotated to the right. The overall inspiratory effort is poor. No focal confluent infiltrate is seen. An endotracheal tube and nasogastric catheter are again seen and stable. No acute bony abnormality is noted.  IMPRESSION: Poor inspiratory effort without acute abnormality.   Electronically Signed   By: Alcide Clever M.D.   On: 05/09/2015 07:29   Dg Chest Port 1 View  05/07/2015   CLINICAL DATA:  Recent endotracheal readjustment  EXAM: PORTABLE CHEST - 1 VIEW  COMPARISON:  04/27/2015  FINDINGS: The endotracheal tube now lies approximately 2 cm above the carina. A  nasogastric catheter is seen within the stomach. Cardiac shadow is stable. Persistent right basilar atelectasis is noted. No other focal abnormality is noted.  IMPRESSION: Stable right basilar atelectasis.  The endotracheal tube as described.   Electronically Signed   By: Alcide Clever M.D.   On: 05/07/2015 11:14   Dg Chest Port 1 View  05/07/2015   CLINICAL DATA:  Check endotracheal tube placement 05/07/2015  EXAM: PORTABLE  CHEST - 1 VIEW  COMPARISON:  None.  FINDINGS: Endotracheal tube is again noted 12 mm above the carina and should be withdrawn 2 cm. A nasogastric catheter is noted within the stomach although the proximal side port lies in the distal esophagus. Elevation of the right hemidiaphragm with right basilar atelectasis is noted. The cardiac shadow is stable.  IMPRESSION: Endotracheal tube still 12 mm above the carina. This should be withdrawn approximately 2 cm.  Nasogastric catheter as described.  Increasing right basilar atelectasis.   Electronically Signed   By: Alcide Clever M.D.   On: 05/07/2015 09:06   Dg Chest Port 1 View  05/07/2015   CLINICAL DATA:  Endotracheal tube placement.  Initial encounter.  EXAM: PORTABLE CHEST - 1 VIEW  COMPARISON:  Chest radiograph performed earlier today at 3:01 a.m.  FINDINGS: The patient's endotracheal tube is noted extending just below the carina, overlying the right mainstem bronchus. This could be retracted approximately 3 cm. The enteric tube is noted extending below the diaphragm, with the side port noted about the distal esophagus. This could be advanced 8 cm, as deemed clinically appropriate.  The lungs are hypoexpanded. Right perihilar airspace opacity may reflect atelectasis, with mild right-sided volume loss noted. No pleural effusion or pneumothorax is seen.  The cardiomediastinal silhouette is borderline normal in size. No acute osseous abnormalities identified.  IMPRESSION: 1. Endotracheal tube seen ending just below the carina, overlying the right  mainstem bronchus. This could be retracted approximately 3 cm. 2. Enteric tube seen extending below the diaphragm, with the side port about the distal esophagus. This could be advanced 8 cm, is deemed clinically appropriate. 3. Lungs hypoexpanded. Right perihilar airspace opacity may reflect atelectasis, with mild right-sided volume loss noted. These results were called by telephone at the time of interpretation on 05/07/2015 at 5:48 am to Dr. Loren Racer, who verbally acknowledged these results.   Electronically Signed   By: Roanna Raider M.D.   On: 05/07/2015 05:48   Dg Chest Port 1 View  05/07/2015   CLINICAL DATA:  Altered mental status, acute onset. Initial encounter.  EXAM: PORTABLE CHEST - 1 VIEW  COMPARISON:  Chest radiograph performed 04/08/2015  FINDINGS: The lungs remain hypoexpanded. Left basilar airspace opacity may reflect atelectasis or possibly mild pneumonia. No definite pleural effusion or pneumothorax is seen.  The cardiomediastinal silhouette is enlarged. No acute osseous abnormalities are identified.  IMPRESSION: Lungs remain hypoexpanded. Left basilar airspace opacity may reflect atelectasis or possibly mild pneumonia. Cardiomegaly noted.   Electronically Signed   By: Roanna Raider M.D.   On: 05/07/2015 03:44   Dg Abd Portable 1v  05/09/2015   CLINICAL DATA:  Ileus. Abdominal distention. Nasogastric tube placement  EXAM: PORTABLE ABDOMEN - 1 VIEW  COMPARISON:  05/07/2015  FINDINGS: A nasogastric tube remains in place with tip again seen overlying the expected location of the transverse duodenum. Generalized gaseous distention of colon is again demonstrated, consistent with mild ileus. No dilated small bowel loops demonstrated.  IMPRESSION: Nasogastric tube tip again seen overlying the expected location of the transverse duodenum.  Persistent colonic distention, suspicious for ileus.   Electronically Signed   By: Myles Rosenthal M.D.   On: 05/09/2015 15:33   Dg Abd Portable  1v  05/07/2015   CLINICAL DATA:  Orogastric tube position  EXAM: PORTABLE ABDOMEN - 1 VIEW  COMPARISON:  Chest radiograph same date  FINDINGS: Tip of orogastric tube terminates over the expected location of the third portion of the  duodenum. Right upper quadrant clips are noted. Normal bowel gas pattern.  IMPRESSION: Feeding tube tip terminates over the expected location of the third portion of the duodenum.   Electronically Signed   By: Christiana Pellant M.D.   On: 05/07/2015 11:19    Jeoffrey Massed, MD  [facility] Pager:[phone]  If 7PM-7AM, please contact night-coverage [email or web address] Password [email or web address] 05/18/2015, 1:23 PM   LOS: 11 days

## 2015-05-18 NOTE — H&P (Signed)
Chief Complaint: Patient was seen in consultation today for malnutrition, dysphagia Chief Complaint  Patient presents with  . Altered Mental Status  . Fever   at the request of [facility]  Referring Physician(s): [facility]  History of Present Illness: Valerie Rodriguez is a 64 y.o. female with recurrent acute on chronic respiratory failure secondary to COPD and aspiration pneumonia- Extubated 05/11/15, now off Unasyn without fevers. Patient also with severe dehydration and malnutrition. IR received request for percutaneous gastrostomy tube. History is obtained per chart review and from the patient's daughter Valerie Rodriguez over the phone. Per RN patient has been able to swallow pills with applesauce.   Past Medical History  Diagnosis Date  . Hypertension   . Hyperlipemia   . Deaf-mutism   . Glaucoma     Dr Eulah Pont  . Diabetes mellitus     Type II, sees Dr. Candie Chroman  . Allergy   . Asthma     sees Dr Sebastopol Callas  . Stroke 11-05-10    sees Dr. Pearlean Brownie  . Chronic kidney disease     sees Dr. Camille Bal   . PVD (peripheral vascular disease)     sees Dr. Hart Rochester   . PAD (peripheral artery disease)     Past Surgical History  Procedure Laterality Date  . Cholecystectomy    . Abdominal hysterectomy      oophorectomy 1976  . Rotator cuff repair  2004  . Colonoscopy  03-30-09    per Dr. Marina Goodell, poor prep, repeat one yr     Allergies: Review of patient's allergies indicates no known allergies.  Medications: Prior to Admission medications   Medication Sig Start Date End Date Taking? Authorizing Provider  albuterol (PROVENTIL HFA;VENTOLIN HFA) 108 (90 BASE) MCG/ACT inhaler Inhale 2 puffs into the lungs every 6 (six) hours as needed for wheezing or shortness of breath.   Yes Historical Provider, MD  albuterol-ipratropium (COMBIVENT) 18-103 MCG/ACT inhaler Inhale 2 puffs into the lungs 4 (four) times daily. 01/06/14  Yes Nelwyn Salisbury, MD  amoxicillin-clavulanate (AUGMENTIN) 875-125 MG per tablet  Take 1 tablet by mouth 2 (two) times daily.   Yes Historical Provider, MD  atenolol (TENORMIN) 50 MG tablet Take 1 tablet (50 mg total) by mouth daily. 01/06/14  Yes Nelwyn Salisbury, MD  atorvastatin (LIPITOR) 80 MG tablet Take 1 tablet (80 mg total) by mouth daily. Patient taking differently: Take 80 mg by mouth every evening.  01/06/14  Yes Nelwyn Salisbury, MD  azelastine (ASTELIN) 137 MCG/SPRAY nasal spray Place 1 spray into both nostrils 2 (two) times daily. Use in each nostril as directed 01/06/14  Yes Nelwyn Salisbury, MD  budesonide-formoterol [facility]) 160-4.5 MCG/ACT inhaler Inhale 2 puffs into the lungs 2 (two) times daily. 01/06/14  Yes Nelwyn Salisbury, MD  citalopram (CELEXA) 10 MG tablet Take 20 mg by mouth daily.    Yes Historical Provider, MD  clopidogrel (PLAVIX) 75 MG tablet Take 1 tablet (75 mg total) by mouth daily. 01/06/14  Yes Nelwyn Salisbury, MD  docusate sodium (COLACE) 100 MG capsule Take 100 mg by mouth every morning.   Yes Historical Provider, MD  enalapril (VASOTEC) 2.5 MG tablet Take 2.5 mg by mouth every morning.  04/06/14  Yes Historical Provider, MD  ferrous sulfate 325 (65 FE) MG tablet Take 325 mg by mouth every morning.   Yes Historical Provider, MD  fluticasone (FLONASE) 50 MCG/ACT nasal spray Place 2 sprays into both nostrils daily. 01/06/14  Yes Jeannett Senior  Marguerita Beards, MD  Fluticasone Furoate-Vilanterol 100-25 MCG/INH AEPB Inhale 1 puff into the lungs every morning.   Yes Historical Provider, MD  furosemide (LASIX) 20 MG tablet Take 1 tablet (20 mg total) by mouth daily. 01/01/14  Yes Nelwyn Salisbury, MD  hydrALAZINE (APRESOLINE) 50 MG tablet Take 1 tablet (50 mg total) by mouth 2 (two) times daily. 01/06/14  Yes Nelwyn Salisbury, MD  HYDROcodone-acetaminophen (NORCO/VICODIN) 5-325 MG per tablet Take 1 tablet by mouth 2 (two) times daily. 04/12/15  Yes Dorothea Ogle, MD  Insulin Detemir (LEVEMIR FLEXPEN) 100 UNIT/ML Pen Inject 10 Units into the skin every evening.   Yes Historical Provider, MD    insulin glargine (LANTUS) 100 UNIT/ML injection Inject 0.2 mLs (20 Units total) into the skin at bedtime. 01/14/13  Yes Richarda Overlie, MD  insulin lispro (HUMALOG) 100 UNIT/ML KiwkPen Inject 10 Units into the skin 3 (three) times daily before meals.   Yes Historical Provider, MD  ipratropium-albuterol (DUONEB) 0.5-2.5 (3) MG/3ML SOLN Take 3 mLs by nebulization every 4 (four) hours.    Yes Historical Provider, MD  latanoprost (XALATAN) 0.005 % ophthalmic solution Place 1 drop into both eyes at bedtime.     Yes Historical Provider, MD  montelukast (SINGULAIR) 10 MG tablet Take 1 tablet (10 mg total) by mouth at bedtime. 01/06/14  Yes Nelwyn Salisbury, MD  pantoprazole (PROTONIX) 40 MG tablet Take 1 tablet (40 mg total) by mouth daily. 01/06/14  Yes Nelwyn Salisbury, MD  polyethylene glycol [facility] / GLYCOLAX) packet Take 17 g by mouth every morning.   Yes Historical Provider, MD  pregabalin (LYRICA) 200 MG capsule Take 1 capsule (200 mg total) by mouth 2 (two) times daily. 01/06/14  Yes Nelwyn Salisbury, MD  saccharomyces boulardii (FLORASTOR) 250 MG capsule Take 250 mg by mouth 2 (two) times daily.   Yes Historical Provider, MD  Vitamin D, Ergocalciferol, (DRISDOL) 50000 UNITS CAPS capsule Take 50,000 Units by mouth every 30 (thirty) days.   Yes Historical Provider, MD     Family History  Problem Relation Age of Onset  . Hypertension      family hx  . Coronary artery disease      female 1st degree relative  . Hyperlipidemia      family hx  . Diabetes      1st degree relative  . Diabetes Mother   . Heart disease Mother   . Hypertension Mother   . Hyperlipidemia Mother     History   Social History  . Marital Status: Widowed    Spouse Name: N/A  . Number of Children: N/A  . Years of Education: N/A   Social History Main Topics  . Smoking status: Former Smoker    Types: Cigarettes    Quit date: 12/21/1979  . Smokeless tobacco: Never Used  . Alcohol Use: No  . Drug Use: No  . Sexual Activity:  Not on file   Other Topics Concern  . None   Social History Narrative   Review of Systems: A 12 point ROS discussed and pertinent positives are indicated in the HPI above.  All other systems are negative.  Review of Systems  Vital Signs: BP 135/51 mmHg  Pulse 60  Temp(Src) 98.2 F (36.8 C) (Oral)  Resp 18  Ht 5\' 1"  (1.549 m)  Wt 192 lb 10.9 oz (87.4 kg)  BMI 36.43 kg/m2  SpO2 100%  Physical Exam General: Awake, NAD, mute Heart: RRR without M/G/R Lungs: CTA b/l  Abd: Soft, NT, ND, (+) BS  Mallampati Score:  MD Evaluation Airway: WNL Heart: WNL Abdomen: WNL Chest/ Lungs: WNL ASA  Classification: 3 Mallampati/Airway Score: Two  Imaging: Dg Chest Port 1 View  05/10/2015   CLINICAL DATA:  Check endotracheal tube position  EXAM: PORTABLE CHEST - 1 VIEW  COMPARISON:  Radiograph 05/09/2015  FINDINGS: Endotrachea tube is 4 cm from carina. NG tube extends into the stomach. Interval improvement in aeration lung bases. Central venous congestion. No pneumothorax.  IMPRESSION: 1. Endotracheal tube appears in good position. 2. Increased aeration to lung bases.   Electronically Signed   By: Genevive Bi M.D.   On: 05/10/2015 16:48   Dg Chest Port 1 View  05/09/2015   CLINICAL DATA:  Acute respiratory failure  EXAM: PORTABLE CHEST - 1 VIEW  COMPARISON:  05/07/2015  FINDINGS: Cardiac shadow is stable. Patient is somewhat rotated to the right. The overall inspiratory effort is poor. No focal confluent infiltrate is seen. An endotracheal tube and nasogastric catheter are again seen and stable. No acute bony abnormality is noted.  IMPRESSION: Poor inspiratory effort without acute abnormality.   Electronically Signed   By: Alcide Clever M.D.   On: 05/09/2015 07:29   Dg Chest Port 1 View  05/07/2015   CLINICAL DATA:  Recent endotracheal readjustment  EXAM: PORTABLE CHEST - 1 VIEW  COMPARISON:  04/27/2015  FINDINGS: The endotracheal tube now lies approximately 2 cm above the carina. A  nasogastric catheter is seen within the stomach. Cardiac shadow is stable. Persistent right basilar atelectasis is noted. No other focal abnormality is noted.  IMPRESSION: Stable right basilar atelectasis.  The endotracheal tube as described.   Electronically Signed   By: Alcide Clever M.D.   On: 05/07/2015 11:14   Dg Chest Port 1 View  05/07/2015   CLINICAL DATA:  Check endotracheal tube placement 05/07/2015  EXAM: PORTABLE CHEST - 1 VIEW  COMPARISON:  None.  FINDINGS: Endotracheal tube is again noted 12 mm above the carina and should be withdrawn 2 cm. A nasogastric catheter is noted within the stomach although the proximal side port lies in the distal esophagus. Elevation of the right hemidiaphragm with right basilar atelectasis is noted. The cardiac shadow is stable.  IMPRESSION: Endotracheal tube still 12 mm above the carina. This should be withdrawn approximately 2 cm.  Nasogastric catheter as described.  Increasing right basilar atelectasis.   Electronically Signed   By: Alcide Clever M.D.   On: 05/07/2015 09:06   Dg Chest Port 1 View  05/07/2015   CLINICAL DATA:  Endotracheal tube placement.  Initial encounter.  EXAM: PORTABLE CHEST - 1 VIEW  COMPARISON:  Chest radiograph performed earlier today at 3:01 a.m.  FINDINGS: The patient's endotracheal tube is noted extending just below the carina, overlying the right mainstem bronchus. This could be retracted approximately 3 cm. The enteric tube is noted extending below the diaphragm, with the side port noted about the distal esophagus. This could be advanced 8 cm, as deemed clinically appropriate.  The lungs are hypoexpanded. Right perihilar airspace opacity may reflect atelectasis, with mild right-sided volume loss noted. No pleural effusion or pneumothorax is seen.  The cardiomediastinal silhouette is borderline normal in size. No acute osseous abnormalities identified.  IMPRESSION: 1. Endotracheal tube seen ending just below the carina, overlying the right  mainstem bronchus. This could be retracted approximately 3 cm. 2. Enteric tube seen extending below the diaphragm, with the side port about the distal esophagus. This could  be advanced 8 cm, is deemed clinically appropriate. 3. Lungs hypoexpanded. Right perihilar airspace opacity may reflect atelectasis, with mild right-sided volume loss noted. These results were called by telephone at the time of interpretation on 05/07/2015 at 5:48 am to Dr. Loren Racer, who verbally acknowledged these results.   Electronically Signed   By: Roanna Raider M.D.   On: 05/07/2015 05:48   Dg Chest Port 1 View  05/07/2015   CLINICAL DATA:  Altered mental status, acute onset. Initial encounter.  EXAM: PORTABLE CHEST - 1 VIEW  COMPARISON:  Chest radiograph performed 04/08/2015  FINDINGS: The lungs remain hypoexpanded. Left basilar airspace opacity may reflect atelectasis or possibly mild pneumonia. No definite pleural effusion or pneumothorax is seen.  The cardiomediastinal silhouette is enlarged. No acute osseous abnormalities are identified.  IMPRESSION: Lungs remain hypoexpanded. Left basilar airspace opacity may reflect atelectasis or possibly mild pneumonia. Cardiomegaly noted.   Electronically Signed   By: Roanna Raider M.D.   On: 05/07/2015 03:44   Dg Abd Portable 1v  05/09/2015   CLINICAL DATA:  Ileus. Abdominal distention. Nasogastric tube placement  EXAM: PORTABLE ABDOMEN - 1 VIEW  COMPARISON:  05/07/2015  FINDINGS: A nasogastric tube remains in place with tip again seen overlying the expected location of the transverse duodenum. Generalized gaseous distention of colon is again demonstrated, consistent with mild ileus. No dilated small bowel loops demonstrated.  IMPRESSION: Nasogastric tube tip again seen overlying the expected location of the transverse duodenum.  Persistent colonic distention, suspicious for ileus.   Electronically Signed   By: Myles Rosenthal M.D.   On: 05/09/2015 15:33   Dg Abd Portable  1v  05/07/2015   CLINICAL DATA:  Orogastric tube position  EXAM: PORTABLE ABDOMEN - 1 VIEW  COMPARISON:  Chest radiograph same date  FINDINGS: Tip of orogastric tube terminates over the expected location of the third portion of the duodenum. Right upper quadrant clips are noted. Normal bowel gas pattern.  IMPRESSION: Feeding tube tip terminates over the expected location of the third portion of the duodenum.   Electronically Signed   By: Christiana Pellant M.D.   On: 05/07/2015 11:19    Labs:  CBC:  Recent Labs  05/10/15 0525 05/13/15 0620 05/15/15 0619 05/18/15 0605  WBC 7.9 7.7 7.9 7.2  HGB 8.9* 10.2* 9.3* 9.2*  HCT 28.9* 31.1* 29.0* 28.6*  PLT 121* 121* 182 229    COAGS:  Recent Labs  04/08/15 2310 05/07/15 1135 05/12/15 1414 05/14/15 0759  INR 1.10 1.46 1.12 1.08  APTT 23*  --   --   --     BMP:  Recent Labs  05/14/15 0427 05/15/15 0619 05/17/15 0605 05/18/15 0605  NA 139 144 140 136  K 3.1* 3.5 3.4* 3.5  CL 110 113* 106 105  CO2 23 23 26 24   GLUCOSE 169* 179* 169* 206*  BUN 21* 19 9 7   CALCIUM 8.7* 9.0 8.9 9.0  CREATININE 1.00 1.11* 0.94 0.89  GFRNONAA 59* 52* >60 >60  GFRAA >60 60* >60 >60    LIVER FUNCTION TESTS:  Recent Labs  04/08/15 1558 04/08/15 2310 04/09/15 0220 05/07/15 0257  BILITOT 0.5 0.6 0.4 0.7  AST 33 41 37 51*  ALT 15 16 16 18   ALKPHOS 62 57 57 61  PROT 7.4 6.9 6.9 7.2  ALBUMIN 3.2* 3.1* 3.0* 2.6*    Assessment and Plan: Recurrent acute on chronic respiratory failure secondary to COPD and aspiration pneumonia- Extubated 05/11/15, now off Unasyn, afebrile, wbc  wnl  Severe Dehydration  Malnutrition, dysphagia  Request for percutaneous gastrostomy tube Imaging reviewed and patient's anatomy is amendable to percutaneous approach The patient will be NPO, sq heparin to be held, Plavix last dose 05/14/15, labs and vitals have been reviewed. Risks and Benefits discussed with the patient's daughter Valerie Rodriguez including, but not limited to  the need for a barium enema during the procedure, bleeding, infection, peritonitis, or damage to adjacent structures. All questions were answered, patient's duaghter is agreeable to proceed. Consent signed and in chart.   Thank you for this interesting consult.  I greatly enjoyed meeting DARIANNA AMY and look forward to participating in their care.  A copy of this report was sent to the requesting provider on this date.  SignedBerneta Levins 05/18/2015, 12:37 PM   I spent a total of 20 Minutes in face to face in clinical consultation, greater than 50% of which was counseling/coordinating care for malnutrition.

## 2015-05-19 DIAGNOSIS — E46 Unspecified protein-calorie malnutrition: Secondary | ICD-10-CM

## 2015-05-19 LAB — GLUCOSE, CAPILLARY
GLUCOSE-CAPILLARY: 103 mg/dL — AB (ref 65–99)
GLUCOSE-CAPILLARY: 122 mg/dL — AB (ref 65–99)
GLUCOSE-CAPILLARY: 138 mg/dL — AB (ref 65–99)
GLUCOSE-CAPILLARY: 131 mg/dL — AB (ref 65–99)
Glucose-Capillary: 120 mg/dL — ABNORMAL HIGH (ref 65–99)
Glucose-Capillary: 133 mg/dL — ABNORMAL HIGH (ref 65–99)

## 2015-05-19 MED ORDER — CEFAZOLIN SODIUM-DEXTROSE 2-3 GM-% IV SOLR: 2.0000 g | INTRAVENOUS | Status: AC

## 2015-05-19 MED ORDER — CEFAZOLIN SODIUM-DEXTROSE 2-3 GM-% IV SOLR: 2.0000 g | Freq: Once | INTRAVENOUS | Status: DC

## 2015-05-19 NOTE — Plan of Care (Signed)
Problem: Phase II Progression Outcomes Goal: Obtain order to discontinue catheter if appropriate Outcome: Not Met (add Reason) Pt. Has chronic foley  Problem: Phase III Progression Outcomes Goal: Foley discontinued Outcome: Not Met (add Reason) Pt. Has chronic foley

## 2015-05-19 NOTE — Progress Notes (Signed)
PATIENT DETAILS Name: Valerie Rodriguez Age: 64 y.o. Sex: female Date of Birth: 06/09/1951 Admit Date: 05/07/2015 Admitting Physician Leslye Peer, MD PCP:No PCP Per Patient  Subjective: Much more awake-interpreter at bedside-communicates much more today. Shakes head no-when asked if she is pain  Assessment/Plan: Principal Problem: Acute respiratory failure with hypoxia: Secondary to aspiration pneumonia, COPD exacerbation. Required endotracheal intubation on admission. Completed IV antibiotics treatment on 8/2. Significantly less oxygen requirements compared to the past few days.   Active Problems: Healthcare associated pneumonia/aspiration pneumonia: Presented with respiratory failure requiring endotracheal intubation on admission. Required broad-spectrum antibiotics therapy, has completed antibiotics treatment on 8/2. Blood cultures negative. Remains afebrile without leukocytosis. Continue to monitor.  ? COPD exacerbation: Resolved. Completed steroids treatment. Continue nebulized bronchodilators.  Known history of dysphagia: Underwent extensive evaluation including FEES. Cautiously continue with a dysphagia 1 diet-spoke with daughter [name] over the phone on 8/3-family accepting all risks. They would like patient to be on a dys 1 diet for comfort even after a peg tube is place.  IR consulted for PEG tube placement-likely will be placed later today.  Hyponatremia: Likely secondary to dehydration. Resolved with IV fluids  Acute renal failure: Likely prerenal azotemia, resolved with IV fluids  Hypertension: Controlled-Continue hydralazine, atenolol  Type 2 diabetes: CBGs moderately controlled-continue Lantus 20 units along with SSI. Allow some permissive hyperglycemia-very frail patient with no significant oral intake at present.  History of CVA: Continue Plavix once PEG tube placed (last dose on 7/30-IR once the wound for 5 days prior to placement of PEG tube),  resume statins   Generalized weakness, deaf-mute.   Palliative care:DNR, family agreeable and excepting all risks associated with dysphagia 1 diet. Awaiting PEG tube placement. Family aware that patient will continue to aspirate irrespective of  PEG tube  Disposition: Remain inpatient-SNF in next 2 days  Antimicrobial agents  See below  Anti-infectives    Start     Dose/Rate Route Frequency Ordered Stop   05/12/15 1200  ampicillin-sulbactam (UNASYN) 1.5 g in sodium chloride 0.9 % 50 mL IVPB  Status:  Discontinued     1.5 g 100 mL/hr over 30 Minutes Intravenous Every 6 hours 05/12/15 1131 05/17/15 1019   05/09/15 1530  cefTRIAXone (ROCEPHIN) 2 g in dextrose 5 % 50 mL IVPB - Premix  Status:  Discontinued     2 g 100 mL/hr over 30 Minutes Intravenous Every 24 hours 05/09/15 1447 05/12/15 1130   05/09/15 1000  vancomycin (VANCOCIN) IVPB 1000 mg/200 mL premix  Status:  Discontinued     1,000 mg 200 mL/hr over 60 Minutes Intravenous Every 48 hours 05/07/15 0857 05/08/15 0840   05/08/15 1400  piperacillin-tazobactam (ZOSYN) IVPB 3.375 g  Status:  Discontinued     3.375 g 12.5 mL/hr over 240 Minutes Intravenous 3 times per day 05/08/15 0840 05/09/15 1446   05/08/15 1100  vancomycin (VANCOCIN) IVPB 1000 mg/200 mL premix  Status:  Discontinued     1,000 mg 200 mL/hr over 60 Minutes Intravenous Every 24 hours 05/08/15 0840 05/09/15 1446   05/07/15 1200  piperacillin-tazobactam (ZOSYN) IVPB 2.25 g  Status:  Discontinued     2.25 g 100 mL/hr over 30 Minutes Intravenous Every 6 hours 05/07/15 0846 05/08/15 0840   05/07/15 0930  vancomycin (VANCOCIN) 500 mg in sodium chloride 0.9 % 100 mL IVPB     500 mg 100 mL/hr over 60 Minutes Intravenous  Once  05/07/15 0857 05/07/15 1121   05/07/15 0830  vancomycin (VANCOCIN) IVPB 750 mg/150 ml premix  Status:  Discontinued     750 mg 150 mL/hr over 60 Minutes Intravenous Every 12 hours 05/07/15 0819 05/07/15 0844   05/07/15 0830  piperacillin-tazobactam  (ZOSYN) IVPB 3.375 g  Status:  Discontinued     3.375 g 12.5 mL/hr over 240 Minutes Intravenous 3 times per day 05/07/15 0819 05/07/15 0843   05/07/15 0315  piperacillin-tazobactam (ZOSYN) IVPB 3.375 g     3.375 g 100 mL/hr over 30 Minutes Intravenous  Once 05/07/15 0312 05/07/15 0423   05/07/15 0315  vancomycin (VANCOCIN) IVPB 1000 mg/200 mL premix     1,000 mg 200 mL/hr over 60 Minutes Intravenous  Once 05/07/15 1610 05/07/15 0519      DVT Prophylaxis: Prophylactic Heparin  Code Status:  DNR  Family Communication None at bedside  Procedures: None  CONSULTS:  pulmonary/intensive care and Palliative care and IR  Time spent 20 minutes-Greater than 50% of this time was spent in counseling, explanation of diagnosis, planning of further management, and coordination of care.  MEDICATIONS: Scheduled Meds: . antiseptic oral rinse  7 mL Mouth Rinse QID  . atenolol  50 mg Oral BID  . atorvastatin  80 mg Oral QPM  . chlorhexidine  15 mL Mouth Rinse BID  . citalopram  20 mg Oral Daily  . [START ON 05/21/2015] clopidogrel  75 mg Oral Daily  . collagenase   Topical Daily  . hydrALAZINE  25 mg Oral 3 times per day  . HYDROcodone-acetaminophen  1 tablet Oral 4 times per day  . insulin aspart  0-20 Units Subcutaneous 6 times per day  . insulin glargine  20 Units Subcutaneous Daily  . latanoprost  1 drop Both Eyes QHS  . pregabalin  75 mg Oral BID  . saccharomyces boulardii  250 mg Oral BID   Continuous Infusions:  PRN Meds:.sodium chloride, dextrose, hydrALAZINE, ipratropium-albuterol, labetalol, morphine injection, potassium chloride    PHYSICAL EXAM: Vital signs in last 24 hours: Filed Vitals:   05/18/15 0526 05/18/15 1323 05/18/15 2119 05/19/15 0454  BP: 135/51 118/43 122/53 135/47  Pulse: 60 66 63 61  Temp: 98.2 F (36.8 C) 98.6 F (37 C) 98.7 F (37.1 C) 97.8 F (36.6 C)  TempSrc: Oral Oral Oral Oral  Resp: 18 18 18 18   Height:      Weight:      SpO2: 100% 100%  99% 100%    Weight change:  Filed Weights   05/10/15 0402 05/10/15 0404 05/11/15 0401  Weight: 89.6 kg (197 lb 8.5 oz) 89.6 kg (197 lb 8.5 oz) 87.4 kg (192 lb 10.9 oz)   Body mass index is 36.43 kg/(m^2).   Gen Exam: Awake, deaf/mute-acknowledges interpreter much more today. Shakes head yes/no Neck: Supple, No JVD.  Chest: B/L Clear anteriorly CVS: S1 S2 Regular, no murmurs.  Abdomen: soft, BS +, non tender, non distended.  Extremities: no edema, lower extremities warm to touch. Neurologic: Non Focal-but with generalized weakness  Intake/Output from previous day:  Intake/Output Summary (Last 24 hours) at 05/19/15 1028 Last data filed at 05/19/15 0833  Gross per 24 hour  Intake    236 ml  Output   1850 ml  Net  -1614 ml     LAB RESULTS: CBC  Recent Labs Lab 05/13/15 0620 05/15/15 0619 05/18/15 0605  WBC 7.7 7.9 7.2  HGB 10.2* 9.3* 9.2*  HCT 31.1* 29.0* 28.6*  PLT 121* 182 229  MCV 79.9 79.9 80.6  MCH 26.2 25.6* 25.9*  MCHC 32.8 32.1 32.2  RDW 17.1* 17.4* 17.9*    Chemistries   Recent Labs Lab 05/13/15 0620 05/14/15 0427 05/15/15 0619 05/17/15 0605 05/18/15 0605  NA 139 139 144 140 136  K 3.4* 3.1* 3.5 3.4* 3.5  CL 109 110 113* 106 105  CO2 24 23 23 26 24   GLUCOSE 215* 169* 179* 169* 206*  BUN 26* 21* 19 9 7   CREATININE 0.93 1.00 1.11* 0.94 0.89  CALCIUM 8.4* 8.7* 9.0 8.9 9.0  MG 1.4*  --  2.0  --  1.3*    CBG:  Recent Labs Lab 05/18/15 1608 05/18/15 2027 05/18/15 2356 05/19/15 0409 05/19/15 0748  GLUCAP 170* 226* 204* 103* 122*    GFR Estimated Creatinine Clearance: 65 mL/min (by C-G formula based on Cr of 0.89).  Coagulation profile  Recent Labs Lab 05/12/15 1414 05/14/15 0759  INR 1.12 1.08    Cardiac Enzymes No results for input(s): CKMB, TROPONINI, MYOGLOBIN in the last 168 hours.  Invalid input(s): CK  Invalid input(s): POCBNP No results for input(s): DDIMER in the last 72 hours. No results for input(s): HGBA1C in  the last 72 hours. No results for input(s): CHOL, HDL, LDLCALC, TRIG, CHOLHDL, LDLDIRECT in the last 72 hours. No results for input(s): TSH, T4TOTAL, T3FREE, THYROIDAB in the last 72 hours.  Invalid input(s): FREET3 No results for input(s): VITAMINB12, FOLATE, FERRITIN, TIBC, IRON, RETICCTPCT in the last 72 hours. No results for input(s): LIPASE, AMYLASE in the last 72 hours.  Urine Studies No results for input(s): UHGB, CRYS in the last 72 hours.  Invalid input(s): UACOL, UAPR, USPG, UPH, UTP, UGL, UKET, UBIL, UNIT, UROB, ULEU, UEPI, UWBC, URBC, UBAC, CAST, UCOM, BILUA  MICROBIOLOGY: No results found for this or any previous visit (from the past 240 hour(s)).  RADIOLOGY STUDIES/RESULTS: Dg Chest Port 1 View  05/10/2015   CLINICAL DATA:  Check endotracheal tube position  EXAM: PORTABLE CHEST - 1 VIEW  COMPARISON:  Radiograph 05/09/2015  FINDINGS: Endotrachea tube is 4 cm from carina. NG tube extends into the stomach. Interval improvement in aeration lung bases. Central venous congestion. No pneumothorax.  IMPRESSION: 1. Endotracheal tube appears in good position. 2. Increased aeration to lung bases.   Electronically Signed   By: Genevive Bi M.D.   On: 05/10/2015 16:48   Dg Chest Port 1 View  05/09/2015   CLINICAL DATA:  Acute respiratory failure  EXAM: PORTABLE CHEST - 1 VIEW  COMPARISON:  05/07/2015  FINDINGS: Cardiac shadow is stable. Patient is somewhat rotated to the right. The overall inspiratory effort is poor. No focal confluent infiltrate is seen. An endotracheal tube and nasogastric catheter are again seen and stable. No acute bony abnormality is noted.  IMPRESSION: Poor inspiratory effort without acute abnormality.   Electronically Signed   By: Alcide Clever M.D.   On: 05/09/2015 07:29   Dg Chest Port 1 View  05/07/2015   CLINICAL DATA:  Recent endotracheal readjustment  EXAM: PORTABLE CHEST - 1 VIEW  COMPARISON:  04/27/2015  FINDINGS: The endotracheal tube now lies  approximately 2 cm above the carina. A nasogastric catheter is seen within the stomach. Cardiac shadow is stable. Persistent right basilar atelectasis is noted. No other focal abnormality is noted.  IMPRESSION: Stable right basilar atelectasis.  The endotracheal tube as described.   Electronically Signed   By: Alcide Clever M.D.   On: 05/07/2015 11:14   Dg Chest [facility] 1 [address]  05/07/2015   CLINICAL DATA:  Check endotracheal tube placement 05/07/2015  EXAM: PORTABLE CHEST - 1 VIEW  COMPARISON:  None.  FINDINGS: Endotracheal tube is again noted 12 mm above the carina and should be withdrawn 2 cm. A nasogastric catheter is noted within the stomach although the proximal side port lies in the distal esophagus. Elevation of the right hemidiaphragm with right basilar atelectasis is noted. The cardiac shadow is stable.  IMPRESSION: Endotracheal tube still 12 mm above the carina. This should be withdrawn approximately 2 cm.  Nasogastric catheter as described.  Increasing right basilar atelectasis.   Electronically Signed   By: Alcide Clever M.D.   On: 05/07/2015 09:06   Dg Chest Port 1 View  05/07/2015   CLINICAL DATA:  Endotracheal tube placement.  Initial encounter.  EXAM: PORTABLE CHEST - 1 VIEW  COMPARISON:  Chest radiograph performed earlier today at 3:01 a.m.  FINDINGS: The patient's endotracheal tube is noted extending just below the carina, overlying the right mainstem bronchus. This could be retracted approximately 3 cm. The enteric tube is noted extending below the diaphragm, with the side port noted about the distal esophagus. This could be advanced 8 cm, as deemed clinically appropriate.  The lungs are hypoexpanded. Right perihilar airspace opacity may reflect atelectasis, with mild right-sided volume loss noted. No pleural effusion or pneumothorax is seen.  The cardiomediastinal silhouette is borderline normal in size. No acute osseous abnormalities identified.  IMPRESSION: 1. Endotracheal tube seen ending  just below the carina, overlying the right mainstem bronchus. This could be retracted approximately 3 cm. 2. Enteric tube seen extending below the diaphragm, with the side port about the distal esophagus. This could be advanced 8 cm, is deemed clinically appropriate. 3. Lungs hypoexpanded. Right perihilar airspace opacity may reflect atelectasis, with mild right-sided volume loss noted. These results were called by telephone at the time of interpretation on 05/07/2015 at 5:48 am to Dr. Loren Racer, who verbally acknowledged these results.   Electronically Signed   By: Roanna Raider M.D.   On: 05/07/2015 05:48   Dg Chest Port 1 View  05/07/2015   CLINICAL DATA:  Altered mental status, acute onset. Initial encounter.  EXAM: PORTABLE CHEST - 1 VIEW  COMPARISON:  Chest radiograph performed 04/08/2015  FINDINGS: The lungs remain hypoexpanded. Left basilar airspace opacity may reflect atelectasis or possibly mild pneumonia. No definite pleural effusion or pneumothorax is seen.  The cardiomediastinal silhouette is enlarged. No acute osseous abnormalities are identified.  IMPRESSION: Lungs remain hypoexpanded. Left basilar airspace opacity may reflect atelectasis or possibly mild pneumonia. Cardiomegaly noted.   Electronically Signed   By: Roanna Raider M.D.   On: 05/07/2015 03:44   Dg Abd Portable 1v  05/09/2015   CLINICAL DATA:  Ileus. Abdominal distention. Nasogastric tube placement  EXAM: PORTABLE ABDOMEN - 1 VIEW  COMPARISON:  05/07/2015  FINDINGS: A nasogastric tube remains in place with tip again seen overlying the expected location of the transverse duodenum. Generalized gaseous distention of colon is again demonstrated, consistent with mild ileus. No dilated small bowel loops demonstrated.  IMPRESSION: Nasogastric tube tip again seen overlying the expected location of the transverse duodenum.  Persistent colonic distention, suspicious for ileus.   Electronically Signed   By: Myles Rosenthal M.D.   On:  05/09/2015 15:33   Dg Abd Portable 1v  05/07/2015   CLINICAL DATA:  Orogastric tube position  EXAM: PORTABLE ABDOMEN - 1 VIEW  COMPARISON:  Chest radiograph same date  FINDINGS: Tip  of orogastric tube terminates over the expected location of the third portion of the duodenum. Right upper quadrant clips are noted. Normal bowel gas pattern.  IMPRESSION: Feeding tube tip terminates over the expected location of the third portion of the duodenum.   Electronically Signed   By: Christiana Pellant M.D.   On: 05/07/2015 11:19    Jeoffrey Massed, MD  [facility] Pager:[phone]  If 7PM-7AM, please contact night-coverage [email or web address] Password [email or web address] 05/19/2015, 10:28 AM   LOS: 12 days

## 2015-05-20 ENCOUNTER — Inpatient Hospital Stay (HOSPITAL_COMMUNITY): Payer: Medicare Other

## 2015-05-20 LAB — GLUCOSE, CAPILLARY
GLUCOSE-CAPILLARY: 115 mg/dL — AB (ref 65–99)
GLUCOSE-CAPILLARY: 133 mg/dL — AB (ref 65–99)
GLUCOSE-CAPILLARY: 139 mg/dL — AB (ref 65–99)
GLUCOSE-CAPILLARY: 85 mg/dL (ref 65–99)
Glucose-Capillary: 175 mg/dL — ABNORMAL HIGH (ref 65–99)
Glucose-Capillary: 124 mg/dL — ABNORMAL HIGH (ref 65–99)

## 2015-05-20 MED ORDER — LIDOCAINE HCL 1 % IJ SOLN
INTRAMUSCULAR | Status: AC
Start: 2015-05-20 — End: 2015-05-20
  Filled 2015-05-20: qty 20

## 2015-05-20 MED ORDER — MIDAZOLAM HCL 2 MG/2ML IJ SOLN
INTRAMUSCULAR | Status: AC | PRN
  Administered 2015-05-20: 0.5 mg via INTRAVENOUS

## 2015-05-20 MED ORDER — CEFAZOLIN SODIUM-DEXTROSE 2-3 GM-% IV SOLR
INTRAVENOUS | Status: AC
  Filled 2015-05-20: qty 50

## 2015-05-20 MED ORDER — IOHEXOL 300 MG/ML  SOLN
20.0000 mL | Freq: Once | INTRAMUSCULAR | Status: AC | PRN
  Administered 2015-05-20: 50 mL

## 2015-05-20 MED ORDER — FENTANYL CITRATE (PF) 100 MCG/2ML IJ SOLN
INTRAMUSCULAR | Status: AC
  Filled 2015-05-20: qty 2

## 2015-05-20 MED ORDER — MIDAZOLAM HCL 2 MG/2ML IJ SOLN
INTRAMUSCULAR | Status: AC
Start: 2015-05-20 — End: 2015-05-20
  Filled 2015-05-20: qty 2

## 2015-05-20 MED ORDER — FENTANYL CITRATE (PF) 100 MCG/2ML IJ SOLN
INTRAMUSCULAR | Status: AC | PRN
  Administered 2015-05-20: 25 ug via INTRAVENOUS

## 2015-05-20 MED ORDER — GLUCAGON HCL RDNA (DIAGNOSTIC) 1 MG IJ SOLR
INTRAMUSCULAR | Status: AC
  Administered 2015-05-20: 11:00:00
  Filled 2015-05-20: qty 1

## 2015-05-20 NOTE — Clinical Social Work Note (Signed)
Clinical Social Worker continuing to follow patient and family for support and discharge planning needs.  Patient is scheduled for a PEG today.  Per MD, patient will likely be ready for discharge on Saturday.  CSW contacted [employer] who is in agreement with patient return over the weekend.  CSW remains available for support and to facilitate patient discharge needs.  Macario Golds, [place] [phone]

## 2015-05-20 NOTE — Progress Notes (Signed)
Nutrition Follow-up  DOCUMENTATION CODES:   Obesity unspecified  INTERVENTION:   -Magic Cup TID  -Once TF is initiated, recommend:  Initiate Glucerna @ 20 ml/hr via PEG and increase by 10 ml every 4 hours to goal rate of 60 ml/hr.   Tube feeding regimen provides 1728 kcal (100% of needs), 86 grams of protein, and 1159 ml of H2O.   NUTRITION DIAGNOSIS:   Inadequate oral intake related to dysphagia as evidenced by meal completion < 25%.  Ongoing  GOAL:   Patient will meet greater than or equal to 90% of their needs  Unmet  MONITOR:   PO intake, Supplement acceptance, Diet advancement, Labs, Weight trends, Skin, I & O's  REASON FOR ASSESSMENT:   Consult Enteral/tube feeding initiation and management  ASSESSMENT:   Patient admitted on 7/23 with acute hypoxemic respiratory failure. Hx of being blind and deaf, and recent PNA.  Pt down in UR fr PEG placement at time of visit. TF recommendations have been provided.   Pt currently NPO for procedure; previously on a dysphagia 1 diet with honey thick liquids. Intake remains poor; PO: 0-45%.   Palliative care following; pt with poor prognosis. Pt family continues to accept risk of continued aspiration.   CSW following. Likely d/c back to [facility] on Saturday, 05/21/15.  Diet Order:  Diet NPO time specified Except for: Sips with Meds  Skin:  Wound (see comment) (st II lt heel, st III rt heel, UN scarum and gluteal crease,)  Last BM:  05/14/15  Height:   Ht Readings from Last 1 Encounters:  05/07/15  (1.549 m)    Weight:   Wt Readings from Last 1 Encounters:  05/11/15 192 lb 10.9 oz (87.4 kg)    Ideal Body Weight:  47.7 kg  BMI:  Body mass index is 36.43 kg/(m^2).  Estimated Nutritional Needs:   Kcal:  1600-1800  Protein:  85-100 grams  Fluid:  1.6-1.8 L  EDUCATION NEEDS:   No education needs identified at this time  Demarques Pilz A. Mayford Knife, RD, LDN, CDE Pager: [phone] After hours  Pager: [phone]

## 2015-05-20 NOTE — Procedures (Signed)
Interventional Radiology Procedure Note  Procedure: Placement of percutaneous 20F pull-through gastrostomy tube. Complications: None Recommendations: - NPO except for sips and chips remainder of today and overnight - Maintain G-tube to LWS until tomorrow morning  - May advance diet as tolerated and begin using tube tomorrow morning  Signed,  Heath K. McCullough, MD   

## 2015-05-20 NOTE — Care Management Important Message (Signed)
Important Message  Patient Details  Name: Valerie Rodriguez MRN: [id number] Date of Birth: [birth year]   Medicare Important Message Given:  Other (see comment) 5th IM given    Lawerance Sabal, RN 05/20/2015, 10:29 AMImportant Message  Patient Details  Name: Valerie Rodriguez MRN: [id number] Date of Birth: 06/01/1951   Medicare Important Message Given:  Other (see comment)    Lawerance Sabal, RN 05/20/2015, 10:29 AM

## 2015-05-20 NOTE — Progress Notes (Signed)
PATIENT DETAILS Name: Valerie Rodriguez Age: 64 y.o. Sex: female Date of Birth: 03-25-1951 Admit Date: 05/07/2015 Admitting Physician Leslye Peer, MD PCP:No PCP Per Patient  Subjective: No major issues overnight-awake-acknowledges me. Awaiting Peg tube placement  Assessment/Plan: Principal Problem: Acute respiratory failure with hypoxia: Resolved.Secondary to aspiration pneumonia, COPD exacerbation. Required endotracheal intubation on admission. Completed IV antibiotics treatment on 8/2.On just 2L of O2 via [place].  Active Problems: Healthcare associated pneumonia/aspiration pneumonia: Presented with respiratory failure requiring endotracheal intubation on admission. Required broad-spectrum antibiotics therapy, has completed antibiotics treatment on 8/2. Blood cultures negative. Remains afebrile without leukocytosis. Continue to monitor.  ? COPD exacerbation: Resolved. Completed steroids treatment. Continue nebulized bronchodilators.  Known history of dysphagia: Underwent extensive evaluation including FEES. Cautiously continue with a dysphagia 1 diet-spoke with daughter [name] over the phone on 8/3-family accepting all risks. They would like patient to be on a dys 1 diet for comfort even after a peg tube is place.  IR consulted awaiting PEG tube placement.  Hyponatremia: Likely secondary to dehydration. Resolved with IV fluids  Acute renal failure: Likely prerenal azotemia, resolved with IV fluids  Hypertension: Controlled-Continue hydralazine, atenolol  Type 2 diabetes: CBGs moderately controlled-continue Lantus 20 units along with SSI. Allow some permissive hyperglycemia-very frail patient with no significant oral intake at present.  History of CVA: Continue Plavix once PEG tube placed (last dose on 7/30-IR once the wound for 5 days prior to placement of PEG tube), resume statins   Generalized weakness, deaf-mute.   Palliative care:DNR, family agreeable and  excepting all risks associated with dysphagia 1 diet. Awaiting PEG tube placement. Family aware that patient will continue to aspirate irrespective of  PEG tube  Disposition: Remain inpatient-SNF tomorrow-once peg feeds started  Antimicrobial agents  See below  Anti-infectives    Start     Dose/Rate Route Frequency Ordered Stop   05/20/15 0800  ceFAZolin (ANCEF) IVPB 2 g/50 mL premix     2 g 100 mL/hr over 30 Minutes Intravenous To Radiology 05/19/15 2020 05/21/15 0800   05/19/15 2030  ceFAZolin (ANCEF) IVPB 2 g/50 mL premix  Status:  Discontinued     2 g 100 mL/hr over 30 Minutes Intravenous  Once 05/19/15 2019 05/19/15 2020   05/12/15 1200  ampicillin-sulbactam (UNASYN) 1.5 g in sodium chloride 0.9 % 50 mL IVPB  Status:  Discontinued     1.5 g 100 mL/hr over 30 Minutes Intravenous Every 6 hours 05/12/15 1131 05/17/15 1019   05/09/15 1530  cefTRIAXone (ROCEPHIN) 2 g in dextrose 5 % 50 mL IVPB - Premix  Status:  Discontinued     2 g 100 mL/hr over 30 Minutes Intravenous Every 24 hours 05/09/15 1447 05/12/15 1130   05/09/15 1000  vancomycin (VANCOCIN) IVPB 1000 mg/200 mL premix  Status:  Discontinued     1,000 mg 200 mL/hr over 60 Minutes Intravenous Every 48 hours 05/07/15 0857 05/08/15 0840   05/08/15 1400  piperacillin-tazobactam (ZOSYN) IVPB 3.375 g  Status:  Discontinued     3.375 g 12.5 mL/hr over 240 Minutes Intravenous 3 times per day 05/08/15 0840 05/09/15 1446   05/08/15 1100  vancomycin (VANCOCIN) IVPB 1000 mg/200 mL premix  Status:  Discontinued     1,000 mg 200 mL/hr over 60 Minutes Intravenous Every 24 hours 05/08/15 0840 05/09/15 1446   05/07/15 1200  piperacillin-tazobactam (ZOSYN) IVPB 2.25 g  Status:  Discontinued  2.25 g 100 mL/hr over 30 Minutes Intravenous Every 6 hours 05/07/15 0846 05/08/15 0840   05/07/15 0930  vancomycin (VANCOCIN) 500 mg in sodium chloride 0.9 % 100 mL IVPB     500 mg 100 mL/hr over 60 Minutes Intravenous  Once 05/07/15 0857 05/07/15  1121   05/07/15 0830  vancomycin (VANCOCIN) IVPB 750 mg/150 ml premix  Status:  Discontinued     750 mg 150 mL/hr over 60 Minutes Intravenous Every 12 hours 05/07/15 0819 05/07/15 0844   05/07/15 0830  piperacillin-tazobactam (ZOSYN) IVPB 3.375 g  Status:  Discontinued     3.375 g 12.5 mL/hr over 240 Minutes Intravenous 3 times per day 05/07/15 0819 05/07/15 0843   05/07/15 0315  piperacillin-tazobactam (ZOSYN) IVPB 3.375 g     3.375 g 100 mL/hr over 30 Minutes Intravenous  Once 05/07/15 0312 05/07/15 0423   05/07/15 0315  vancomycin (VANCOCIN) IVPB 1000 mg/200 mL premix     1,000 mg 200 mL/hr over 60 Minutes Intravenous  Once 05/07/15 4540 05/07/15 0519      DVT Prophylaxis: Prophylactic Heparin  Code Status:  DNR  Family Communication None at bedside  Procedures: None  CONSULTS:  pulmonary/intensive care and Palliative care and IR  Time spent 20 minutes-Greater than 50% of this time was spent in counseling, explanation of diagnosis, planning of further management, and coordination of care.  MEDICATIONS: Scheduled Meds: . antiseptic oral rinse  7 mL Mouth Rinse QID  . atenolol  50 mg Oral BID  . atorvastatin  80 mg Oral QPM  .  ceFAZolin (ANCEF) IV  2 g Intravenous to XRAY  . chlorhexidine  15 mL Mouth Rinse BID  . citalopram  20 mg Oral Daily  . [START ON 05/21/2015] clopidogrel  75 mg Oral Daily  . collagenase   Topical Daily  . hydrALAZINE  25 mg Oral 3 times per day  . HYDROcodone-acetaminophen  1 tablet Oral 4 times per day  . insulin aspart  0-20 Units Subcutaneous 6 times per day  . insulin glargine  20 Units Subcutaneous Daily  . latanoprost  1 drop Both Eyes QHS  . lidocaine      . pregabalin  75 mg Oral BID  . saccharomyces boulardii  250 mg Oral BID   Continuous Infusions:  PRN Meds:.sodium chloride, dextrose, hydrALAZINE, ipratropium-albuterol, labetalol, morphine injection, potassium chloride    PHYSICAL EXAM: Vital signs in last 24 hours: Filed  Vitals:   05/19/15 2025 05/19/15 2133 05/20/15 0520 05/20/15 0613  BP:  125/44 133/58   Pulse:  64 59 61  Temp:  97.8 F (36.6 C) 97.9 F (36.6 C)   TempSrc:  Oral Oral   Resp:  18 18   Height:      Weight:      SpO2: 100% 100% 100% 100%    Weight change:  Filed Weights   05/10/15 0402 05/10/15 0404 05/11/15 0401  Weight: 89.6 kg (197 lb 8.5 oz) 89.6 kg (197 lb 8.5 oz) 87.4 kg (192 lb 10.9 oz)   Body mass index is 36.43 kg/(m^2).   Gen Exam: Awake, deaf/mute-acknowledges interpreter much more today. Shakes head yes/no Neck: Supple, No JVD.  Chest: B/L Clear anteriorly CVS: S1 S2 Regular, no murmurs.  Abdomen: soft, BS +, non tender, non distended.  Extremities: no edema, lower extremities warm to touch. Neurologic: Non Focal-but with generalized weakness  Intake/Output from previous day:  Intake/Output Summary (Last 24 hours) at 05/20/15 1029 Last data filed at 05/20/15 1009  Gross per 24 hour  Intake      0 ml  Output   1450 ml  Net  -1450 ml     LAB RESULTS: CBC  Recent Labs Lab 05/15/15 0619 05/18/15 0605  WBC 7.9 7.2  HGB 9.3* 9.2*  HCT 29.0* 28.6*  PLT 182 229  MCV 79.9 80.6  MCH 25.6* 25.9*  MCHC 32.1 32.2  RDW 17.4* 17.9*    Chemistries   Recent Labs Lab 05/14/15 0427 05/15/15 0619 05/17/15 0605 05/18/15 0605  NA 139 144 140 136  K 3.1* 3.5 3.4* 3.5  CL 110 113* 106 105  CO2 23 23 26 24   GLUCOSE 169* 179* 169* 206*  BUN 21* 19 9 7   CREATININE 1.00 1.11* 0.94 0.89  CALCIUM 8.7* 9.0 8.9 9.0  MG  --  2.0  --  1.3*    CBG:  Recent Labs Lab 05/19/15 1849 05/19/15 2002 05/20/15 0004 05/20/15 0403 05/20/15 0755  GLUCAP 138* 131* 115* 133* 139*    GFR Estimated Creatinine Clearance: 65 mL/min (by C-G formula based on Cr of 0.89).  Coagulation profile  Recent Labs Lab 05/14/15 0759  INR 1.08    Cardiac Enzymes No results for input(s): CKMB, TROPONINI, MYOGLOBIN in the last 168 hours.  Invalid input(s): CK  Invalid  input(s): POCBNP No results for input(s): DDIMER in the last 72 hours. No results for input(s): HGBA1C in the last 72 hours. No results for input(s): CHOL, HDL, LDLCALC, TRIG, CHOLHDL, LDLDIRECT in the last 72 hours. No results for input(s): TSH, T4TOTAL, T3FREE, THYROIDAB in the last 72 hours.  Invalid input(s): FREET3 No results for input(s): VITAMINB12, FOLATE, FERRITIN, TIBC, IRON, RETICCTPCT in the last 72 hours. No results for input(s): LIPASE, AMYLASE in the last 72 hours.  Urine Studies No results for input(s): UHGB, CRYS in the last 72 hours.  Invalid input(s): UACOL, UAPR, USPG, UPH, UTP, UGL, UKET, UBIL, UNIT, UROB, ULEU, UEPI, UWBC, URBC, UBAC, CAST, UCOM, BILUA  MICROBIOLOGY: No results found for this or any previous visit (from the past 240 hour(s)).  RADIOLOGY STUDIES/RESULTS: Dg Chest Port 1 View  05/10/2015   CLINICAL DATA:  Check endotracheal tube position  EXAM: PORTABLE CHEST - 1 VIEW  COMPARISON:  Radiograph 05/09/2015  FINDINGS: Endotrachea tube is 4 cm from carina. NG tube extends into the stomach. Interval improvement in aeration lung bases. Central venous congestion. No pneumothorax.  IMPRESSION: 1. Endotracheal tube appears in good position. 2. Increased aeration to lung bases.   Electronically Signed   By: Genevive Bi M.D.   On: 05/10/2015 16:48   Dg Chest Port 1 View  05/09/2015   CLINICAL DATA:  Acute respiratory failure  EXAM: PORTABLE CHEST - 1 VIEW  COMPARISON:  05/07/2015  FINDINGS: Cardiac shadow is stable. Patient is somewhat rotated to the right. The overall inspiratory effort is poor. No focal confluent infiltrate is seen. An endotracheal tube and nasogastric catheter are again seen and stable. No acute bony abnormality is noted.  IMPRESSION: Poor inspiratory effort without acute abnormality.   Electronically Signed   By: Alcide Clever M.D.   On: 05/09/2015 07:29   Dg Chest Port 1 View  05/07/2015   CLINICAL DATA:  Recent endotracheal readjustment   EXAM: PORTABLE CHEST - 1 VIEW  COMPARISON:  04/27/2015  FINDINGS: The endotracheal tube now lies approximately 2 cm above the carina. A nasogastric catheter is seen within the stomach. Cardiac shadow is stable. Persistent right basilar atelectasis is noted. No other focal  abnormality is noted.  IMPRESSION: Stable right basilar atelectasis.  The endotracheal tube as described.   Electronically Signed   By: Alcide Clever M.D.   On: 05/07/2015 11:14   Dg Chest Port 1 View  05/07/2015   CLINICAL DATA:  Check endotracheal tube placement 05/07/2015  EXAM: PORTABLE CHEST - 1 VIEW  COMPARISON:  None.  FINDINGS: Endotracheal tube is again noted 12 mm above the carina and should be withdrawn 2 cm. A nasogastric catheter is noted within the stomach although the proximal side port lies in the distal esophagus. Elevation of the right hemidiaphragm with right basilar atelectasis is noted. The cardiac shadow is stable.  IMPRESSION: Endotracheal tube still 12 mm above the carina. This should be withdrawn approximately 2 cm.  Nasogastric catheter as described.  Increasing right basilar atelectasis.   Electronically Signed   By: Alcide Clever M.D.   On: 05/07/2015 09:06   Dg Chest Port 1 View  05/07/2015   CLINICAL DATA:  Endotracheal tube placement.  Initial encounter.  EXAM: PORTABLE CHEST - 1 VIEW  COMPARISON:  Chest radiograph performed earlier today at 3:01 a.m.  FINDINGS: The patient's endotracheal tube is noted extending just below the carina, overlying the right mainstem bronchus. This could be retracted approximately 3 cm. The enteric tube is noted extending below the diaphragm, with the side port noted about the distal esophagus. This could be advanced 8 cm, as deemed clinically appropriate.  The lungs are hypoexpanded. Right perihilar airspace opacity may reflect atelectasis, with mild right-sided volume loss noted. No pleural effusion or pneumothorax is seen.  The cardiomediastinal silhouette is borderline normal in  size. No acute osseous abnormalities identified.  IMPRESSION: 1. Endotracheal tube seen ending just below the carina, overlying the right mainstem bronchus. This could be retracted approximately 3 cm. 2. Enteric tube seen extending below the diaphragm, with the side port about the distal esophagus. This could be advanced 8 cm, is deemed clinically appropriate. 3. Lungs hypoexpanded. Right perihilar airspace opacity may reflect atelectasis, with mild right-sided volume loss noted. These results were called by telephone at the time of interpretation on 05/07/2015 at 5:48 am to Dr. Loren Racer, who verbally acknowledged these results.   Electronically Signed   By: Roanna Raider M.D.   On: 05/07/2015 05:48   Dg Chest Port 1 View  05/07/2015   CLINICAL DATA:  Altered mental status, acute onset. Initial encounter.  EXAM: PORTABLE CHEST - 1 VIEW  COMPARISON:  Chest radiograph performed 04/08/2015  FINDINGS: The lungs remain hypoexpanded. Left basilar airspace opacity may reflect atelectasis or possibly mild pneumonia. No definite pleural effusion or pneumothorax is seen.  The cardiomediastinal silhouette is enlarged. No acute osseous abnormalities are identified.  IMPRESSION: Lungs remain hypoexpanded. Left basilar airspace opacity may reflect atelectasis or possibly mild pneumonia. Cardiomegaly noted.   Electronically Signed   By: Roanna Raider M.D.   On: 05/07/2015 03:44   Dg Abd Portable 1v  05/09/2015   CLINICAL DATA:  Ileus. Abdominal distention. Nasogastric tube placement  EXAM: PORTABLE ABDOMEN - 1 VIEW  COMPARISON:  05/07/2015  FINDINGS: A nasogastric tube remains in place with tip again seen overlying the expected location of the transverse duodenum. Generalized gaseous distention of colon is again demonstrated, consistent with mild ileus. No dilated small bowel loops demonstrated.  IMPRESSION: Nasogastric tube tip again seen overlying the expected location of the transverse duodenum.  Persistent  colonic distention, suspicious for ileus.   Electronically Signed   By: Myles Rosenthal  M.D.   On: 05/09/2015 15:33   Dg Abd Portable 1v  05/07/2015   CLINICAL DATA:  Orogastric tube position  EXAM: PORTABLE ABDOMEN - 1 VIEW  COMPARISON:  Chest radiograph same date  FINDINGS: Tip of orogastric tube terminates over the expected location of the third portion of the duodenum. Right upper quadrant clips are noted. Normal bowel gas pattern.  IMPRESSION: Feeding tube tip terminates over the expected location of the third portion of the duodenum.   Electronically Signed   By: Christiana Pellant M.D.   On: 05/07/2015 11:19    Jeoffrey Massed, MD  [facility] Pager:[phone]  If 7PM-7AM, please contact night-coverage [email or web address] Password [email or web address] 05/20/2015, 10:29 AM   LOS: 13 days

## 2015-05-20 NOTE — Sedation Documentation (Signed)
Patient is resting comfortably. 

## 2015-05-20 NOTE — Progress Notes (Signed)
Utilization Review completed. Gwenith Tschida RN BSN CM 

## 2015-05-21 LAB — GLUCOSE, CAPILLARY
GLUCOSE-CAPILLARY: 133 mg/dL — AB (ref 65–99)
GLUCOSE-CAPILLARY: 88 mg/dL (ref 65–99)
Glucose-Capillary: 151 mg/dL — ABNORMAL HIGH (ref 65–99)
Glucose-Capillary: 93 mg/dL (ref 65–99)
Glucose-Capillary: 102 mg/dL — ABNORMAL HIGH (ref 65–99)
Glucose-Capillary: 120 mg/dL — ABNORMAL HIGH (ref 65–99)

## 2015-05-21 MED ORDER — HYDROCODONE-ACETAMINOPHEN 5-325 MG PO TABS: 1.0000 | ORAL_TABLET | Freq: Four times a day (QID) | ORAL | Status: AC

## 2015-05-21 MED ORDER — ATENOLOL 50 MG PO TABS
50.0000 mg | ORAL_TABLET | Freq: Two times a day (BID) | ORAL | Status: DC
  Administered 2015-05-21 – 2015-05-22 (×2): 50 mg
  Filled 2015-05-21 (×2): qty 1

## 2015-05-21 MED ORDER — GLUCERNA 1.2 CAL PO LIQD: 1000.0000 mL | ORAL | Status: AC

## 2015-05-21 MED ORDER — CITALOPRAM HYDROBROMIDE 10 MG PO TABS: 20.0000 mg | ORAL_TABLET | Freq: Every day | ORAL | Status: AC

## 2015-05-21 MED ORDER — INSULIN ASPART 100 UNIT/ML ~~LOC~~ SOLN: 0.0000 [IU] | SUBCUTANEOUS | Status: AC

## 2015-05-21 MED ORDER — CITALOPRAM HYDROBROMIDE 20 MG PO TABS
20.0000 mg | ORAL_TABLET | Freq: Every day | ORAL | Status: DC
  Administered 2015-05-22: 20 mg
  Filled 2015-05-21: qty 1

## 2015-05-21 MED ORDER — ATENOLOL 50 MG PO TABS: 50.0000 mg | ORAL_TABLET | Freq: Two times a day (BID) | ORAL | Status: DC

## 2015-05-21 MED ORDER — ATENOLOL 50 MG PO TABS: 50.0000 mg | ORAL_TABLET | Freq: Every day | ORAL | Status: DC

## 2015-05-21 MED ORDER — VITAMIN D (ERGOCALCIFEROL) 1.25 MG (50000 UNIT) PO CAPS: 50000.0000 [IU] | ORAL_CAPSULE | ORAL | Status: AC

## 2015-05-21 MED ORDER — DOCUSATE SODIUM 100 MG PO CAPS: 100.0000 mg | ORAL_CAPSULE | ORAL | Status: AC

## 2015-05-21 MED ORDER — CLOPIDOGREL BISULFATE 75 MG PO TABS: 75.0000 mg | ORAL_TABLET | Freq: Every day | ORAL | Status: AC

## 2015-05-21 MED ORDER — INSULIN ASPART 100 UNIT/ML ~~LOC~~ SOLN
0.0000 [IU] | SUBCUTANEOUS | Status: DC
  Administered 2015-05-22: 3 [IU] via SUBCUTANEOUS
  Administered 2015-05-22: 2 [IU] via SUBCUTANEOUS
  Administered 2015-05-22: 3 [IU] via SUBCUTANEOUS
  Administered 2015-05-22: 2 [IU] via SUBCUTANEOUS

## 2015-05-21 MED ORDER — PREGABALIN 75 MG PO CAPS: 75.0000 mg | ORAL_CAPSULE | Freq: Two times a day (BID) | ORAL | Status: AC

## 2015-05-21 MED ORDER — ATENOLOL 50 MG PO TABS: 50.0000 mg | ORAL_TABLET | Freq: Two times a day (BID) | ORAL | Status: AC

## 2015-05-21 MED ORDER — MONTELUKAST SODIUM 10 MG PO TABS: 10.0000 mg | ORAL_TABLET | Freq: Every day | ORAL | Status: AC

## 2015-05-21 MED ORDER — HYDROCODONE-ACETAMINOPHEN 5-325 MG PO TABS
1.0000 | ORAL_TABLET | Freq: Four times a day (QID) | ORAL | Status: DC
  Administered 2015-05-21 – 2015-05-22 (×4): 1
  Filled 2015-05-21 (×4): qty 1

## 2015-05-21 MED ORDER — HYDRALAZINE HCL 50 MG PO TABS: 50.0000 mg | ORAL_TABLET | Freq: Two times a day (BID) | ORAL | Status: AC

## 2015-05-21 MED ORDER — ATORVASTATIN CALCIUM 80 MG PO TABS
80.0000 mg | ORAL_TABLET | Freq: Every evening | ORAL | Status: DC
  Administered 2015-05-21: 80 mg
  Filled 2015-05-21: qty 1

## 2015-05-21 MED ORDER — FREE WATER: 100.0000 mL | Freq: Three times a day (TID) | Status: AC

## 2015-05-21 MED ORDER — HYDRALAZINE HCL 25 MG PO TABS
25.0000 mg | ORAL_TABLET | Freq: Three times a day (TID) | ORAL | Status: DC
  Administered 2015-05-21 – 2015-05-22 (×3): 25 mg
  Filled 2015-05-21 (×3): qty 1

## 2015-05-21 MED ORDER — FREE WATER
100.0000 mL | Freq: Three times a day (TID) | Status: DC
  Administered 2015-05-21 – 2015-05-22 (×3): 100 mL

## 2015-05-21 MED ORDER — ATORVASTATIN CALCIUM 80 MG PO TABS: 80.0000 mg | ORAL_TABLET | Freq: Every day | ORAL | Status: AC

## 2015-05-21 MED ORDER — CLOPIDOGREL BISULFATE 75 MG PO TABS
75.0000 mg | ORAL_TABLET | Freq: Every day | ORAL | Status: DC
  Administered 2015-05-22: 75 mg
  Filled 2015-05-21: qty 1

## 2015-05-21 MED ORDER — COLLAGENASE 250 UNIT/GM EX OINT: TOPICAL_OINTMENT | Freq: Every day | CUTANEOUS | Status: AC

## 2015-05-21 MED ORDER — ATORVASTATIN CALCIUM 80 MG PO TABS: 80.0000 mg | ORAL_TABLET | Freq: Every evening | ORAL | Status: DC

## 2015-05-21 MED ORDER — POLYETHYLENE GLYCOL 3350 17 G PO PACK: 17.0000 g | PACK | ORAL | Status: AC

## 2015-05-21 MED ORDER — FERROUS SULFATE 325 (65 FE) MG PO TABS: 325.0000 mg | ORAL_TABLET | ORAL | Status: AC

## 2015-05-21 MED ORDER — GLUCERNA 1.2 CAL PO LIQD
1000.0000 mL | ORAL | Status: DC
  Administered 2015-05-21: 1000 mL
  Filled 2015-05-21 (×6): qty 1000

## 2015-05-21 MED ORDER — CITALOPRAM HYDROBROMIDE 20 MG PO TABS: 20.0000 mg | ORAL_TABLET | Freq: Every day | ORAL | Status: DC

## 2015-05-21 NOTE — Social Work (Signed)
CSW has been in contact with [facility] throughout the day in order to coordinate patient transport for patient to facility. Facility identified that they needed formula until Monday in order to take patient on today. CSW contacted RN who contacted pharmacy in order to provide the patient with sufficient formula for the next 2 days. After CSW coordinated transport with [facility], facility contacted CSW identifying that they do not have a pump for feeding. Hospital could not supply one therefore, transport cancelled and patient will remain until a facility that can manage her feeding needs can be identified.  Beverly Sessions MSW, LCSW [phone]

## 2015-05-21 NOTE — Discharge Summary (Addendum)
PATIENT DETAILS Name: Valerie Rodriguez Age: 64 y.o. Sex: female Date of Birth: 1951/04/03 MRN: [id number]. Admitting Physician: Leslye Peer, MD PCP:No PCP Per Patient  Admit Date: 05/07/2015 Discharge date: 05/22/2015  Recommendations for Outpatient Follow-up:  1. Please repeat CBC and chemistries at next visit 2. PEG tube placed-please slowly increase tube feedings to goal (see below) 3. Please obtain SLP evaluation at SNF-family would like to continue dysphagia 1 diet for comfort. 4. DNR status 5. Please obtain palliative care follow-up while at SNF  PRIMARY DISCHARGE DIAGNOSIS:  Principal Problem:   Acute respiratory failure with hypoxia Active Problems:   DM2 (diabetes mellitus, type 2)   HCAP (healthcare-associated pneumonia)   Dysphagia   History of CVA with residual deficit   Hypernatremia   Altered mental status   Palliative care encounter   DNR (do not resuscitate)   Protein-calorie malnutrition      PAST MEDICAL HISTORY: Past Medical History  Diagnosis Date  . Hypertension   . Hyperlipemia   . Deaf-mutism   . Glaucoma     Dr Eulah Pont  . Diabetes mellitus     Type II, sees Dr. Candie Chroman  . Allergy   . Asthma     sees Dr [place]  . Stroke 11-05-10    sees Dr. Pearlean Brownie  . Chronic kidney disease     sees Dr. Camille Bal   . PVD (peripheral vascular disease)     sees Dr. Hart [place]   . PAD (peripheral artery disease)     DISCHARGE MEDICATIONS: Current Discharge Medication List    START taking these medications   Details  collagenase (SANTYL) ointment Apply topically daily. Apply to right heel and sacral ulcer daily. Refills: 0    insulin aspart (NOVOLOG) 100 UNIT/ML injection Inject 0-15 Units into the skin every 4 (four) hours. 0-15 Units, Subcutaneous, Every 4 hours (6 times per day) CBG < 70: implement hypoglycemia protocol CBG 70 - 120: 0 units CBG 121 - 150: 2 units CBG 151 - 200: 3 units CBG 201 - 250: 5 units CBG 251 - 300: 8  units CBG 301 - 350: 11 units CBG 351 - 400: 15 units CBG > 400: call MD    Nutritional Supplements (FEEDING SUPPLEMENT, GLUCERNA 1.2 CAL,) LIQD Place 1,000 mLs into feeding tube continuous. Initiate Glucerna @ 20 ml/hr via PEG and increase by 10 ml every 4 hours to goal rate of 60 ml/hr.    Water For Irrigation, Sterile (FREE WATER) SOLN Place 100 mLs into feeding tube every 8 (eight) hours.      CONTINUE these medications which have CHANGED   Details  atenolol (TENORMIN) 50 MG tablet Place 1 tablet (50 mg total) into feeding tube 2 (two) times daily.    atorvastatin (LIPITOR) 80 MG tablet Place 1 tablet (80 mg total) into feeding tube daily.    citalopram (CELEXA) 10 MG tablet Place 2 tablets (20 mg total) into feeding tube daily.    clopidogrel (PLAVIX) 75 MG tablet Take 1 tablet (75 mg total) by mouth daily.    docusate sodium (COLACE) 100 MG capsule Take 1 capsule (100 mg total) by mouth every morning. Refills: 0    ferrous sulfate 325 (65 FE) MG tablet Take 1 tablet (325 mg total) by mouth every morning.    hydrALAZINE (APRESOLINE) 50 MG tablet Place 1 tablet (50 mg total) into feeding tube 2 (two) times daily.    HYDROcodone-acetaminophen (NORCO/VICODIN) 5-325 MG per tablet Place 1 tablet into feeding  tube every 6 (six) hours. Qty: 30 tablet, Refills: 0    montelukast (SINGULAIR) 10 MG tablet Place 1 tablet (10 mg total) into feeding tube at bedtime.    polyethylene glycol (MIRALAX / GLYCOLAX) packet Place 17 g into feeding tube every morning.    pregabalin (LYRICA) 75 MG capsule Place 1 capsule (75 mg total) into feeding tube 2 (two) times daily.    Vitamin D, Ergocalciferol, (DRISDOL) 50000 UNITS CAPS capsule Place 1 capsule (50,000 Units total) into feeding tube every 30 (thirty) days.      CONTINUE these medications which have NOT CHANGED   Details  albuterol (PROVENTIL HFA;VENTOLIN HFA) 108 (90 BASE) MCG/ACT inhaler Inhale 2 puffs into the lungs every 6 (six)  hours as needed for wheezing or shortness of breath.    azelastine (ASTELIN) 137 MCG/SPRAY nasal spray Place 1 spray into both nostrils 2 (two) times daily. Use in each nostril as directed Qty: 90 mL, Refills: 3    budesonide-formoterol (SYMBICORT) 160-4.5 MCG/ACT inhaler Inhale 2 puffs into the lungs 2 (two) times daily. Qty: 3 Inhaler, Refills: 3    fluticasone (FLONASE) 50 MCG/ACT nasal spray Place 2 sprays into both nostrils daily. Qty: 48 g, Refills: 3    insulin glargine (LANTUS) 100 UNIT/ML injection Inject 0.2 mLs (20 Units total) into the skin at bedtime. Qty: 10 mL, Refills: 10    ipratropium-albuterol (DUONEB) 0.5-2.5 (3) MG/3ML SOLN Take 3 mLs by nebulization every 4 (four) hours.     latanoprost (XALATAN) 0.005 % ophthalmic solution Place 1 drop into both eyes at bedtime.      saccharomyces boulardii (FLORASTOR) 250 MG capsule Take 250 mg by mouth 2 (two) times daily.      STOP taking these medications     albuterol-ipratropium (COMBIVENT) 18-103 MCG/ACT inhaler      amoxicillin-clavulanate (AUGMENTIN) 875-125 MG per tablet      enalapril (VASOTEC) 2.5 MG tablet      Fluticasone Furoate-Vilanterol 100-25 MCG/INH AEPB      furosemide (LASIX) 20 MG tablet      Insulin Detemir (LEVEMIR FLEXPEN) 100 UNIT/ML Pen      insulin lispro (HUMALOG) 100 UNIT/ML KiwkPen      pantoprazole (PROTONIX) 40 MG tablet         ALLERGIES:  No Known Allergies  BRIEF HPI:  See H&P, Labs, Consult and Test reports for all details in brief, patient is a 64 year old female who is mute/deaf-presented to the ED unresponsive, she was found to be hypoxic, she was admitted to the critical care unit. She required intubation and mechanical ventilation.  CONSULTATIONS:   pulmonary/intensive care and IR  PERTINENT RADIOLOGIC STUDIES: Dg Chest Port 1 View  05/10/2015   CLINICAL DATA:  Check endotracheal tube position  EXAM: PORTABLE CHEST - 1 VIEW  COMPARISON:  Radiograph 05/09/2015   FINDINGS: Endotrachea tube is 4 cm from carina. NG tube extends into the stomach. Interval improvement in aeration lung bases. Central venous congestion. No pneumothorax.  IMPRESSION: 1. Endotracheal tube appears in good position. 2. Increased aeration to lung bases.   Electronically Signed   By: Genevive Bi M.D.   On: 05/10/2015 16:48   Dg Chest Port 1 View  05/09/2015   CLINICAL DATA:  Acute respiratory failure  EXAM: PORTABLE CHEST - 1 VIEW  COMPARISON:  05/07/2015  FINDINGS: Cardiac shadow is stable. Patient is somewhat rotated to the right. The overall inspiratory effort is poor. No focal confluent infiltrate is seen. An endotracheal tube and nasogastric catheter  are again seen and stable. No acute bony abnormality is noted.  IMPRESSION: Poor inspiratory effort without acute abnormality.   Electronically Signed   By: Alcide Clever M.D.   On: 05/09/2015 07:29   Dg Chest Port 1 View  05/07/2015   CLINICAL DATA:  Recent endotracheal readjustment  EXAM: PORTABLE CHEST - 1 VIEW  COMPARISON:  04/27/2015  FINDINGS: The endotracheal tube now lies approximately 2 cm above the carina. A nasogastric catheter is seen within the stomach. Cardiac shadow is stable. Persistent right basilar atelectasis is noted. No other focal abnormality is noted.  IMPRESSION: Stable right basilar atelectasis.  The endotracheal tube as described.   Electronically Signed   By: Alcide Clever M.D.   On: 05/07/2015 11:14   Dg Chest Port 1 View  05/07/2015   CLINICAL DATA:  Check endotracheal tube placement 05/07/2015  EXAM: PORTABLE CHEST - 1 VIEW  COMPARISON:  None.  FINDINGS: Endotracheal tube is again noted 12 mm above the carina and should be withdrawn 2 cm. A nasogastric catheter is noted within the stomach although the proximal side port lies in the distal esophagus. Elevation of the right hemidiaphragm with right basilar atelectasis is noted. The cardiac shadow is stable.  IMPRESSION: Endotracheal tube still 12 mm above the  carina. This should be withdrawn approximately 2 cm.  Nasogastric catheter as described.  Increasing right basilar atelectasis.   Electronically Signed   By: Alcide Clever M.D.   On: 05/07/2015 09:06   Dg Chest Port 1 View  05/07/2015   CLINICAL DATA:  Endotracheal tube placement.  Initial encounter.  EXAM: PORTABLE CHEST - 1 VIEW  COMPARISON:  Chest radiograph performed earlier today at 3:01 a.m.  FINDINGS: The patient's endotracheal tube is noted extending just below the carina, overlying the right mainstem bronchus. This could be retracted approximately 3 cm. The enteric tube is noted extending below the diaphragm, with the side port noted about the distal esophagus. This could be advanced 8 cm, as deemed clinically appropriate.  The lungs are hypoexpanded. Right perihilar airspace opacity may reflect atelectasis, with mild right-sided volume loss noted. No pleural effusion or pneumothorax is seen.  The cardiomediastinal silhouette is borderline normal in size. No acute osseous abnormalities identified.  IMPRESSION: 1. Endotracheal tube seen ending just below the carina, overlying the right mainstem bronchus. This could be retracted approximately 3 cm. 2. Enteric tube seen extending below the diaphragm, with the side port about the distal esophagus. This could be advanced 8 cm, is deemed clinically appropriate. 3. Lungs hypoexpanded. Right perihilar airspace opacity may reflect atelectasis, with mild right-sided volume loss noted. These results were called by telephone at the time of interpretation on 05/07/2015 at 5:48 am to Dr. Loren Racer, who verbally acknowledged these results.   Electronically Signed   By: Roanna Raider M.D.   On: 05/07/2015 05:48   Dg Chest Port 1 View  05/07/2015   CLINICAL DATA:  Altered mental status, acute onset. Initial encounter.  EXAM: PORTABLE CHEST - 1 VIEW  COMPARISON:  Chest radiograph performed 04/08/2015  FINDINGS: The lungs remain hypoexpanded. Left basilar airspace  opacity may reflect atelectasis or possibly mild pneumonia. No definite pleural effusion or pneumothorax is seen.  The cardiomediastinal silhouette is enlarged. No acute osseous abnormalities are identified.  IMPRESSION: Lungs remain hypoexpanded. Left basilar airspace opacity may reflect atelectasis or possibly mild pneumonia. Cardiomegaly noted.   Electronically Signed   By: Roanna Raider M.D.   On: 05/07/2015 03:44   Dg  Abd Portable 1v  05/09/2015   CLINICAL DATA:  Ileus. Abdominal distention. Nasogastric tube placement  EXAM: PORTABLE ABDOMEN - 1 VIEW  COMPARISON:  05/07/2015  FINDINGS: A nasogastric tube remains in place with tip again seen overlying the expected location of the transverse duodenum. Generalized gaseous distention of colon is again demonstrated, consistent with mild ileus. No dilated small bowel loops demonstrated.  IMPRESSION: Nasogastric tube tip again seen overlying the expected location of the transverse duodenum.  Persistent colonic distention, suspicious for ileus.   Electronically Signed   By: Myles Rosenthal M.D.   On: 05/09/2015 15:33   Dg Abd Portable 1v  05/07/2015   CLINICAL DATA:  Orogastric tube position  EXAM: PORTABLE ABDOMEN - 1 VIEW  COMPARISON:  Chest radiograph same date  FINDINGS: Tip of orogastric tube terminates over the expected location of the third portion of the duodenum. Right upper quadrant clips are noted. Normal bowel gas pattern.  IMPRESSION: Feeding tube tip terminates over the expected location of the third portion of the duodenum.   Electronically Signed   By: Christiana Pellant M.D.   On: 05/07/2015 11:19     PERTINENT LAB RESULTS: CBC: No results for input(s): WBC, HGB, HCT, PLT in the last 72 hours. CMET CMP     Component Value Date/Time   NA 136 05/18/2015 0605   K 3.5 05/18/2015 0605   CL 105 05/18/2015 0605   CO2 24 05/18/2015 0605   GLUCOSE 206* 05/18/2015 0605   BUN 7 05/18/2015 0605   CREATININE 0.89 05/18/2015 0605   CALCIUM 9.0  05/18/2015 0605   PROT 7.2 05/07/2015 0257   ALBUMIN 2.6* 05/07/2015 0257   AST 51* 05/07/2015 0257   ALT 18 05/07/2015 0257   ALKPHOS 61 05/07/2015 0257   BILITOT 0.7 05/07/2015 0257   GFRNONAA >60 05/18/2015 0605   GFRAA >60 05/18/2015 0605    GFR Estimated Creatinine Clearance: 65 mL/min (by C-G formula based on Cr of 0.89). No results for input(s): LIPASE, AMYLASE in the last 72 hours. No results for input(s): CKTOTAL, CKMB, CKMBINDEX, TROPONINI in the last 72 hours. Invalid input(s): POCBNP No results for input(s): DDIMER in the last 72 hours. No results for input(s): HGBA1C in the last 72 hours. No results for input(s): CHOL, HDL, LDLCALC, TRIG, CHOLHDL, LDLDIRECT in the last 72 hours. No results for input(s): TSH, T4TOTAL, T3FREE, THYROIDAB in the last 72 hours.  Invalid input(s): FREET3 No results for input(s): VITAMINB12, FOLATE, FERRITIN, TIBC, IRON, RETICCTPCT in the last 72 hours. Coags: No results for input(s): INR in the last 72 hours.  Invalid input(s): PT Microbiology: No results found for this or any previous visit (from the past 240 hour(s)).   BRIEF HOSPITAL COURSE:  Acute respiratory failure with hypoxia: Resolved.Secondary to aspiration pneumonia, COPD exacerbation. Required endotracheal intubation on admission. Completed IV antibiotics treatment on 8/2.On just 2L of O2 via [place].  Active Problems: Healthcare associated pneumonia/aspiration pneumonia: Presented with respiratory failure requiring endotracheal intubation on admission. Required broad-spectrum antibiotics therapy, has completed antibiotics treatment on 8/2. Blood cultures negative. Remains afebrile without leukocytosis.   ? COPD exacerbation: Resolved. Completed steroids treatment. Continue nebulized bronchodilators.  Known history of dysphagia: Underwent extensive evaluation including FEES. Cautiously continue with a dysphagia 1 diet-spoke with daughter [name] over the phone on 8/3-family  accepting all risks. They would like patient to be on a dys 1 diet for comfort even after a peg tube is place. IR consulted, PEG tube placement done on 8/5, PEG tube  feeding started on 8/6.  Hyponatremia: Likely secondary to dehydration. Resolved with IV fluids  Acute renal failure: Likely prerenal azotemia, resolved with IV fluids  Hypertension: Controlled-Continue hydralazine, atenolol  Type 2 diabetes: CBGs moderately controlled-continue Lantus 20 units along with SSI. Allow some permissive hyperglycemia-very frail patient   History of CVA: Continue Plavix once PEG tube placed-Plavix was temporarily held for PEG tube placement. Continue statin on discharge   Generalized weakness, deaf-mute.   Palliative care:DNR. Family aware that patient will continue to aspirate irrespective of PEG tube. Please make sure patient has palliative care follow-up while at SNF. Poor long term prognoses  TODAY-DAY OF DISCHARGE:  Subjective:   Valerie Rodriguez today has no headache,no chest abdominal pain,no new weakness tingling or numbness  Objective:   Blood pressure 142/45, pulse 64, temperature 97.7 F (36.5 C), temperature source Oral, resp. rate 19, height 5\' 1"  (1.549 m), weight 87.4 kg (192 lb 10.9 oz), SpO2 100 %.  Intake/Output Summary (Last 24 hours) at 05/21/15 1714 Last data filed at 05/21/15 1358  Gross per 24 hour  Intake     60 ml  Output   1100 ml  Net  -1040 ml   Filed Weights   05/10/15 0402 05/10/15 0404 05/11/15 0401  Weight: 89.6 kg (197 lb 8.5 oz) 89.6 kg (197 lb 8.5 oz) 87.4 kg (192 lb 10.9 oz)    Exam Gen Exam: Awake, deaf/mute-acknowledges interpreter much more today. Shakes head yes/no Neck: Supple, No JVD.  Chest: B/L Clear anteriorly CVS: S1 S2 Regular, no murmurs.  Abdomen: soft, BS +, non tender, non distended.  Extremities: no edema, lower extremities warm to touch. Neurologic: Non Focal-but with generalized weakness  DISCHARGE  CONDITION: Stable  DISPOSITION: SNF  DISCHARGE INSTRUCTIONS:    Activity:  As tolerated with Full fall precautions use walker/cane & assistance as needed*  Diet recommendation: Dysphagia 1-for comfort purposes only-has peg tube in place Aspiration precautions:yes  Discharge Instructions    Call MD for:  persistant nausea and vomiting    Complete by:  As directed      Call MD for:  severe uncontrolled pain    Complete by:  As directed      Increase activity slowly    Complete by:  As directed            Follow-up Information    Schedule an appointment as soon as possible for a visit in 3 days to follow up.   Contact information:   Primary MD at SNF      Total Time spent on discharge equals 45 minutes.  SignedJeoffrey Massed 05/21/2015 5:14 PM

## 2015-05-21 NOTE — Progress Notes (Signed)
Patient daughter De Burrs notifed of patient discharge to [facility].

## 2015-05-21 NOTE — Progress Notes (Signed)
Daughter Vanita Ingles and sister notified of the canceled discharge to [facility]. Possible another facility 05-22-15 or [facility] on 05-23-15.

## 2015-05-21 NOTE — Progress Notes (Signed)
S/p perc G-tube placement yesterday No complications Tube intact, site clean, dry May begin using today ~1200 for TF Please contact IR dept with questions or concerns regarding G-tube.  Brayton El PA-C Interventional Radiology 05/21/2015 8:46 AM

## 2015-05-22 LAB — GLUCOSE, CAPILLARY
Glucose-Capillary: 137 mg/dL — ABNORMAL HIGH (ref 65–99)
Glucose-Capillary: 139 mg/dL — ABNORMAL HIGH (ref 65–99)
Glucose-Capillary: 156 mg/dL — ABNORMAL HIGH (ref 65–99)
Glucose-Capillary: 173 mg/dL — ABNORMAL HIGH (ref 65–99)

## 2015-05-22 NOTE — Plan of Care (Signed)
[facility]                                                                                                                                                                         Patient Demographics  Valerie Rodriguez, is a 64 y.o. female  GNF:[id number]  QIO:[id number]  DOB - 1951/03/31  Admit date - 05/07/2015  Admitting Physician Leslye Peer, MD  Outpatient Primary MD for the patient is No PCP Per Patient  LOS - 15   Chief Complaint  Patient presents with  . Altered Mental Status  . Fever        Assessment & Plan    Patient seen briefly today due for discharge soon per Discharge done yesterday by Dr Jerral Ralph, no further issues, Vital signs stable, patient looks comfortable. 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 PEG tube placed, tube feedings resumed. To be discharged to SNF.    Medications  Scheduled Meds: . antiseptic oral rinse  7 mL Mouth Rinse QID  . atenolol  50 mg Per Tube BID  . atorvastatin  80 mg Per Tube QPM  . chlorhexidine  15 mL Mouth Rinse BID  . citalopram  20 mg Per Tube Daily  . clopidogrel  75 mg Per Tube Daily  . collagenase   Topical Daily  . free water  100 mL Per Tube 3 times per day  . hydrALAZINE  25 mg Per Tube 3 times per day  . HYDROcodone-acetaminophen  1 tablet Per Tube 4 times per day  . insulin aspart  0-15 Units Subcutaneous 6 times per day  . insulin glargine  20 Units Subcutaneous Daily  . latanoprost  1 drop Both Eyes QHS  . pregabalin  75 mg Oral BID  . saccharomyces boulardii  250 mg Oral BID   Continuous Infusions: . feeding supplement (GLUCERNA 1.2 CAL) 1,000 mL (05/22/15 0800)   PRN Meds:.sodium chloride, dextrose, hydrALAZINE, ipratropium-albuterol, labetalol, morphine injection, potassium chloride    Time Spent in minutes   10 minutes   Susa Raring K M.D on 05/22/2015 at 9:54 AM  Between 7am to 7pm - Pager - [phone]  After 7pm go to  [email or web address] - password [email or web address]  And look for the night coverage person covering for me after hours  [facility] Office  [phone]    Subjective:   Valerie Rodriguez today remains nonverbal, in no distress  Objective:   Filed Vitals:   05/21/15 1349 05/21/15 2130 05/22/15 0551 05/22/15 0806  BP: 142/45 132/48 148/45 124/44  Pulse: 64 64 72 73  Temp: 97.7 F (36.5 C) 98.9 F (37.2 C) 98.4 F (36.9 C) 98.4 F (36.9 C)  TempSrc: Oral Oral Oral Oral  Resp: 19 18 19  18  Height:      Weight:      SpO2: 100% 99% 100% 100%    Wt Readings from Last 3 Encounters:  05/11/15 87.4 kg (192 lb 10.9 oz)  04/12/15 89 kg (196 lb 3.4 oz)  03/26/15 98.2 kg (216 lb 7.9 oz)     Intake/Output Summary (Last 24 hours) at 05/22/15 0954 Last data filed at 05/22/15 0800  Gross per 24 hour  Intake    270 ml  Output   1200 ml  Net   -930 ml    Exam Awake Alert, minimal response to verbal stimuli which is her baseline Fontan[place].AT,PERRAL Supple Neck,No JVD, No cervical lymphadenopathy appriciated.  Symmetrical Chest wall movement, Good air movement bilaterally, CTAB RRR,No Gallops,Rubs or new Murmurs, No Parasternal Heave +ve B.Sounds, Abd Soft, Non tender, No organomegaly appriciated, No rebound - guarding or rigidity. PEG tube site stable No Cyanosis, Clubbing or edema, No new Rash or bruise

## 2015-05-22 NOTE — Progress Notes (Signed)
Report called to [facility]. S/W Stacy,RN. All questions answered. Barbera Setters RN

## 2015-05-22 NOTE — Social Work (Signed)
Facility called and stated that they were able to obtain a pump for patient to be admitted. CSW contacted RN on unit to make her aware. CSW contacted transportation to transport patient to facility.  Beverly Sessions MSW, LCSW  [phone]

## 2015-07-22 ENCOUNTER — Other Ambulatory Visit: Payer: Self-pay

## 2015-07-27 ENCOUNTER — Other Ambulatory Visit (HOSPITAL_COMMUNITY): Payer: Self-pay | Admitting: Internal Medicine

## 2015-07-27 DIAGNOSIS — R1310 Dysphagia, unspecified: Secondary | ICD-10-CM

## 2015-08-01 ENCOUNTER — Other Ambulatory Visit: Payer: Self-pay

## 2015-08-01 DIAGNOSIS — Z1231 Encounter for screening mammogram for malignant neoplasm of breast: Secondary | ICD-10-CM

## 2015-08-02 ENCOUNTER — Ambulatory Visit (HOSPITAL_COMMUNITY)
Admission: RE | Admit: 2015-08-02 | Discharge: 2015-08-02 | Disposition: A | Discharge status: Home / Self Care | Payer: Medicare Other | Source: Ambulatory Visit | Attending: Internal Medicine | Admitting: Internal Medicine

## 2015-08-02 DIAGNOSIS — Z8673 Personal history of transient ischemic attack (TIA), and cerebral infarction without residual deficits: Secondary | ICD-10-CM | POA: Insufficient documentation

## 2015-08-02 DIAGNOSIS — R4701 Aphasia: Secondary | ICD-10-CM | POA: Insufficient documentation

## 2015-08-02 DIAGNOSIS — I739 Peripheral vascular disease, unspecified: Secondary | ICD-10-CM | POA: Insufficient documentation

## 2015-08-02 DIAGNOSIS — E119 Type 2 diabetes mellitus without complications: Secondary | ICD-10-CM | POA: Diagnosis not present

## 2015-08-02 DIAGNOSIS — R1312 Dysphagia, oropharyngeal phase: Secondary | ICD-10-CM | POA: Insufficient documentation

## 2015-08-02 DIAGNOSIS — R1310 Dysphagia, unspecified: Secondary | ICD-10-CM | POA: Diagnosis present

## 2015-08-02 DIAGNOSIS — N189 Chronic kidney disease, unspecified: Secondary | ICD-10-CM | POA: Diagnosis not present

## 2015-08-02 DIAGNOSIS — H409 Unspecified glaucoma: Secondary | ICD-10-CM | POA: Diagnosis not present

## 2015-08-02 DIAGNOSIS — E785 Hyperlipidemia, unspecified: Secondary | ICD-10-CM | POA: Diagnosis not present

## 2015-08-02 DIAGNOSIS — J45909 Unspecified asthma, uncomplicated: Secondary | ICD-10-CM | POA: Diagnosis not present

## 2015-08-02 DIAGNOSIS — I129 Hypertensive chronic kidney disease with stage 1 through stage 4 chronic kidney disease, or unspecified chronic kidney disease: Secondary | ICD-10-CM | POA: Insufficient documentation

## 2015-08-30 ENCOUNTER — Ambulatory Visit: Payer: Medicare Other

## 2015-09-15 DEATH — deceased

## 2017-02-12 IMAGING — DX DG CHEST 2V
2 series · 2 of 2 positions shown · non-contrast
Comparison: None.

CLINICAL DATA: Chest pain shortness of breath. Bilateral lower
extremity an abdominal swelling.

EXAM:
CHEST - 2 VIEW

[chest lat]
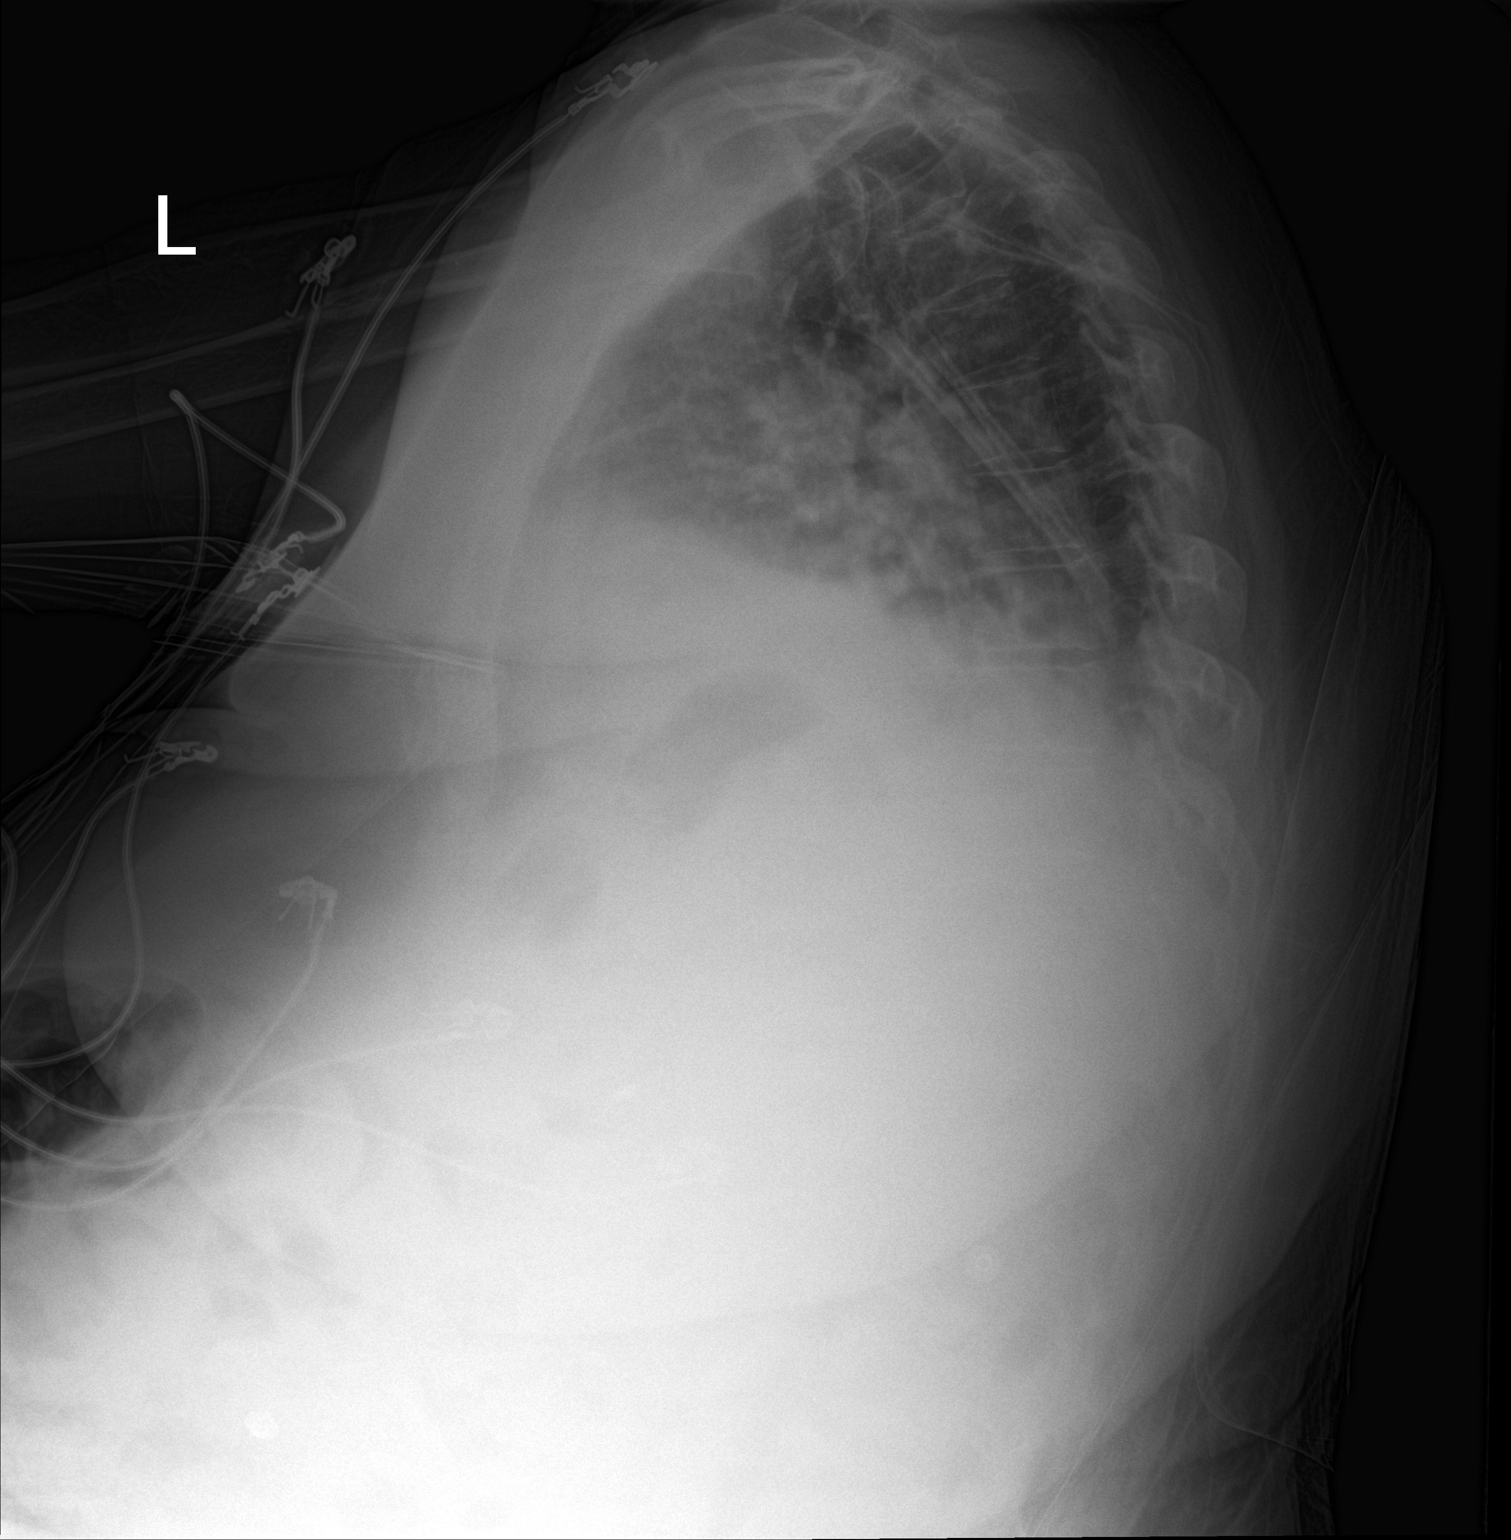

[chest ap]
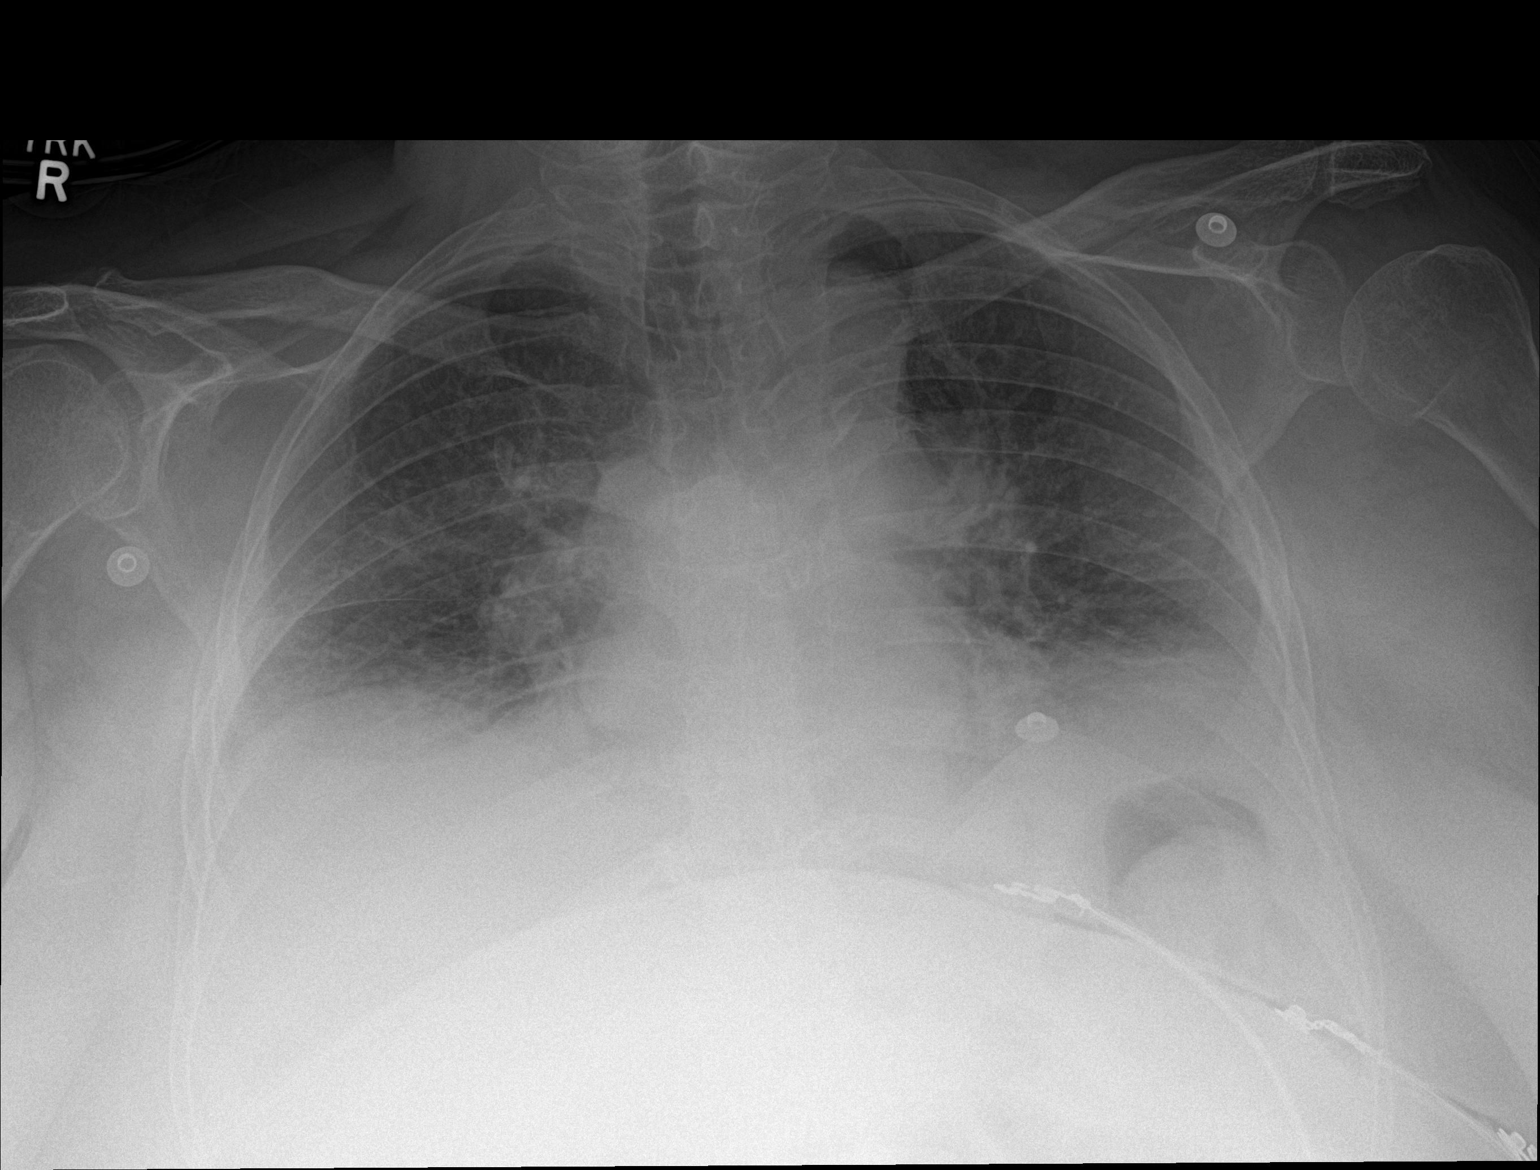

[2 of 2 positions shown; findings below may reference images not displayed]

FINDINGS: The heart is enlarged. Pulmonary vascular congestion and
interstitial edema has increased. Bibasilar airspace disease is
noted. The visualized soft tissues and bony thorax are unremarkable.
IMPRESSION: 1. Cardiomegaly and moderate pulmonary vascular congestion
suggesting congestive heart failure.
2. Bibasilar airspace disease likely reflects acute atelectasis.
Early infection is also considered.
